# Patient Record
Sex: Male | Born: 1935
Health system: Southern US, Community
[De-identification: ages and names within clinical notes are randomized; demographics above are authoritative.]

## PROBLEM LIST (undated history)

## (undated) DIAGNOSIS — I10 Essential (primary) hypertension: Secondary | ICD-10-CM

## (undated) DIAGNOSIS — K219 Gastro-esophageal reflux disease without esophagitis: Secondary | ICD-10-CM

## (undated) DIAGNOSIS — F0390 Unspecified dementia without behavioral disturbance: Secondary | ICD-10-CM

## (undated) DIAGNOSIS — C8313 Mantle cell lymphoma, intra-abdominal lymph nodes: Principal | ICD-10-CM

## (undated) DIAGNOSIS — R319 Hematuria, unspecified: Principal | ICD-10-CM

## (undated) HISTORY — DX: Essential (primary) hypertension: I10

## (undated) HISTORY — DX: Unspecified dementia, unspecified severity, without behavioral disturbance, psychotic disturbance, mood disturbance, and anxiety: F03.90

## (undated) HISTORY — DX: Gastro-esophageal reflux disease without esophagitis: K21.9

## (undated) HISTORY — DX: Mantle cell lymphoma, intra-abdominal lymph nodes: C83.13

## (undated) HISTORY — DX: Hematuria, unspecified: R31.9

---

## 1998-03-18 ENCOUNTER — Other Ambulatory Visit: Admission: RE | Admit: 1998-03-18 | Discharge: 1998-03-18 | Payer: Self-pay | Admitting: Family Medicine

## 2006-11-26 HISTORY — PX: REPLACEMENT TOTAL KNEE: SUR1224

## 2011-06-15 ENCOUNTER — Encounter: Payer: Self-pay | Admitting: Oncology

## 2011-07-25 ENCOUNTER — Other Ambulatory Visit: Payer: Self-pay | Admitting: Oncology

## 2011-07-25 ENCOUNTER — Encounter (HOSPITAL_BASED_OUTPATIENT_CLINIC_OR_DEPARTMENT_OTHER): Payer: Medicare Other | Admitting: Oncology

## 2011-07-25 ENCOUNTER — Encounter: Payer: No Typology Code available for payment source | Admitting: Oncology

## 2011-07-25 DIAGNOSIS — N400 Enlarged prostate without lower urinary tract symptoms: Secondary | ICD-10-CM

## 2011-07-25 DIAGNOSIS — C8319 Mantle cell lymphoma, extranodal and solid organ sites: Secondary | ICD-10-CM

## 2011-07-25 LAB — CBC WITH DIFFERENTIAL/PLATELET
BASO%: 0.4 % (ref 0.0–2.0)
HCT: 43.2 % (ref 38.4–49.9)
LYMPH%: 22.4 % (ref 14.0–49.0)
MCH: 33.3 pg (ref 27.2–33.4)
MCHC: 35.3 g/dL (ref 32.0–36.0)
MCV: 94.5 fL (ref 79.3–98.0)
MONO#: 0.4 10*3/uL (ref 0.1–0.9)
MONO%: 7.2 % (ref 0.0–14.0)
NEUT%: 68.9 % (ref 39.0–75.0)
Platelets: 135 10*3/uL — ABNORMAL LOW (ref 140–400)

## 2011-07-25 LAB — MORPHOLOGY: PLT EST: DECREASED

## 2011-07-26 LAB — COMPREHENSIVE METABOLIC PANEL
AST: 19 U/L (ref 0–37)
BUN: 19 mg/dL (ref 6–23)
Calcium: 8.6 mg/dL (ref 8.4–10.5)
Chloride: 105 mEq/L (ref 96–112)
Creatinine, Ser: 0.98 mg/dL (ref 0.50–1.35)

## 2011-07-26 LAB — LACTATE DEHYDROGENASE: LDH: 141 U/L (ref 94–250)

## 2011-10-02 LAB — COMPREHENSIVE METABOLIC PANEL
ALT: 15 U/L (ref 0–53)
CO2: 28 mEq/L (ref 19–32)
Calcium: 8.6 mg/dL (ref 8.4–10.5)
Chloride: 105 mEq/L (ref 96–112)
Creatinine, Ser: 0.98 mg/dL (ref 0.50–1.35)
Total Protein: 5.8 g/dL — ABNORMAL LOW (ref 6.0–8.3)

## 2011-10-02 LAB — LACTATE DEHYDROGENASE: LDH: 141 U/L (ref 94–250)

## 2011-10-02 LAB — URIC ACID: Uric Acid, Serum: 5.5 mg/dL (ref 4.0–7.8)

## 2011-12-10 ENCOUNTER — Telehealth: Payer: Self-pay | Admitting: Oncology

## 2011-12-10 NOTE — Telephone Encounter (Signed)
S/w the pt's dtr and she is aware of the April 2013 appts.

## 2011-12-13 ENCOUNTER — Telehealth: Payer: Self-pay | Admitting: Oncology

## 2011-12-13 ENCOUNTER — Telehealth: Payer: Self-pay | Admitting: *Deleted

## 2011-12-13 ENCOUNTER — Other Ambulatory Visit: Payer: Self-pay | Admitting: Oncology

## 2011-12-13 DIAGNOSIS — C8319 Mantle cell lymphoma, extranodal and solid organ sites: Secondary | ICD-10-CM

## 2011-12-13 NOTE — Telephone Encounter (Signed)
Talked to pt's wife gave her appt for lab and CT, requested her to pick up oral contrast before Ct. Also informed pt's to be NPO 4 hrs prior to scan. Then pt will see MD after a few days

## 2011-12-13 NOTE — Telephone Encounter (Signed)
Received call from pt's wife on 12/11/11 but unable to find pt in computer b/c didn't have correct spelling on name.  She called back today & states that someone called them with appts for Aug. But she thinks the pt needs some x-rays.  They were supposed to have CT scan but stated that she went to radiology & someone told them that it couldn't be done b/c it wasn't ordered in the new system.  Discussed with Rose/Scheduler & she will get this scheduled before MD appt & call pt.

## 2012-01-31 DIAGNOSIS — C8589 Other specified types of non-Hodgkin lymphoma, extranodal and solid organ sites: Secondary | ICD-10-CM | POA: Diagnosis not present

## 2012-01-31 DIAGNOSIS — M549 Dorsalgia, unspecified: Secondary | ICD-10-CM | POA: Diagnosis not present

## 2012-02-27 ENCOUNTER — Other Ambulatory Visit (HOSPITAL_BASED_OUTPATIENT_CLINIC_OR_DEPARTMENT_OTHER): Payer: Medicare Other | Admitting: Lab

## 2012-02-27 ENCOUNTER — Ambulatory Visit (HOSPITAL_COMMUNITY)
Admission: RE | Admit: 2012-02-27 | Discharge: 2012-02-27 | Disposition: A | Discharge status: Home / Self Care | Payer: Medicare Other | Source: Ambulatory Visit | Attending: Oncology | Admitting: Oncology

## 2012-02-27 DIAGNOSIS — C8319 Mantle cell lymphoma, extranodal and solid organ sites: Secondary | ICD-10-CM

## 2012-02-27 DIAGNOSIS — K449 Diaphragmatic hernia without obstruction or gangrene: Secondary | ICD-10-CM | POA: Diagnosis not present

## 2012-02-27 DIAGNOSIS — I2510 Atherosclerotic heart disease of native coronary artery without angina pectoris: Secondary | ICD-10-CM | POA: Insufficient documentation

## 2012-02-27 DIAGNOSIS — K5730 Diverticulosis of large intestine without perforation or abscess without bleeding: Secondary | ICD-10-CM | POA: Diagnosis not present

## 2012-02-27 DIAGNOSIS — K7689 Other specified diseases of liver: Secondary | ICD-10-CM | POA: Diagnosis not present

## 2012-02-27 DIAGNOSIS — I7090 Unspecified atherosclerosis: Secondary | ICD-10-CM | POA: Diagnosis not present

## 2012-02-27 LAB — CMP (CANCER CENTER ONLY)
ALT(SGPT): 26 U/L (ref 10–47)
Albumin: 3.7 g/dL (ref 3.3–5.5)
CO2: 30 mEq/L (ref 18–33)
Calcium: 8.7 mg/dL (ref 8.0–10.3)
Chloride: 102 mEq/L (ref 98–108)
Glucose, Bld: 96 mg/dL (ref 73–118)
Potassium: 4.4 mEq/L (ref 3.3–4.7)
Sodium: 145 mEq/L (ref 128–145)
Total Protein: 6.8 g/dL (ref 6.4–8.1)

## 2012-02-27 LAB — CBC WITH DIFFERENTIAL/PLATELET
Basophils Absolute: 0 10*3/uL (ref 0.0–0.1)
Eosinophils Absolute: 0.1 10*3/uL (ref 0.0–0.5)
HGB: 15.8 g/dL (ref 13.0–17.1)
MCV: 93.7 fL (ref 79.3–98.0)
MONO#: 0.4 10*3/uL (ref 0.1–0.9)
MONO%: 8.2 % (ref 0.0–14.0)
NEUT#: 3.3 10*3/uL (ref 1.5–6.5)
RDW: 13.5 % (ref 11.0–14.6)
WBC: 4.6 10*3/uL (ref 4.0–10.3)
lymph#: 0.8 10*3/uL — ABNORMAL LOW (ref 0.9–3.3)

## 2012-02-27 MED ORDER — IOHEXOL 300 MG/ML  SOLN
100.0000 mL | Freq: Once | INTRAMUSCULAR | Status: AC | PRN
  Administered 2012-02-27: 100 mL via INTRAVENOUS

## 2012-02-28 ENCOUNTER — Telehealth: Payer: Self-pay

## 2012-02-28 LAB — LACTATE DEHYDROGENASE: LDH: 178 U/L (ref 94–250)

## 2012-02-28 LAB — SEDIMENTATION RATE: Sed Rate: 1 mm/hr (ref 0–16)

## 2012-02-28 NOTE — Telephone Encounter (Signed)
Message copied by Johny Drilling on Thu Feb 28, 2012  3:26 PM ------      Message from: Annia Belt      Created: Wed Feb 27, 2012  4:38 PM       Call pt CT negative for recurrent lymphoma

## 2012-02-28 NOTE — Telephone Encounter (Signed)
Pt notified of CT results per Dr Beryle Beams.  Pt expresses appreciation. dph

## 2012-03-04 ENCOUNTER — Encounter: Payer: Self-pay | Admitting: Oncology

## 2012-03-04 ENCOUNTER — Other Ambulatory Visit: Payer: No Typology Code available for payment source | Admitting: Lab

## 2012-03-04 ENCOUNTER — Ambulatory Visit (HOSPITAL_BASED_OUTPATIENT_CLINIC_OR_DEPARTMENT_OTHER): Payer: Medicare Other | Admitting: Oncology

## 2012-03-04 VITALS — BP 121/72 | HR 64 | Temp 97.1°F | Ht 71.5 in | Wt 161.7 lb

## 2012-03-04 DIAGNOSIS — C8313 Mantle cell lymphoma, intra-abdominal lymph nodes: Secondary | ICD-10-CM | POA: Diagnosis not present

## 2012-03-04 HISTORY — DX: Mantle cell lymphoma, intra-abdominal lymph nodes: C83.13

## 2012-03-04 NOTE — Progress Notes (Signed)
Hematology and Oncology Follow Up Visit  Jeffrey Simmons [id number] October 21, 1927 78 y.o. 03/04/2012 4:11 PM   Principle Diagnosis: Encounter Diagnosis  Name Primary?  . Mantle cell lymphoma of intra-abdominal lymph nodes Yes     Interim History:   Followup visit for this pleasant 78 year old man with mantle cell lymphoma in remission. Initial diagnosis in February of 2010 when he presented with increasing fatigue and abdominal fullness. CT scan showed splenomegaly, periportal lymphadenopathy, bilateral iliac adenopathy, and a dominant lymph node mass in the right pelvis adjacent to the right external iliac vessels measuring 4.8 x 2.3 cm. Additional right inguinal adenopathy 2.9 x 2.6 cm. Diagnosis established by right inguinal lymph node biopsy done 02/01/2009 which showed a CD5 positive large B-cell lymphoma CD20 positive CD5 and CD10 positive lambda light chain restriction. Bone marrow with atypical nodular lymphoid aggregates. Cytogenetic studies did show a population of cells with the 11; 14 translocation consistent with mantle cell lymphoma. He was living in [place] at the time. He was treated with 4 cycles of Cytoxan vincristine prednisone and Rituxan followed by 4 cycles of Cytoxan Novantrone vincristine and prednisone through 07/12/2009. He achieved a complete response. He continues to do well. He has no constitutional symptoms. No interim infections. He has not noted any new areas of adenopathy.   Medications: reviewed  Allergies: No Known Allergies  Review of Systems: Constitutional:   See above Respiratory: No cough or dyspnea Cardiovascular:  No chest pain or palpitations Gastrointestinal: No change in bowel habit Genito-Urinary: Not questioned Musculoskeletal: No pain Neurologic no headache or change in vision: Skin: No rash Remaining ROS negative.  Physical Exam: Blood pressure 121/72, pulse 64, temperature 97.1 F (36.2 C), temperature source Oral, height 5'  11.5" (1.816 m), weight 161 lb 11.2 oz (73.347 kg). Wt Readings from Last 3 Encounters:  03/04/12 161 lb 11.2 oz (73.347 kg)     General appearance: A thin Caucasian man [name]: Pharynx no erythema exudate or mass Lymph nodes: No cervical supraclavicular axillary or inguinal adenopathy Breasts: Lungs: Clear to auscultation resonant to percussion Heart: Regular rhythm no murmur or gallop Abdomen: Soft nontender no mass no organomegaly Extremities: No edema no calf tenderness Vascular: No cyanosis Neurologic: Motor strength 5 over 5, reflexes 1+ symmetric, sensation intact to vibration over the fingertips Skin: No rash or ecchymoses  Lab Results: Lab Results  Component Value Date   WBC 4.6 02/27/2012   HGB 15.8 02/27/2012   HCT 45.7 02/27/2012   MCV 93.7 02/27/2012   PLT 123* 02/27/2012     Chemistry      Component Value Date/Time   NA 145 02/27/2012 1158   NA 141 07/25/2011 1438   NA 141 07/25/2011 1438   NA 141 07/25/2011 1438   NA 141 07/25/2011 1438   K 4.4 02/27/2012 1158   K 4.3 07/25/2011 1438   K 4.3 07/25/2011 1438   K 4.3 07/25/2011 1438   K 4.3 07/25/2011 1438   CL 102 02/27/2012 1158   CL 105 07/25/2011 1438   CL 105 07/25/2011 1438   CL 105 07/25/2011 1438   CL 105 07/25/2011 1438   CO2 30 02/27/2012 1158   CO2 28 07/25/2011 1438   CO2 28 07/25/2011 1438   CO2 28 07/25/2011 1438   CO2 28 07/25/2011 1438   BUN 14 02/27/2012 1158   BUN 19 07/25/2011 1438   BUN 19 07/25/2011 1438   BUN 19 07/25/2011 1438   BUN 19 07/25/2011 1438   CREATININE  1.0 02/27/2012 1158   CREATININE 0.98 07/25/2011 1438   CREATININE 0.98 07/25/2011 1438   CREATININE 0.98 07/25/2011 1438   CREATININE 0.98 07/25/2011 1438      Component Value Date/Time   CALCIUM 8.7 02/27/2012 1158   CALCIUM 8.6 07/25/2011 1438   CALCIUM 8.6 07/25/2011 1438   CALCIUM 8.6 07/25/2011 1438   CALCIUM 8.6 07/25/2011 1438   ALKPHOS 105* 02/27/2012 1158   ALKPHOS 88 07/25/2011 1438   ALKPHOS 88 07/25/2011 1438   ALKPHOS 88 07/25/2011 1438    ALKPHOS 88 07/25/2011 1438   AST 33 02/27/2012 1158   AST 19 07/25/2011 1438   AST 19 07/25/2011 1438   AST 19 07/25/2011 1438   AST 19 07/25/2011 1438   ALT 15 07/25/2011 1438   ALT 15 07/25/2011 1438   ALT 15 07/25/2011 1438   ALT 15 07/25/2011 1438   BILITOT 1.10 02/27/2012 1158   BILITOT 1.0 07/25/2011 1438   BILITOT 1.0 07/25/2011 1438   BILITOT 1.0 07/25/2011 1438   BILITOT 1.0 07/25/2011 1438       Radiological Studies: CT scans chest abdomen and pelvis which I personally reviewed done 02/27/2012 show no evidence for recurrent lymphoma   Impression and Plan: Stage IV mantle cell lymphoma treated as outlined above he remains in a complete clinical and radiographic remission now out 3 years from diagnosis Plan continued periodic followup.   CC:. Dr. Jori Moll to light; Dr. Dana Allan fax [phone]   Annia Belt, MD 4/9/20134:11 PM

## 2012-03-05 ENCOUNTER — Telehealth: Payer: Self-pay | Admitting: Oncology

## 2012-03-05 NOTE — Telephone Encounter (Signed)
gv pt appt for oct2013. pt question chest xray for oct if he needs it.  sen t email to MD for confirmation

## 2012-03-19 DIAGNOSIS — Q828 Other specified congenital malformations of skin: Secondary | ICD-10-CM | POA: Diagnosis not present

## 2012-03-19 DIAGNOSIS — L84 Corns and callosities: Secondary | ICD-10-CM | POA: Diagnosis not present

## 2012-04-18 DIAGNOSIS — H353190 Nonexudative age-related macular degeneration, unspecified eye, stage unspecified: Secondary | ICD-10-CM | POA: Diagnosis not present

## 2012-04-18 DIAGNOSIS — H35379 Puckering of macula, unspecified eye: Secondary | ICD-10-CM | POA: Diagnosis not present

## 2012-04-18 DIAGNOSIS — Z961 Presence of intraocular lens: Secondary | ICD-10-CM | POA: Diagnosis not present

## 2012-05-05 DIAGNOSIS — R413 Other amnesia: Secondary | ICD-10-CM | POA: Diagnosis not present

## 2012-05-05 DIAGNOSIS — Z0000 Encounter for general adult medical examination without abnormal findings: Secondary | ICD-10-CM | POA: Diagnosis not present

## 2012-05-05 DIAGNOSIS — C8589 Other specified types of non-Hodgkin lymphoma, extranodal and solid organ sites: Secondary | ICD-10-CM | POA: Diagnosis not present

## 2012-05-12 DIAGNOSIS — R413 Other amnesia: Secondary | ICD-10-CM | POA: Diagnosis not present

## 2012-05-21 DIAGNOSIS — L84 Corns and callosities: Secondary | ICD-10-CM | POA: Diagnosis not present

## 2012-05-21 DIAGNOSIS — M216X9 Other acquired deformities of unspecified foot: Secondary | ICD-10-CM | POA: Diagnosis not present

## 2012-06-03 DIAGNOSIS — F068 Other specified mental disorders due to known physiological condition: Secondary | ICD-10-CM | POA: Diagnosis not present

## 2012-06-09 DIAGNOSIS — F068 Other specified mental disorders due to known physiological condition: Secondary | ICD-10-CM | POA: Diagnosis not present

## 2012-06-24 DIAGNOSIS — F0390 Unspecified dementia without behavioral disturbance: Secondary | ICD-10-CM | POA: Diagnosis not present

## 2012-06-24 DIAGNOSIS — R413 Other amnesia: Secondary | ICD-10-CM | POA: Diagnosis not present

## 2012-08-26 ENCOUNTER — Other Ambulatory Visit (HOSPITAL_BASED_OUTPATIENT_CLINIC_OR_DEPARTMENT_OTHER): Payer: Medicare Other

## 2012-08-26 DIAGNOSIS — C8313 Mantle cell lymphoma, intra-abdominal lymph nodes: Secondary | ICD-10-CM | POA: Diagnosis not present

## 2012-08-26 LAB — COMPREHENSIVE METABOLIC PANEL (CC13)
AST: 18 U/L (ref 5–34)
Albumin: 3.7 g/dL (ref 3.5–5.0)
BUN: 15 mg/dL (ref 7.0–26.0)
Calcium: 9.2 mg/dL (ref 8.4–10.4)
Chloride: 106 mEq/L (ref 98–107)
Creatinine: 1 mg/dL (ref 0.7–1.3)
Glucose: 87 mg/dl (ref 70–99)
Potassium: 4.2 mEq/L (ref 3.5–5.1)

## 2012-08-26 LAB — CBC WITH DIFFERENTIAL/PLATELET
BASO%: 0.7 % (ref 0.0–2.0)
EOS%: 1.8 % (ref 0.0–7.0)
MCH: 33 pg (ref 27.2–33.4)
MCHC: 35 g/dL (ref 32.0–36.0)
MCV: 94.2 fL (ref 79.3–98.0)
MONO%: 6.7 % (ref 0.0–14.0)
RDW: 13.7 % (ref 11.0–14.6)
lymph#: 1.1 10*3/uL (ref 0.9–3.3)

## 2012-08-26 LAB — MORPHOLOGY: PLT EST: DECREASED

## 2012-09-02 ENCOUNTER — Ambulatory Visit (HOSPITAL_BASED_OUTPATIENT_CLINIC_OR_DEPARTMENT_OTHER): Payer: Medicare Other | Admitting: Oncology

## 2012-09-02 ENCOUNTER — Telehealth: Payer: Self-pay | Admitting: Oncology

## 2012-09-02 VITALS — BP 112/62 | HR 55 | Temp 97.0°F | Resp 20 | Ht 71.5 in | Wt 164.7 lb

## 2012-09-02 DIAGNOSIS — C8313 Mantle cell lymphoma, intra-abdominal lymph nodes: Secondary | ICD-10-CM | POA: Diagnosis not present

## 2012-09-02 NOTE — Progress Notes (Signed)
Hematology and Oncology Follow Up Visit  Jeffrey Simmons [id number] [birth year] 78 y.o. 09/02/2012 4:12 PM   Principle Diagnosis: Encounter Diagnosis  Name Primary?  . Mantle cell lymphoma of intra-abdominal lymph nodes Yes     Interim History:    Followup visit for this pleasant 78 year old man with mantle cell lymphoma in remission. Initial diagnosis in February of 2010 when he presented with increasing fatigue and abdominal fullness. CT scan showed splenomegaly, periportal lymphadenopathy, bilateral iliac adenopathy, and a dominant lymph node mass in the right pelvis adjacent to the right external iliac vessels measuring 4.8 x 2.3 cm. Additional right inguinal adenopathy 2.9 x 2.6 cm. Diagnosis established by right inguinal lymph node biopsy done 02/01/2009 which showed a CD5 positive large B-cell lymphoma CD20 positive CD5 and CD10 positive lambda light chain restriction. Bone marrow with atypical nodular lymphoid aggregates. Cytogenetic studies did show a population of cells with the 11; 14 translocation consistent with mantle cell lymphoma. He was living in [place] at the time. He was treated with 4 cycles of Cytoxan vincristine prednisone and Rituxan followed by 4 cycles of Cytoxan Novantrone vincristine and prednisone through 07/12/2009. He achieved a complete response which has been durable to date. Most recent CT scan done here on 02/27/2012 showed ongoing remission status.  History well at this time. He has had no interim medical problems. Appetite is good. Weight is steady. He gets occasional constipation. No abdominal pain or cramping. No hematochezia or melena.  Medications: reviewed  Allergies: No Known Allergies  Review of Systems: Constitutional:  No constitutional symptoms  Respiratory: No cough or dyspnea Cardiovascular:  No chest pain or palpitations Gastrointestinal: See above Genito-Urinary: No urinary tract symptoms Musculoskeletal: No muscle or bone  pain Neurologic: No headache or change in vision Skin: No rash or ecchymosis Remaining ROS negative.  Physical Exam: Blood pressure 112/62, pulse 55, temperature 97 F (36.1 C), temperature source Oral, resp. rate 20, height 5' 11.5" (1.816 m), weight 164 lb 11.2 oz (74.707 kg). Wt Readings from Last 3 Encounters:  09/02/12 164 lb 11.2 oz (74.707 kg)  03/04/12 161 lb 11.2 oz (73.347 kg)     General appearance: Thin but adequately nourished Caucasian man [name]: Pharynx no erythema or exudate Lymph nodes: No cervical, supraclavicular, axillary, or inguinal adenopathy Breasts: Lungs: Clear to auscultation resonant to percussion Heart: Regular rhythm no murmur Abdomen: Soft, nontender, no mass, no organomegaly Extremities: No edema, no calf tenderness Vascular: No cyanosis Neurologic: Motor strength 5 over 5, reflexes 1+ symmetric Skin: No rash or ecchymosis  Lab Results: Lab Results  Component Value Date   WBC 4.7 08/26/2012   HGB 15.7 08/26/2012   HCT 44.8 08/26/2012   MCV 94.2 08/26/2012   PLT 106* 08/26/2012     Chemistry      Component Value Date/Time   NA 142 08/26/2012 1021   NA 145 02/27/2012 1158   NA 141 07/25/2011 1438   NA 141 07/25/2011 1438   NA 141 07/25/2011 1438   NA 141 07/25/2011 1438   K 4.2 08/26/2012 1021   K 4.4 02/27/2012 1158   K 4.3 07/25/2011 1438   K 4.3 07/25/2011 1438   K 4.3 07/25/2011 1438   K 4.3 07/25/2011 1438   CL 106 08/26/2012 1021   CL 102 02/27/2012 1158   CL 105 07/25/2011 1438   CL 105 07/25/2011 1438   CL 105 07/25/2011 1438   CL 105 07/25/2011 1438   CO2 27 08/26/2012 1021   CO2  30 02/27/2012 1158   CO2 28 07/25/2011 1438   CO2 28 07/25/2011 1438   CO2 28 07/25/2011 1438   CO2 28 07/25/2011 1438   BUN 15.0 08/26/2012 1021   BUN 14 02/27/2012 1158   BUN 19 07/25/2011 1438   BUN 19 07/25/2011 1438   BUN 19 07/25/2011 1438   BUN 19 07/25/2011 1438   CREATININE 1.0 08/26/2012 1021   CREATININE 1.0 02/27/2012 1158   CREATININE 0.98 07/25/2011 1438    CREATININE 0.98 07/25/2011 1438   CREATININE 0.98 07/25/2011 1438   CREATININE 0.98 07/25/2011 1438      Component Value Date/Time   CALCIUM 9.2 08/26/2012 1021   CALCIUM 8.7 02/27/2012 1158   CALCIUM 8.6 07/25/2011 1438   CALCIUM 8.6 07/25/2011 1438   CALCIUM 8.6 07/25/2011 1438   CALCIUM 8.6 07/25/2011 1438   ALKPHOS 103 08/26/2012 1021   ALKPHOS 105* 02/27/2012 1158   ALKPHOS 88 07/25/2011 1438   ALKPHOS 88 07/25/2011 1438   ALKPHOS 88 07/25/2011 1438   ALKPHOS 88 07/25/2011 1438   AST 18 08/26/2012 1021   AST 33 02/27/2012 1158   AST 19 07/25/2011 1438   AST 19 07/25/2011 1438   AST 19 07/25/2011 1438   AST 19 07/25/2011 1438   ALT 16 08/26/2012 1021   ALT 15 07/25/2011 1438   ALT 15 07/25/2011 1438   ALT 15 07/25/2011 1438   ALT 15 07/25/2011 1438   BILITOT 0.80 08/26/2012 1021   BILITOT 1.10 02/27/2012 1158   BILITOT 1.0 07/25/2011 1438   BILITOT 1.0 07/25/2011 1438   BILITOT 1.0 07/25/2011 1438   BILITOT 1.0 07/25/2011 1438       Radiological Studies: Not done prior to this visit  Impression and Plan: Mantle cell lymphoma treated as outlined above he remains clinically free of any recurrence now out 3-1/2 years from diagnosis. I will see him again in 6 months and get a chest radiograph and a CT scan abdomen and pelvis at that time.  CC:. Dr. Seward Carol; Dr. Dana Allan fax [phone]   Annia Belt, MD 10/8/20134:12 PM

## 2012-09-02 NOTE — Telephone Encounter (Signed)
appts made and printed for pt aom °

## 2012-09-10 DIAGNOSIS — F068 Other specified mental disorders due to known physiological condition: Secondary | ICD-10-CM | POA: Diagnosis not present

## 2012-09-23 DIAGNOSIS — Z23 Encounter for immunization: Secondary | ICD-10-CM | POA: Diagnosis not present

## 2012-09-24 DIAGNOSIS — R413 Other amnesia: Secondary | ICD-10-CM | POA: Diagnosis not present

## 2012-10-27 DIAGNOSIS — H353190 Nonexudative age-related macular degeneration, unspecified eye, stage unspecified: Secondary | ICD-10-CM | POA: Diagnosis not present

## 2012-10-27 DIAGNOSIS — Z961 Presence of intraocular lens: Secondary | ICD-10-CM | POA: Diagnosis not present

## 2012-10-27 DIAGNOSIS — H35379 Puckering of macula, unspecified eye: Secondary | ICD-10-CM | POA: Diagnosis not present

## 2012-10-27 DIAGNOSIS — H43819 Vitreous degeneration, unspecified eye: Secondary | ICD-10-CM | POA: Diagnosis not present

## 2012-11-03 DIAGNOSIS — M216X9 Other acquired deformities of unspecified foot: Secondary | ICD-10-CM | POA: Diagnosis not present

## 2012-11-04 DIAGNOSIS — D235 Other benign neoplasm of skin of trunk: Secondary | ICD-10-CM | POA: Diagnosis not present

## 2012-11-07 DIAGNOSIS — H35379 Puckering of macula, unspecified eye: Secondary | ICD-10-CM | POA: Diagnosis not present

## 2013-02-24 ENCOUNTER — Ambulatory Visit (HOSPITAL_COMMUNITY)
Admission: RE | Admit: 2013-02-24 | Discharge: 2013-02-24 | Disposition: A | Discharge status: Home / Self Care | Payer: Medicare Other | Source: Ambulatory Visit | Attending: Oncology | Admitting: Oncology

## 2013-02-24 ENCOUNTER — Other Ambulatory Visit: Payer: Self-pay | Admitting: Oncology

## 2013-02-24 ENCOUNTER — Other Ambulatory Visit (HOSPITAL_BASED_OUTPATIENT_CLINIC_OR_DEPARTMENT_OTHER): Payer: Medicare Other | Admitting: Lab

## 2013-02-24 DIAGNOSIS — I7090 Unspecified atherosclerosis: Secondary | ICD-10-CM | POA: Diagnosis not present

## 2013-02-24 DIAGNOSIS — C8313 Mantle cell lymphoma, intra-abdominal lymph nodes: Secondary | ICD-10-CM

## 2013-02-24 DIAGNOSIS — K5730 Diverticulosis of large intestine without perforation or abscess without bleeding: Secondary | ICD-10-CM | POA: Insufficient documentation

## 2013-02-24 DIAGNOSIS — Z923 Personal history of irradiation: Secondary | ICD-10-CM | POA: Insufficient documentation

## 2013-02-24 DIAGNOSIS — K449 Diaphragmatic hernia without obstruction or gangrene: Secondary | ICD-10-CM | POA: Diagnosis not present

## 2013-02-24 DIAGNOSIS — C8589 Other specified types of non-Hodgkin lymphoma, extranodal and solid organ sites: Secondary | ICD-10-CM | POA: Diagnosis not present

## 2013-02-24 DIAGNOSIS — Z79899 Other long term (current) drug therapy: Secondary | ICD-10-CM | POA: Diagnosis not present

## 2013-02-24 DIAGNOSIS — K7689 Other specified diseases of liver: Secondary | ICD-10-CM | POA: Diagnosis not present

## 2013-02-24 LAB — COMPREHENSIVE METABOLIC PANEL (CC13)
Albumin: 4 g/dL (ref 3.5–5.0)
Alkaline Phosphatase: 115 U/L (ref 40–150)
BUN: 18.7 mg/dL (ref 7.0–26.0)
CO2: 30 mEq/L — ABNORMAL HIGH (ref 22–29)
Calcium: 9.5 mg/dL (ref 8.4–10.4)
Chloride: 103 mEq/L (ref 98–107)
Glucose: 98 mg/dl (ref 70–99)
Potassium: 4.2 mEq/L (ref 3.5–5.1)
Sodium: 142 mEq/L (ref 136–145)
Total Protein: 6.8 g/dL (ref 6.4–8.3)

## 2013-02-24 LAB — CBC WITH DIFFERENTIAL/PLATELET
Basophils Absolute: 0 10*3/uL (ref 0.0–0.1)
Eosinophils Absolute: 0.1 10*3/uL (ref 0.0–0.5)
HGB: 16.7 g/dL (ref 13.0–17.1)
MCV: 92.9 fL (ref 79.3–98.0)
MONO#: 0.4 10*3/uL (ref 0.1–0.9)
NEUT#: 3.7 10*3/uL (ref 1.5–6.5)
RBC: 5.24 10*6/uL (ref 4.20–5.82)
RDW: 13.5 % (ref 11.0–14.6)
WBC: 5.5 10*3/uL (ref 4.0–10.3)
lymph#: 1.2 10*3/uL (ref 0.9–3.3)

## 2013-02-24 LAB — LACTATE DEHYDROGENASE (CC13): LDH: 190 U/L (ref 125–245)

## 2013-02-24 MED ORDER — IOHEXOL 300 MG/ML  SOLN
100.0000 mL | Freq: Once | INTRAMUSCULAR | Status: AC | PRN
  Administered 2013-02-24: 100 mL via INTRAVENOUS

## 2013-02-25 ENCOUNTER — Telehealth: Payer: Self-pay | Admitting: *Deleted

## 2013-02-25 NOTE — Telephone Encounter (Signed)
, °

## 2013-02-25 NOTE — Telephone Encounter (Signed)
Spoke with wife as patient is outside.  Let her know that CT and CXR were normal - no evidence for recurrent lymphoma.  She was very appreciative of the call and knows to keep appt. On 4/8

## 2013-02-25 NOTE — Telephone Encounter (Signed)
Message copied by Ignacia Felling on Wed Feb 25, 2013 10:17 AM ------      Message from: Annia Belt      Created: Wed Feb 25, 2013  8:06 AM       Call pt CT & CXR normal - no evidence for recurrent lymphoma ------

## 2013-03-03 ENCOUNTER — Telehealth: Payer: Self-pay | Admitting: Oncology

## 2013-03-03 ENCOUNTER — Ambulatory Visit (HOSPITAL_BASED_OUTPATIENT_CLINIC_OR_DEPARTMENT_OTHER): Payer: Medicare Other | Admitting: Oncology

## 2013-03-03 VITALS — BP 125/64 | HR 66 | Temp 97.6°F | Resp 18 | Ht 71.5 in | Wt 165.6 lb

## 2013-03-03 DIAGNOSIS — C8313 Mantle cell lymphoma, intra-abdominal lymph nodes: Secondary | ICD-10-CM

## 2013-03-03 NOTE — Progress Notes (Signed)
Hematology and Oncology Follow Up Visit  Jeffrey Simmons [id number] [birth year] 79 y.o. 03/03/2013 7:35 PM   Principle Diagnosis: Encounter Diagnosis  Name Primary?  . Mantle cell lymphoma of intra-abdominal lymph nodes Yes     Interim History:   Followup visit for this pleasant 79 year old man with mantle cell lymphoma in remission. Initial diagnosis in February of 2010 when he presented with increasing fatigue and abdominal fullness. CT scan showed splenomegaly, periportal lymphadenopathy, bilateral iliac adenopathy, and a dominant lymph node mass in the right pelvis adjacent to the right external iliac vessels measuring 4.8 x 2.3 cm. Additional right inguinal adenopathy 2.9 x 2.6 cm. Diagnosis established by right inguinal lymph node biopsy done 02/01/2009 which showed a CD5 positive large B-cell lymphoma CD20 positive CD5 and CD10 positive lambda light chain restriction. Bone marrow with atypical nodular lymphoid aggregates. Cytogenetic studies did show a population of cells with the 11;14  translocation consistent with mantle cell lymphoma. He was living in [place] at the time. He was treated with 4 cycles of Cytoxan vincristine prednisone and Rituxan followed by 4 cycles of Cytoxan Novantrone vincristine and prednisone through 07/12/2009. He achieved a complete response which has been durable to date. Scans done  in anticipation of today's visit on 02/24/2013, which I personally reviewed with him today, show no evidence for new disease. He has no symptoms at this time. No abdominal pain. No change in bowel habit.   Medications: reviewed  Allergies: No Known Allergies  Review of Systems: Constitutional:   No constitutional symptoms Respiratory: No cough or dyspnea Cardiovascular:  No chest pain or palpitations Gastrointestinal: No abdominal pain Genito-Urinary: No urinary tract symptoms Musculoskeletal: No muscle bone or joint pain Neurologic: No headache or change in  vision Skin: No rash Remaining ROS negative.  Physical Exam: Blood pressure 125/64, pulse 66, temperature 97.6 F (36.4 C), temperature source Oral, resp. rate 18, height 5' 11.5" (1.816 m), weight 165 lb 9.6 oz (75.116 kg). Wt Readings from Last 3 Encounters:  03/03/13 165 lb 9.6 oz (75.116 kg)  09/02/12 164 lb 11.2 oz (74.707 kg)  03/04/12 161 lb 11.2 oz (73.347 kg)     General appearance: Thin Caucasian man [name]: Pharynx no erythema or exudate Lymph nodes: No cervical, supraclavicular, axillary, or inguinal adenopathy Breasts: Lungs: Clear to auscultation resonant to percussion Heart: Regular rhythm no murmur Abdomen: Soft, nontender, no mass, no organomegaly Extremities: No edema, no calf tenderness Vascular: No cyanosis Neurologic: No focal deficit Skin: No rash or ecchymosis  Lab Results: Lab Results  Component Value Date   WBC 5.5 02/24/2013   HGB 16.7 02/24/2013   HCT 48.6 02/24/2013   MCV 92.9 02/24/2013   PLT 112* 02/24/2013     Chemistry      Component Value Date/Time   NA 142 02/24/2013 0923   NA 145 02/27/2012 1158   NA 141 07/25/2011 1438   NA 141 07/25/2011 1438   K 4.2 02/24/2013 0923   K 4.4 02/27/2012 1158   K 4.3 07/25/2011 1438   K 4.3 07/25/2011 1438   CL 103 02/24/2013 0923   CL 102 02/27/2012 1158   CL 105 07/25/2011 1438   CL 105 07/25/2011 1438   CO2 30* 02/24/2013 0923   CO2 30 02/27/2012 1158   CO2 28 07/25/2011 1438   CO2 28 07/25/2011 1438   BUN 18.7 02/24/2013 0923   BUN 14 02/27/2012 1158   BUN 19 07/25/2011 1438   BUN 19 07/25/2011 1438   CREATININE  1.2 02/24/2013 0923   CREATININE 1.0 02/27/2012 1158   CREATININE 0.98 07/25/2011 1438   CREATININE 0.98 07/25/2011 1438      Component Value Date/Time   CALCIUM 9.5 02/24/2013 0923   CALCIUM 8.7 02/27/2012 1158   CALCIUM 8.6 07/25/2011 1438   CALCIUM 8.6 07/25/2011 1438   ALKPHOS 115 02/24/2013 0923   ALKPHOS 105* 02/27/2012 1158   ALKPHOS 88 07/25/2011 1438   ALKPHOS 88 07/25/2011 1438   AST 24 02/24/2013 0923   AST 33  02/27/2012 1158   AST 19 07/25/2011 1438   AST 19 07/25/2011 1438   ALT 18 02/24/2013 0923   ALT 15 07/25/2011 1438   ALT 15 07/25/2011 1438   BILITOT 1.57* 02/24/2013 0923   BILITOT 1.10 02/27/2012 1158   BILITOT 1.0 07/25/2011 1438   BILITOT 1.0 07/25/2011 1438       Radiological Studies: Dg Chest 2 View  02/24/2013  *RADIOLOGY REPORT*  Clinical Data: Mantle cell lymphoma post treatment, former smoker  CHEST - 2 VIEW  Comparison: None Correlation:  CT chest 02/27/2012  Findings: Normal heart size and pulmonary vascularity. Tortuous aorta with atherosclerotic calcification. Lungs clear. No pleural effusion or pneumothorax. Bones unremarkable.  IMPRESSION: No acute abnormalities.   Original Report Authenticated By: Lavonia Dana, M.D.    Ct Abdomen Pelvis W Contrast  02/24/2013  *RADIOLOGY REPORT*  Clinical Data: History of lymphoma diagnosed in 2010.  Chemotherapy and radiation therapy complete.  CT ABDOMEN AND PELVIS WITH CONTRAST  Technique:  Multidetector CT imaging of the abdomen and pelvis was performed following the standard protocol during bolus administration of intravenous contrast.  Contrast: 162mL OMNIPAQUE IOHEXOL 300 MG/ML  SOLN  Comparison: CT of the chest abdomen and pelvis 02/27/2012.  Findings:  Lung Bases: Moderate sized hiatal hernia.  Otherwise, unremarkable.  Abdomen/Pelvis:  Multiple small low attenuation lesions scattered throughout the liver are generally too small to characterize, but are unchanged in size, number and distribution compared to the prior examination, with the largest lesion measuring 2.7 cm in diameter in segments seven; this largest lesion is compatible with a simple cyst.  2.9 x 1.8 cm hypervascular lesion in the central liver (image 21 of series 2) is nearly occult on delayed phase images, and is unchanged in retrospect compared to the prior examination, most compatible with a flash filled cavernous hemangioma.  The appearance of the pancreas, spleen, bilateral adrenal  glands and bilateral kidneys is unremarkable.  Numerous colonic diverticula are noted, without surrounding inflammatory changes to suggest acute diverticulitis.  Normal appendix. Mild atherosclerosis throughout the abdominal and pelvic vasculature, without definite aneurysm or dissection.  No significant volume of ascites.  No pneumoperitoneum.  No pathologic distension of small bowel.  No definite pathologic lymphadenopathy identified within the abdomen or pelvis.  Musculoskeletal: There are no aggressive appearing lytic or blastic lesions noted in the visualized portions of the skeleton.  IMPRESSION: 1.  No findings to suggest recurrence of disease in the abdomen or pelvis. 2.  Multiple liver lesions with benign imaging characteristics, similar to the prior examination, as above. 3.  Mild colonic diverticulosis without findings to suggest acute diverticulitis at this time. 4.  Moderate sized hiatal hernia. 5.  Atherosclerosis.   Original Report Authenticated By: Vinnie Langton, M.D.     Impression and Plan: #1. Stage IV mantle cell lymphoma treated as outlined above. He remains in a clinical and radiographic remission now out over 4 years from diagnosis. I will repeat scans again in 6 months and then  only on a when necessary basis. I updated him and his wife about recent developments in the field. Last month,  a first in class oral B cell receptor signaling inhibitor was approved- Ibrutinib. It has shown high responses in multiply relapsed patients. Last year Revlimid was given an additional approval in relapsed mantle cell lymphoma and Velcade also shows activity in the relapsed setting. Hopefully he will stay in remission but if he does have a relapse, we have multiple agents available that would be of benefit.   CC:.    Jeffrey Belt, MD 4/8/20147:36 PM

## 2013-03-03 NOTE — Telephone Encounter (Signed)
gv and printed appt schedule for pt for OCT...gv pt barium

## 2013-03-25 DIAGNOSIS — R413 Other amnesia: Secondary | ICD-10-CM | POA: Diagnosis not present

## 2013-03-25 DIAGNOSIS — C8589 Other specified types of non-Hodgkin lymphoma, extranodal and solid organ sites: Secondary | ICD-10-CM | POA: Diagnosis not present

## 2013-04-24 DIAGNOSIS — R42 Dizziness and giddiness: Secondary | ICD-10-CM | POA: Diagnosis not present

## 2013-05-01 DIAGNOSIS — R42 Dizziness and giddiness: Secondary | ICD-10-CM | POA: Diagnosis not present

## 2013-07-29 DIAGNOSIS — I959 Hypotension, unspecified: Secondary | ICD-10-CM | POA: Diagnosis not present

## 2013-07-29 DIAGNOSIS — R55 Syncope and collapse: Secondary | ICD-10-CM | POA: Diagnosis not present

## 2013-07-29 DIAGNOSIS — M25519 Pain in unspecified shoulder: Secondary | ICD-10-CM | POA: Diagnosis not present

## 2013-07-31 DIAGNOSIS — H3530 Unspecified macular degeneration: Secondary | ICD-10-CM | POA: Diagnosis not present

## 2013-08-03 DIAGNOSIS — R55 Syncope and collapse: Secondary | ICD-10-CM | POA: Diagnosis not present

## 2013-08-06 DIAGNOSIS — R55 Syncope and collapse: Secondary | ICD-10-CM | POA: Diagnosis not present

## 2013-08-06 DIAGNOSIS — I951 Orthostatic hypotension: Secondary | ICD-10-CM | POA: Diagnosis not present

## 2013-08-26 ENCOUNTER — Other Ambulatory Visit (HOSPITAL_BASED_OUTPATIENT_CLINIC_OR_DEPARTMENT_OTHER): Payer: Medicare Other

## 2013-08-26 ENCOUNTER — Ambulatory Visit (HOSPITAL_COMMUNITY)
Admission: RE | Admit: 2013-08-26 | Discharge: 2013-08-26 | Disposition: A | Discharge status: Home / Self Care | Payer: Medicare Other | Source: Ambulatory Visit | Attending: Oncology | Admitting: Oncology

## 2013-08-26 DIAGNOSIS — N281 Cyst of kidney, acquired: Secondary | ICD-10-CM | POA: Diagnosis not present

## 2013-08-26 DIAGNOSIS — K4090 Unilateral inguinal hernia, without obstruction or gangrene, not specified as recurrent: Secondary | ICD-10-CM | POA: Insufficient documentation

## 2013-08-26 DIAGNOSIS — I2510 Atherosclerotic heart disease of native coronary artery without angina pectoris: Secondary | ICD-10-CM | POA: Insufficient documentation

## 2013-08-26 DIAGNOSIS — K449 Diaphragmatic hernia without obstruction or gangrene: Secondary | ICD-10-CM | POA: Diagnosis not present

## 2013-08-26 DIAGNOSIS — C8319 Mantle cell lymphoma, extranodal and solid organ sites: Secondary | ICD-10-CM

## 2013-08-26 DIAGNOSIS — C8313 Mantle cell lymphoma, intra-abdominal lymph nodes: Secondary | ICD-10-CM | POA: Diagnosis not present

## 2013-08-26 DIAGNOSIS — K5730 Diverticulosis of large intestine without perforation or abscess without bleeding: Secondary | ICD-10-CM | POA: Insufficient documentation

## 2013-08-26 LAB — CBC WITH DIFFERENTIAL/PLATELET
BASO%: 0.8 % (ref 0.0–2.0)
EOS%: 2.5 % (ref 0.0–7.0)
HGB: 16.3 g/dL (ref 13.0–17.1)
MCH: 32.3 pg (ref 27.2–33.4)
MCHC: 34.7 g/dL (ref 32.0–36.0)
MCV: 93 fL (ref 79.3–98.0)
MONO%: 7.7 % (ref 0.0–14.0)
RBC: 5.03 10*6/uL (ref 4.20–5.82)
RDW: 13.7 % (ref 11.0–14.6)
lymph#: 0.9 10*3/uL (ref 0.9–3.3)

## 2013-08-26 LAB — COMPREHENSIVE METABOLIC PANEL (CC13)
ALT: 16 U/L (ref 0–55)
Alkaline Phosphatase: 97 U/L (ref 40–150)
CO2: 29 mEq/L (ref 22–29)
Creatinine: 1 mg/dL (ref 0.7–1.3)
Sodium: 143 mEq/L (ref 136–145)
Total Bilirubin: 1.38 mg/dL — ABNORMAL HIGH (ref 0.20–1.20)
Total Protein: 6.9 g/dL (ref 6.4–8.3)

## 2013-08-26 LAB — SEDIMENTATION RATE: Sed Rate: 1 mm/hr (ref 0–16)

## 2013-08-26 LAB — LACTATE DEHYDROGENASE (CC13): LDH: 204 U/L (ref 125–245)

## 2013-08-26 MED ORDER — IOHEXOL 300 MG/ML  SOLN
100.0000 mL | Freq: Once | INTRAMUSCULAR | Status: AC | PRN
  Administered 2013-08-26: 100 mL via INTRAVENOUS

## 2013-09-01 ENCOUNTER — Ambulatory Visit (HOSPITAL_BASED_OUTPATIENT_CLINIC_OR_DEPARTMENT_OTHER): Payer: Medicare Other | Admitting: Oncology

## 2013-09-01 VITALS — BP 124/67 | HR 66 | Temp 98.1°F | Resp 18 | Ht 71.5 in | Wt 161.8 lb

## 2013-09-01 DIAGNOSIS — C8313 Mantle cell lymphoma, intra-abdominal lymph nodes: Secondary | ICD-10-CM | POA: Diagnosis not present

## 2013-09-02 ENCOUNTER — Telehealth: Payer: Self-pay | Admitting: Oncology

## 2013-09-02 NOTE — Telephone Encounter (Signed)
Gave pt appt for lab,MD and CT for December and January 2014, advised pt to get an oral contrast

## 2013-09-02 NOTE — Progress Notes (Signed)
Hematology and Oncology Follow Up Visit  Jeffrey Simmons [id number] 1927-02-06 79 y.o. 09/02/2013 6:22 PM   Principle Diagnosis: Encounter Diagnosis  Name Primary?  . Mantle cell lymphoma of intra-abdominal lymph nodes Yes     Interim History:   Followup visit for this pleasant 79 year old man with mantle cell lymphoma in remission. Initial diagnosis in February of 2010 when he presented with increasing fatigue and abdominal fullness. CT scan showed splenomegaly, periportal lymphadenopathy, bilateral iliac adenopathy, and a dominant lymph node mass in the right pelvis adjacent to the right external iliac vessels measuring 4.8 x 2.3 cm. Additional right inguinal adenopathy 2.9 x 2.6 cm. Diagnosis established by right inguinal lymph node biopsy done 02/01/2009 which showed a CD5 positive large B-cell lymphoma CD20 positive CD5 and CD10 positive lambda light chain restriction. Bone marrow with atypical nodular lymphoid aggregates. Cytogenetic studies did show a population of cells with the 11;14 translocation consistent with mantle cell lymphoma. He was living in [place] at the time. He was treated with 4 cycles of Cytoxan vincristine prednisone and Rituxan followed by 4 cycles of Cytoxan Novantrone vincristine and prednisone through 07/12/2009. He achieved a complete response which has been durable to date. He had CT scans in anticipation of today's visit which I personally reviewed. Please see detailed reports below. Radiologist is reading a number of small lymph nodes which are slightly larger than seen on a study done 6 months ago as signs of progression. No single node is larger than 1.5 cm. I am underwhelmed with these changes. He remains asymptomatic. No anorexia, no fevers, weight is down 4 pounds compared with his April exam here. He has had increasing problems with recent memory and is now on Namenda.   Medications: reviewed  Allergies: No Known Allergies  Review of  Systems: Constitutional:   See above HEENT no sore throat Respiratory: No cough or dyspnea Cardiovascular:  No chest pain or palpitations Gastrointestinal: No abdominal pain or change in bowel habit Genito-Urinary: No urinary tract symptoms Musculoskeletal: No muscle bone or joint pain Neurologic: No headache or change in vision. Possible early dementia Skin: No rash or ecchymosis  Remaining ROS negative.     Physical Exam: Blood pressure 124/67, pulse 66, temperature 98.1 F (36.7 C), temperature source Oral, resp. rate 18, height 5' 11.5" (1.816 m), weight 161 lb 12.8 oz (73.392 kg). Wt Readings from Last 3 Encounters:  09/01/13 161 lb 12.8 oz (73.392 kg)  03/03/13 165 lb 9.6 oz (75.116 kg)  09/02/12 164 lb 11.2 oz (74.707 kg)     General appearance: Well-nourished Caucasian man Jeffrey Simmons: Pharynx no erythema exudate or mass. No thyromegaly Lymph nodes: No cervical, supraclavicular, axillary, or inguinal lymphadenopathy Breasts: Lungs: Clear to auscultation resonant to percussion Heart: Regular rhythm no murmur Abdomen: Soft, nontender, no mass, no organomegaly Extremities: No edema, no calf tenderness Musculoskeletal: No joint deformities GU: Vascular: No carotid bruits, no cyanosis Neurologic: He is alert, oriented, and cooperative. Motor strength is 5 over 5. Cranial nerves grossly normal. Reflexes 1+ symmetric. Skin: No rash or ecchymosis  Lab Results: CBC W/Diff    Component Value Date/Time   WBC 5.2 08/26/2013 0855   RBC 5.03 08/26/2013 0855   HGB 16.3 08/26/2013 0855   HCT 46.8 08/26/2013 0855   PLT 105* 08/26/2013 0855   MCV 93.0 08/26/2013 0855   MCH 32.3 08/26/2013 0855   MCHC 34.7 08/26/2013 0855   RDW 13.7 08/26/2013 0855   LYMPHSABS 0.9 08/26/2013 0855   MONOABS 0.4 08/26/2013  0855   EOSABS 0.1 08/26/2013 0855   BASOSABS 0.0 08/26/2013 0855     Chemistry      Component Value Date/Time   NA 143 08/26/2013 0856   NA 145 02/27/2012 1158   NA 141 07/25/2011 1438    NA 141 07/25/2011 1438   K 4.0 08/26/2013 0856   K 4.4 02/27/2012 1158   K 4.3 07/25/2011 1438   K 4.3 07/25/2011 1438   CL 103 02/24/2013 0923   CL 102 02/27/2012 1158   CL 105 07/25/2011 1438   CL 105 07/25/2011 1438   CO2 29 08/26/2013 0856   CO2 30 02/27/2012 1158   CO2 28 07/25/2011 1438   CO2 28 07/25/2011 1438   BUN 13.7 08/26/2013 0856   BUN 14 02/27/2012 1158   BUN 19 07/25/2011 1438   BUN 19 07/25/2011 1438   CREATININE 1.0 08/26/2013 0856   CREATININE 1.0 02/27/2012 1158   CREATININE 0.98 07/25/2011 1438   CREATININE 0.98 07/25/2011 1438      Component Value Date/Time   CALCIUM 9.3 08/26/2013 0856   CALCIUM 8.7 02/27/2012 1158   CALCIUM 8.6 07/25/2011 1438   CALCIUM 8.6 07/25/2011 1438   ALKPHOS 97 08/26/2013 0856   ALKPHOS 105* 02/27/2012 1158   ALKPHOS 88 07/25/2011 1438   ALKPHOS 88 07/25/2011 1438   AST 23 08/26/2013 0856   AST 33 02/27/2012 1158   AST 19 07/25/2011 1438   AST 19 07/25/2011 1438   ALT 16 08/26/2013 0856   ALT 26 02/27/2012 1158   ALT 15 07/25/2011 1438   ALT 15 07/25/2011 1438   BILITOT 1.38* 08/26/2013 0856   BILITOT 1.10 02/27/2012 1158   BILITOT 1.0 07/25/2011 1438   BILITOT 1.0 07/25/2011 1438       Radiological Studies: Ct Chest W Contrast  08/26/2013   CLINICAL DATA:  Mantle cell lymphoma of intra abdominal lymph nodes. FOLLOW UP.  EXAM: CT CHEST, ABDOMEN, AND PELVIS WITH CONTRAST  TECHNIQUE: Multidetector CT imaging of the chest, abdomen and pelvis was performed following the standard protocol during bolus administration of intravenous contrast.  CONTRAST:  114mL OMNIPAQUE IOHEXOL 300 MG/ML  SOLN  COMPARISON:  02/24/2013 abdominal pelvic CT.  Chest CT of 02/27/2012  FINDINGS: CT CHEST FINDINGS  Lungs/Pleura: No nodules or airspace opacities. No pleural fluid.  Heart/Mediastinum: Atrophy versus surgical absence of the right lobe of the thyroid. Small low-density left-sided thyroid nodules which are similar and nonspecific. Similar small right supraclavicular/low jugular nodes.  Abnormally increased number of bilateral axillary and subpectoral nodes. An index right axillary node measures 1.5 cm on image 14/series 2 versus 1.2 cm on the prior exam. 1.0 cm right subpectoral node on image 10/series 2 measures 7 mm on the prior.  Tortuous descending thoracic aorta. Normal heart size with coronary artery atherosclerosis. No central pulmonary embolism, on this non-dedicated study. No mediastinal or hilar adenopathy. Moderate hiatal hernia. A periesophageal node measures 6 mm on image 45 versus 4 mm on the prior.  CT ABDOMEN AND PELVIS FINDINGS  Abdomen/Pelvis: Right hepatic lobe cyst is similar 2.3 cm. Other too small to characterize liver lesions which are similar to on the prior exam.  Splenic size upper normal, 12.8 cm. This is unchanged. Normal distal stomach, gallbladder, biliary tract, adrenal glands. Bilateral tiny renal cysts. Nodularity adjacent the gallbladder on image 59/series 2. This measures 1.0 cm and is similar to image 24 of the prior exam. Also present back to 02/27/2012.  No retroperitoneal or retrocrural adenopathy. A  gastrohepatic ligament node measures 1.0 cm on image 57/series 2 versus 6 mm on 02/24/2013. Portacaval node measures 9 mm on image 59/series 2 versus 6 mm on the prior exam. Neither is pathologic by size criteria.  Scattered colonic diverticula. Normal appendix, and terminal ileum. Small bowel positioned at the entrance to the right inguinal canal, as before. Small fat containing right inguinal hernia. No small bowel obstruction. No mesenteric adenopathy. No evidence of omental or peritoneal disease.  Right external iliac node which measures 1.2 cm on image 109 versus 9 mm on the prior exam.  A more central right external iliac node measures 8 mm on image 100/series 2 versus 5 mm on the prior exam. Normal urinary bladder. Normal prostate, without significant free pelvic fluid.  Bone windows demonstrate no focal osseous lesion.  IMPRESSION: CT CHEST IMPRESSION  1.  Bilateral axillary/ subpectoral adenopathy, slightly progressive. Consistent with active disease. 2. Moderate hiatal hernia. A periesophageal node is slightly enlarged but not pathologic by size criteria. This warrants followup attention. 3. Coronary artery atherosclerosis.  CT ABDOMEN AND PELVIS IMPRESSION  1. Developing pelvic adenopathy, most consistent with recurrent or progressive disease. 2. Enlargement of upper abdominal nodes. Although not pathologic by size criteria, these are suspicious for progressive disease, given the appearance of the pelvis. 3. Chronic nodularity along the gallbladder fundus. Possibly related to gallbladder adenomyomatosis. Similar back to 02/27/2012. Recommend attention on follow-up. 4. Right inguinal hernia containing fat. Small bowel positioned at its entrance. Similar and nonobstructive.   Electronically Signed   By: Abigail Miyamoto   On: 08/26/2013 11:44    08/26/2013 11:44    Impression: #1. Mantle cell lymphoma treated as outlined above. Minimal change in lymph nodes which are slightly more prominent compared with a study 6 months ago. I don't think there is any indication to jump into treatment. I will get a 3 month interval followup study. We discussed the availability of the recently approved B cell receptor signaling inhibitor ibrutinib. This is an oral drug with significant activity in relapsed Mantle cell lymphoma. This would be my first choice in this elderly man. Other active agents which he has not been exposed to include Bendamustine and Velcade.  #2. Early senile dementia        Annia Belt, MD 10/8/20146:22 PM

## 2013-09-04 ENCOUNTER — Other Ambulatory Visit (HOSPITAL_COMMUNITY): Payer: Self-pay | Admitting: Cardiology

## 2013-09-04 DIAGNOSIS — R55 Syncope and collapse: Secondary | ICD-10-CM

## 2013-09-08 ENCOUNTER — Ambulatory Visit (HOSPITAL_COMMUNITY): Payer: Medicare Other | Attending: Cardiovascular Disease

## 2013-09-08 DIAGNOSIS — R55 Syncope and collapse: Secondary | ICD-10-CM | POA: Diagnosis not present

## 2013-09-08 DIAGNOSIS — I359 Nonrheumatic aortic valve disorder, unspecified: Secondary | ICD-10-CM

## 2013-09-08 NOTE — Progress Notes (Signed)
Echocardiogram performed.  

## 2013-09-14 DIAGNOSIS — Z23 Encounter for immunization: Secondary | ICD-10-CM | POA: Diagnosis not present

## 2013-09-23 DIAGNOSIS — C8589 Other specified types of non-Hodgkin lymphoma, extranodal and solid organ sites: Secondary | ICD-10-CM | POA: Diagnosis not present

## 2013-09-23 DIAGNOSIS — R946 Abnormal results of thyroid function studies: Secondary | ICD-10-CM | POA: Diagnosis not present

## 2013-09-23 DIAGNOSIS — Z0000 Encounter for general adult medical examination without abnormal findings: Secondary | ICD-10-CM | POA: Diagnosis not present

## 2013-09-23 DIAGNOSIS — R413 Other amnesia: Secondary | ICD-10-CM | POA: Diagnosis not present

## 2013-11-09 ENCOUNTER — Other Ambulatory Visit: Payer: Self-pay | Admitting: Internal Medicine

## 2013-11-09 DIAGNOSIS — R1310 Dysphagia, unspecified: Secondary | ICD-10-CM | POA: Diagnosis not present

## 2013-11-09 DIAGNOSIS — K219 Gastro-esophageal reflux disease without esophagitis: Secondary | ICD-10-CM

## 2013-11-12 ENCOUNTER — Ambulatory Visit
Admission: RE | Admit: 2013-11-12 | Discharge: 2013-11-12 | Disposition: A | Discharge status: Home / Self Care | Payer: Medicare Other | Source: Ambulatory Visit | Attending: Internal Medicine | Admitting: Internal Medicine

## 2013-11-12 DIAGNOSIS — K449 Diaphragmatic hernia without obstruction or gangrene: Secondary | ICD-10-CM | POA: Diagnosis not present

## 2013-11-12 DIAGNOSIS — K219 Gastro-esophageal reflux disease without esophagitis: Secondary | ICD-10-CM | POA: Diagnosis not present

## 2013-11-24 ENCOUNTER — Other Ambulatory Visit (HOSPITAL_BASED_OUTPATIENT_CLINIC_OR_DEPARTMENT_OTHER): Payer: Medicare Other

## 2013-11-24 ENCOUNTER — Encounter (HOSPITAL_COMMUNITY): Payer: Self-pay

## 2013-11-24 ENCOUNTER — Ambulatory Visit (HOSPITAL_COMMUNITY)
Admission: RE | Admit: 2013-11-24 | Discharge: 2013-11-24 | Disposition: A | Discharge status: Home / Self Care | Payer: Medicare Other | Source: Ambulatory Visit | Attending: Oncology | Admitting: Oncology

## 2013-11-24 DIAGNOSIS — C8313 Mantle cell lymphoma, intra-abdominal lymph nodes: Secondary | ICD-10-CM | POA: Diagnosis not present

## 2013-11-24 DIAGNOSIS — K7689 Other specified diseases of liver: Secondary | ICD-10-CM | POA: Diagnosis not present

## 2013-11-24 DIAGNOSIS — C8589 Other specified types of non-Hodgkin lymphoma, extranodal and solid organ sites: Secondary | ICD-10-CM | POA: Diagnosis not present

## 2013-11-24 DIAGNOSIS — K5730 Diverticulosis of large intestine without perforation or abscess without bleeding: Secondary | ICD-10-CM | POA: Diagnosis not present

## 2013-11-24 DIAGNOSIS — C8319 Mantle cell lymphoma, extranodal and solid organ sites: Secondary | ICD-10-CM | POA: Insufficient documentation

## 2013-11-24 DIAGNOSIS — E042 Nontoxic multinodular goiter: Secondary | ICD-10-CM | POA: Diagnosis not present

## 2013-11-24 DIAGNOSIS — K449 Diaphragmatic hernia without obstruction or gangrene: Secondary | ICD-10-CM | POA: Diagnosis not present

## 2013-11-24 DIAGNOSIS — K4090 Unilateral inguinal hernia, without obstruction or gangrene, not specified as recurrent: Secondary | ICD-10-CM | POA: Diagnosis not present

## 2013-11-24 DIAGNOSIS — K5900 Constipation, unspecified: Secondary | ICD-10-CM | POA: Insufficient documentation

## 2013-11-24 DIAGNOSIS — I2584 Coronary atherosclerosis due to calcified coronary lesion: Secondary | ICD-10-CM | POA: Diagnosis not present

## 2013-11-24 DIAGNOSIS — R599 Enlarged lymph nodes, unspecified: Secondary | ICD-10-CM | POA: Insufficient documentation

## 2013-11-24 LAB — CBC WITH DIFFERENTIAL/PLATELET
Eosinophils Absolute: 0.1 10*3/uL (ref 0.0–0.5)
LYMPH%: 16.9 % (ref 14.0–49.0)
MCHC: 34.2 g/dL (ref 32.0–36.0)
MCV: 94.1 fL (ref 79.3–98.0)
MONO%: 7.7 % (ref 0.0–14.0)
NEUT#: 4.1 10*3/uL (ref 1.5–6.5)
Platelets: 118 10*3/uL — ABNORMAL LOW (ref 140–400)
RBC: 5.5 10*6/uL (ref 4.20–5.82)
lymph#: 1 10*3/uL (ref 0.9–3.3)

## 2013-11-24 LAB — COMPREHENSIVE METABOLIC PANEL (CC13)
Alkaline Phosphatase: 121 U/L (ref 40–150)
Anion Gap: 12 mEq/L — ABNORMAL HIGH (ref 3–11)
Calcium: 9.8 mg/dL (ref 8.4–10.4)
Glucose: 96 mg/dl (ref 70–140)
Sodium: 141 mEq/L (ref 136–145)
Total Bilirubin: 1.4 mg/dL — ABNORMAL HIGH (ref 0.20–1.20)
Total Protein: 7.5 g/dL (ref 6.4–8.3)

## 2013-11-24 LAB — LACTATE DEHYDROGENASE (CC13): LDH: 202 U/L (ref 125–245)

## 2013-11-24 MED ORDER — IOHEXOL 300 MG/ML  SOLN
100.0000 mL | Freq: Once | INTRAMUSCULAR | Status: AC | PRN
  Administered 2013-11-24: 100 mL via INTRAVENOUS

## 2013-11-27 ENCOUNTER — Ambulatory Visit (HOSPITAL_BASED_OUTPATIENT_CLINIC_OR_DEPARTMENT_OTHER): Payer: Medicare Other | Admitting: Oncology

## 2013-11-27 ENCOUNTER — Telehealth: Payer: Self-pay | Admitting: Oncology

## 2013-11-27 VITALS — BP 137/64 | HR 69 | Temp 97.5°F | Resp 18 | Ht 71.0 in | Wt 168.3 lb

## 2013-11-27 DIAGNOSIS — C8313 Mantle cell lymphoma, intra-abdominal lymph nodes: Secondary | ICD-10-CM | POA: Diagnosis not present

## 2013-11-27 NOTE — Telephone Encounter (Signed)
gv and printed appt sched and avs for pt for Jan 2015 °

## 2013-11-27 NOTE — Progress Notes (Signed)
Hematology and Oncology Follow Up Visit  Jeffrey Simmons [id number] [birth year] 80 y.o. 11/27/2013 6:04 PM   Principle Diagnosis: Encounter Diagnosis  Name Primary?  . Mantle cell lymphoma of intra-abdominal lymph nodes Yes     Interim History: Followup visit for this pleasant 80 year old man with mantle cell lymphoma in remission. Initial diagnosis in February of 2010 when he presented with increasing fatigue and abdominal fullness. CT scan showed splenomegaly, periportal lymphadenopathy, bilateral iliac adenopathy, and a dominant lymph node mass in the right pelvis adjacent to the right external iliac vessels measuring 4.8 x 2.3 cm. Additional right inguinal adenopathy 2.9 x 2.6 cm. Diagnosis established by right inguinal lymph node biopsy done 02/01/2009 which showed a CD5 positive large B-cell lymphoma CD20 positive CD5 and CD10 positive lambda light chain restriction. Bone marrow with atypical nodular lymphoid aggregates. Cytogenetic studies did show a population of cells with the 11;14 translocation consistent with mantle cell lymphoma. He was living in [place] at the time. He was treated with 4 cycles of Cytoxan vincristine prednisone and Rituxan followed by 4 cycles of Cytoxan Novantrone vincristine and prednisone through 07/12/2009. He achieved a complete response which has been durable to date.  CT scans done at 3 and 6 months ago and repeated in anticipation of today's visit on 11/24/2013, which I reviewed in detail with the patient and his wife today, shows a single, stable, 1 cm, porta hepatis lymph node and a 1.2 cm gastrohepatic lymph node which are unchanged. There is a slowly enlarging right external iliac lymph node measuring 1.8 cm compare with 1.2 cm on 08/26/2013 study and not clearly seen on 02/24/2013 study. The patient remains entirely asymptomatic.   Medications: reviewed  Allergies: No Known Allergies  Review of Systems: Hematology:  No bleeding or  bruising ENT ROS: No sore throat Breast ROS:  Respiratory ROS: No cough or dyspnea Cardiovascular ROS:   No chest pain or palpitations Gastrointestinal ROS:   No abdominal pain or change in bowel habit Genito-Urinary ROS no urinary tract symptoms Musculoskeletal ROS: No muscle bone or joint pain Neurological ROS: No headache or change in vision. Decrease in memory. Currently on Namenda. Dermatological ROS: No rash Remaining ROS negative.  Physical Exam: Blood pressure 137/64, pulse 69, temperature 97.5 F (36.4 C), temperature source Oral, resp. rate 18, height 5\' 11"  (1.803 m), weight 168 lb 4.8 oz (76.34 kg), SpO2 99.00%. Wt Readings from Last 3 Encounters:  11/27/13 168 lb 4.8 oz (76.34 kg)  09/01/13 161 lb 12.8 oz (73.392 kg)  03/03/13 165 lb 9.6 oz (75.116 kg)     General appearance: Thin Caucasian man [name]: Pharynx no erythema, exudate, mass, or ulcer. No thyromegaly or thyroid nodules Lymph nodes: No cervical, supraclavicular, or axillary lymphadenopathy Breasts:  Lungs: Clear to auscultation, resonant to percussion throughout Heart: Regular rhythm, no murmur, no gallop, no rub, no click, no edema Abdomen: Soft, nontender, normal bowel sounds, no mass, no organomegaly Extremities: No edema, no calf tenderness Musculoskeletal: no joint deformities GU: Vascular: Carotid pulses 2+ Neurologic: Alert, oriented, PERRLA,  cranial nerves grossly normal, motor strength 5 over 5, reflexes 1+ symmetric, upper body coordination normal, gait normal, Skin: No rash or ecchymosis  Lab Results: CBC W/Diff    Component Value Date/Time   WBC 5.6 11/24/2013 1023   RBC 5.50 11/24/2013 1023   HGB 17.7* 11/24/2013 1023   HCT 51.8* 11/24/2013 1023   PLT 118* 11/24/2013 1023   MCV 94.1 11/24/2013 1023   MCH 32.2 11/24/2013 1023  MCHC 34.2 11/24/2013 1023   RDW 13.4 11/24/2013 1023   LYMPHSABS 1.0 11/24/2013 1023   MONOABS 0.4 11/24/2013 1023   EOSABS 0.1 11/24/2013 1023   BASOSABS  0.1 11/24/2013 1023     Chemistry      Component Value Date/Time   NA 141 11/24/2013 1022   NA 145 02/27/2012 1158   NA 141 07/25/2011 1438   NA 141 07/25/2011 1438   K 4.6 11/24/2013 1022   K 4.4 02/27/2012 1158   K 4.3 07/25/2011 1438   K 4.3 07/25/2011 1438   CL 103 02/24/2013 0923   CL 102 02/27/2012 1158   CL 105 07/25/2011 1438   CL 105 07/25/2011 1438   CO2 27 11/24/2013 1022   CO2 30 02/27/2012 1158   CO2 28 07/25/2011 1438   CO2 28 07/25/2011 1438   BUN 13.9 11/24/2013 1022   BUN 14 02/27/2012 1158   BUN 19 07/25/2011 1438   BUN 19 07/25/2011 1438   CREATININE 1.1 11/24/2013 1022   CREATININE 1.0 02/27/2012 1158   CREATININE 0.98 07/25/2011 1438   CREATININE 0.98 07/25/2011 1438      Component Value Date/Time   CALCIUM 9.8 11/24/2013 1022   CALCIUM 8.7 02/27/2012 1158   CALCIUM 8.6 07/25/2011 1438   CALCIUM 8.6 07/25/2011 1438   ALKPHOS 121 11/24/2013 1022   ALKPHOS 105* 02/27/2012 1158   ALKPHOS 88 07/25/2011 1438   ALKPHOS 88 07/25/2011 1438   AST 34 11/24/2013 1022   AST 33 02/27/2012 1158   AST 19 07/25/2011 1438   AST 19 07/25/2011 1438   ALT 37 11/24/2013 1022   ALT 26 02/27/2012 1158   ALT 15 07/25/2011 1438   ALT 15 07/25/2011 1438   BILITOT 1.40* 11/24/2013 1022   BILITOT 1.10 02/27/2012 1158   BILITOT 1.0 07/25/2011 1438   BILITOT 1.0 07/25/2011 1438       Radiological Studies: Ct Chest W Contrast  11/24/2013   CLINICAL DATA:  History of lymphoma  EXAM: CT CHEST, ABDOMEN, AND PELVIS WITH CONTRAST  TECHNIQUE: Multidetector CT imaging of the chest, abdomen and pelvis was performed following the standard protocol during bolus administration of intravenous contrast.  CONTRAST:  130mL OMNIPAQUE IOHEXOL 300 MG/ML  SOLN  COMPARISON:  08/26/2013  FINDINGS: CT CHEST FINDINGS  Right of the thyroid again absent. Stable nodules left lobe of thyroid. Stable right subpectoral lymph node measuring 1 cm. Image number 11 series 2. The right axillary and next lymph node also appears stable at 1.5 cm  image 13 series 2. The supraclavicular and right low jugular lymph nodes appears stable. Coronary artery calcifications are appreciated.  There is no evidence of mediastinal masses nor adenopathy. The right periesophageal lymph node image 46 series 2 with stable as 6.2 mm. A stable moderate sized hiatal hernia is identified.  The lung parenchyma appears stable no new pulmonary nodules or masses are identified. Hypoventilation appreciated within the lung bases.  CT ABDOMEN AND PELVIS FINDINGS  The liver demonstrates a diffuse low attenuating architecture. A stable cyst is appreciated within the right lobe of the liver as well as a stable small focus chest medially again too small to characterize. The spleen is stable and again within the upper limits of normal. The adrenals, pancreas, kidneys are unremarkable.  A stable 1 cm porta hepatis lymph node is identified image 58 series 2. The gastrohepatic lymph node on image 60 series 2 measures 1.2 cm slightly more prominent than on previous study of this is  likely due to technical variability, and a note is likely stable. Stable subcentimeter retroperitoneal lymph nodes are identified.  There is no evidence of abdominal or pelvic masses free fluid no loculated fluid collections.  There is increased size of the more inferior external right iliac node adjacent to the acetabulum, image 110 series 2 now measuring 1.8 cm. Previously measuring 1.2 cm.  A stable right external iliac node is appreciated measures 7.8 mm image 100 series 2.  There is no evidence of pelvic free fluid nor loculated fluid collections. A stable right inguinal hernia is identified containing nondilated not inflamed loops of small bowel.  Diffuse diverticulosis appreciated within sigmoid colon without evidence of diverticulitis. There is moderate to large amount of fecal retention within the colon.  IMPRESSION: 1. Stable bilateral, subpectoral adenopathy. 2. Moderate hiatal hernia.  The paraesophageal  lymph node is stable. 3. Coronary artery calcifications 4. Slightly more prominent porta hepatis lymph node 5. There has been interval increased size of the more inferior anterior right external iliac lymph node when compared to the previous study. This finding is concerning for component of disease progression. The smaller more superior external iliac lymph node appears stable. 6. Stable hiatal hernia, diverticulosis, and right inguinal hernia   Electronically Signed   By: Margaree Mackintosh M.D.   On: 11/24/2013 12:22     11/12/2013   CLINICAL DATA:  GERD  EXAM: UPPER GI SERIES W/HIGH DENSITY W/KUB  TECHNIQUE: After obtaining a scout radiograph a routine upper GI series was performed using high-density barium  COMPARISON:  CT 08/26/2013  FLUOROSCOPY TIME:  1 min 42 seconds  FINDINGS: Moderately large hiatal hernia. Moderate gastroesophageal reflux with the patient supine. Negative for stricture or mass. 13 mm barium tablet passed readily into the stomach without delay.  The stomach and duodenum bulb are normal.  Mild constipation without bowel obstruction on the preliminary KUB  IMPRESSION: Moderately large hiatal hernia with gastroesophageal reflux. Negative for stricture or mass.   Electronically Signed   By: Franchot Gallo M.D.   On: 11/12/2013 09:35    Impression:  Single area of asymptomatic slowly enlarging right iliac lymphadenopathy in a man with history of mantle cell lymphoma now out 5 years from diagnosis. I had a lengthy discussion with the patient and his wife. It is very difficult to make a decision to put somebody back on treatment based on a small, asymptomatic, slowly enlarging, single lymph node area. There is a subset of mantle cell lymphoma patients who behave in an indolent fashion. I am still more inclined to watch rather than to jump into a treatment program. I outlined a number of possible treatment options other than observation which would include involved field radiation, ibrutinib  oral kinase inhibitor, or combination of Velcade and Rituxan. I want to obtain his original biopsy material to confirm the diagnosis of mantle cell lymphoma before making a final decision on treatment. I will see him back in a few weeks.    CC: Patient Care Team: Kandice Hams, MD as PCP - General (Internal Medicine)   Annia Belt, MD 1/2/20156:04 PM

## 2013-11-30 ENCOUNTER — Telehealth: Payer: Self-pay | Admitting: *Deleted

## 2013-11-30 NOTE — Telephone Encounter (Signed)
Received call from pt's wife stating that Dr. Beryle Beams gave them 3 options for treatment to think about & she wants to know if pt took the IV chemo, would they be finished before Dr. Beryle Beams leaves.  Reviewed Dr. Azucena Freed note from 11/27/13 & informed that Dr. Beryle Beams was waiting on original biopsy report before making a decision & if the chemo was only for 4 wks, he should be finished before Dr Beryle Beams left but no matter what Dr. Beryle Beams would make sure that pt was followed by another physician before he leaves.  She reports that she will call back in @ 1 wk to f/u.  She reports that she doesn't have any info at home that states mantle cell lymphoma from biopsy.  Note to Dr Beryle Beams.

## 2013-12-10 NOTE — Telephone Encounter (Signed)
Per Dr. Beryle Beams; notified pt that outside pathology dept. Sent incomplete information and we have called again to get slides sent here for our pathologist to review.  Pt verbalized understanding and expressed appreciation for information.

## 2013-12-16 ENCOUNTER — Other Ambulatory Visit: Payer: Self-pay | Admitting: Oncology

## 2013-12-16 DIAGNOSIS — C8315 Mantle cell lymphoma, lymph nodes of inguinal region and lower limb: Secondary | ICD-10-CM | POA: Diagnosis not present

## 2013-12-25 ENCOUNTER — Ambulatory Visit (HOSPITAL_BASED_OUTPATIENT_CLINIC_OR_DEPARTMENT_OTHER): Payer: Medicare Other | Admitting: Oncology

## 2013-12-25 ENCOUNTER — Telehealth: Payer: Self-pay | Admitting: Oncology

## 2013-12-25 DIAGNOSIS — C8313 Mantle cell lymphoma, intra-abdominal lymph nodes: Secondary | ICD-10-CM | POA: Diagnosis not present

## 2013-12-25 MED ORDER — ONDANSETRON 4 MG PO TBDP: 4.0000 mg | ORAL_TABLET | Freq: Three times a day (TID) | ORAL | Status: DC | PRN

## 2013-12-25 NOTE — Telephone Encounter (Signed)
Gave pt appt for lab and MD on february 2015

## 2013-12-25 NOTE — Progress Notes (Signed)
Hematology and Oncology Follow Up Visit  Jeffrey Simmons [id number] 12/06/1926 80 y.o. 12/25/2013 5:51 PM   Principle Diagnosis: Encounter Diagnosis  Name Primary?  . Mantle cell lymphoma of intra-abdominal lymph nodes Yes     Interim History: Short interval followup visit for this 80 year old man initially diagnosed with mantle cell lymphoma in February 2010. He received chemotherapy plus Rituxan in [place] where he was living at the time. Please see my summary note dated 11/27/2013 for additional details. Most recent survey CT scan done 11/24/2013 showed a single area of lymph gland enlargement involving the right external iliac node. We reviewed possible treatment options at that time. I wanted to review the pathology from the outside hospital since there was some confusion as to whether or not he really did have Mantle cell lymphoma. It is taken a month to get the material and they never sent the paraffin blocks. Our hemato pathologist has reviewed the bone marrow biopsy. It turns out the patient had 2 bone marrow biopsies. The first one was negative. The second one did have involvement with what does appear to be a aggressive appearing mantle cell lymphoma. Tests on this material did show a typical 11; 14 translocation in the cyclin D1 gene. Additional studies will be done on a paraffin blocks if and when they arrived. I think we have enough information now to outline a treatment plan. He is in no interim problems. He is not having any pain. Performance status is excellent. No loss of appetite or weight.  Medications: reviewed  Allergies: No Known Allergies  Review of Systems: Hematology:  No bleeding or bruising ENT ROS:  Breast ROS:  Respiratory ROS: No cough or dyspnea Cardiovascular ROS:  No chest pain or palpitations Gastrointestinal ROS: No abdominal pain or change in bowel habit   Genito-Urinary ROS:  Musculoskeletal ROS: No muscle bone or joint  pain Neurological ROS: No change in his neuro status-early mild dementia Dermatological ROS: No rash Remaining ROS negative:   Physical Exam: There were no vitals taken for this visit. Wt Readings from Last 3 Encounters:  11/27/13 168 lb 4.8 oz (76.34 kg)  09/01/13 161 lb 12.8 oz (73.392 kg)  03/03/13 165 lb 9.6 oz (75.116 kg)     General appearance: Thin but well-nourished Caucasian man [name]: Pharynx no erythema, exudate, mass, or ulcer. No thyromegaly or thyroid nodules Lymph nodes: No cervical, supraclavicular, or axillary lymphadenopathy Breasts:  Lungs: Clear to auscultation, resonant to percussion throughout Heart: Regular rhythm, no murmur, no gallop, no rub, no click, no edema Abdomen: Soft, nontender, normal bowel sounds, no mass, no organomegaly Extremities: No edema, no calf tenderness Musculoskeletal: no joint deformities GU:  Vascular: Carotid pulses 2+, no bruits,  Neurologic: Alert, oriented, PERRLA, cranial nerves grossly normal, motor strength 5 over 5, reflexes 1+ symmetric, upper body coordination normal, gait normal, memory not tested, there is a moderately severe decrease in vibration sensation over the fingertips by tuning fork exam Skin: No rash or ecchymosis  Lab Results: CBC W/Diff    Component Value Date/Time   WBC 5.6 11/24/2013 1023   RBC 5.50 11/24/2013 1023   HGB 17.7* 11/24/2013 1023   HCT 51.8* 11/24/2013 1023   PLT 118* 11/24/2013 1023   MCV 94.1 11/24/2013 1023   MCH 32.2 11/24/2013 1023   MCHC 34.2 11/24/2013 1023   RDW 13.4 11/24/2013 1023   LYMPHSABS 1.0 11/24/2013 1023   MONOABS 0.4 11/24/2013 1023   EOSABS 0.1 11/24/2013 1023   BASOSABS 0.1  11/24/2013 1023     Chemistry      Component Value Date/Time   NA 141 11/24/2013 1022   NA 145 02/27/2012 1158   NA 141 07/25/2011 1438   NA 141 07/25/2011 1438   K 4.6 11/24/2013 1022   K 4.4 02/27/2012 1158   K 4.3 07/25/2011 1438   K 4.3 07/25/2011 1438   CL 103 02/24/2013 0923   CL 102  02/27/2012 1158   CL 105 07/25/2011 1438   CL 105 07/25/2011 1438   CO2 27 11/24/2013 1022   CO2 30 02/27/2012 1158   CO2 28 07/25/2011 1438   CO2 28 07/25/2011 1438   BUN 13.9 11/24/2013 1022   BUN 14 02/27/2012 1158   BUN 19 07/25/2011 1438   BUN 19 07/25/2011 1438   CREATININE 1.1 11/24/2013 1022   CREATININE 1.0 02/27/2012 1158   CREATININE 0.98 07/25/2011 1438   CREATININE 0.98 07/25/2011 1438      Component Value Date/Time   CALCIUM 9.8 11/24/2013 1022   CALCIUM 8.7 02/27/2012 1158   CALCIUM 8.6 07/25/2011 1438   CALCIUM 8.6 07/25/2011 1438   ALKPHOS 121 11/24/2013 1022   ALKPHOS 105* 02/27/2012 1158   ALKPHOS 88 07/25/2011 1438   ALKPHOS 88 07/25/2011 1438   AST 34 11/24/2013 1022   AST 33 02/27/2012 1158   AST 19 07/25/2011 1438   AST 19 07/25/2011 1438   ALT 37 11/24/2013 1022   ALT 26 02/27/2012 1158   ALT 15 07/25/2011 1438   ALT 15 07/25/2011 1438   BILITOT 1.40* 11/24/2013 1022   BILITOT 1.10 02/27/2012 1158   BILITOT 1.0 07/25/2011 1438   BILITOT 1.0 07/25/2011 1438      Impression:  Early progression of mantle cell lymphoma. He is currently asymptomatic. Minimal disease on CT scan. I'm going to recommend a trial of ibrutinib. I will start at the dose recommended for chronic lymphocytic leukemia of 420 mg daily given his age. If he tolerates the drug after a few weeks I can escalate to the recommended dose for mantle cell lymphoma of 560 mg(4 capsules versus 3 capsules). I reviewed all potential side effects with him and his wife. Most frequent side effect has been diarrhea. I recommended that he pick up some Imodium from his pharmacy to have on hand. I'm prescribing Zofran oral dissolving tablets 4 mg every 8 hours prn nausea. I will monitor her CBCs weekly for the first month and then as indicated. Chem profile monthly. He is advised to call for any significant side effects.  Time spent with exam, initiation of new treatment program, direct face-to-face patient and wife counseling, and  coordination of care, over 40 minutes.   CC: Patient Care Team: Kandice Hams, MD as PCP - General (Internal Medicine)   Annia Belt, MD 1/30/20155:51 PM

## 2013-12-28 ENCOUNTER — Encounter: Payer: Self-pay | Admitting: Oncology

## 2013-12-28 NOTE — Progress Notes (Signed)
Faxed ibruvica prescription to [facility].

## 2013-12-29 ENCOUNTER — Encounter: Payer: Self-pay | Admitting: *Deleted

## 2013-12-31 ENCOUNTER — Encounter: Payer: Self-pay | Admitting: Oncology

## 2013-12-31 NOTE — Progress Notes (Signed)
[employer], [id number], approved imbruvica from 12/30/13-12/30/14

## 2014-01-04 ENCOUNTER — Encounter: Payer: Self-pay | Admitting: Oncology

## 2014-01-04 NOTE — Progress Notes (Signed)
Received letter from Patient [employer].  Pt is approved for Imbruvica from 01/04/14 to 01/03/15 or when benefit cap has been met.  Expenses can be submitted for reimbursement for dos 10/06/13 to 01/03/15.  The amount of the grant is $10,000.

## 2014-01-05 ENCOUNTER — Encounter: Payer: Self-pay | Admitting: Diagnostic Neuroimaging

## 2014-01-05 ENCOUNTER — Ambulatory Visit (INDEPENDENT_AMBULATORY_CARE_PROVIDER_SITE_OTHER): Payer: Medicare Other | Admitting: Diagnostic Neuroimaging

## 2014-01-05 ENCOUNTER — Encounter (INDEPENDENT_AMBULATORY_CARE_PROVIDER_SITE_OTHER): Payer: Self-pay

## 2014-01-05 VITALS — BP 120/69 | HR 73 | Temp 98.2°F | Ht 73.0 in | Wt 169.0 lb

## 2014-01-05 DIAGNOSIS — F0390 Unspecified dementia without behavioral disturbance: Secondary | ICD-10-CM | POA: Diagnosis not present

## 2014-01-05 DIAGNOSIS — F03B0 Unspecified dementia, moderate, without behavioral disturbance, psychotic disturbance, mood disturbance, and anxiety: Secondary | ICD-10-CM | POA: Insufficient documentation

## 2014-01-05 NOTE — Progress Notes (Addendum)
[facility]  PATIENT: Jeffrey Simmons DOB: 1927/06/05  REFERRING CLINICIAN:  HISTORY FROM: patient and wife  REASON FOR VISIT: follow up   HISTORICAL  CHIEF COMPLAINT:  Chief Complaint  Patient presents with  . Follow-up    dementia    HISTORY OF PRESENT ILLNESS:   UPDATE 01/05/14: Since last visit, memory loss has progressed. Also, his mantle cell lymphoma has returned, and now he is about to start ibrutinib per Dr. Beryle Beams. Patient and wife recently moved to independent living retirement community [facility]).   UPDATE 09/10/12: Doing well. Memory better. Tolerating meds. Still on namenda.  PRIOR HPI (06/03/12): 80 year old right-handed male here for evaluation of memory difficulties. He is here with his wife.  Patient has noted some forgetfulness but he does not think this is worse than other people his age. His wife has noticed some problems such as patient forgetting recent conversations, acquaintances, driving directions and tasks. They noticed these problems in July 2012 when he moved back to [place]. Patient still maintains most of his activities of daily living including yard work, eating bathing dressing. His wife pays the bills, but has done this for most of her life together (married 30 years).  REVIEW OF SYSTEMS: Full 14 system review of systems performed and notable only for swollen lymph nodes memory loss confusion.   ALLERGIES: No Known Allergies  HOME MEDICATIONS: Outpatient Prescriptions Prior to Visit  Medication Sig Dispense Refill  . amoxicillin (AMOXIL) 500 MG capsule Take 2,000 mg by mouth once as needed. 1 hour before dental procedure      . aspirin 81 MG tablet Take 81 mg by mouth daily.      . Memantine HCl (NAMENDA XR PO) Take 1 tablet by mouth daily. 28 mg      . Multiple Vitamin (MULITIVITAMIN WITH MINERALS) TABS Take 1 tablet by mouth daily. Centrum Silver      . Multiple Vitamins-Minerals (EYE-VITE PLUS LUTEIN) CAPS Take  1 capsule by mouth 2 (two) times daily.      Marland Kitchen omeprazole (PRILOSEC) 20 MG capsule Take 20 mg by mouth daily.      Marland Kitchen ibrutinib (IMBRUVICA) 140 MG capsul Take 420 mg by mouth daily. Script given to [facility] 12/28/13 & script sent by her to [facility].      Marland Kitchen ondansetron (ZOFRAN ODT) 4 MG disintegrating tablet Take 1 tablet (4 mg total) by mouth every 8 (eight) hours as needed for nausea or vomiting.  20 tablet  6   No facility-administered medications prior to visit.    PAST MEDICAL HISTORY: Past Medical History  Diagnosis Date  . Mantle cell lymphoma of intra-abdominal lymph nodes 03/04/2012    Dx 01/07/09 dominant mass R pelvis 4.8 x 2.3 cm  bx R inguinal node 02/01/09 Rx R-CVP x 4 then 4x CNovantrone VP    PAST SURGICAL HISTORY: Past Surgical History  Procedure Laterality Date  . Replacement total knee  2008    FAMILY HISTORY: Family History  Problem Relation Age of Onset  . Stroke Mother     SOCIAL HISTORY:  History   Social History  . Marital Status: Married    Spouse Name: [name]    Number of Children: 2  . Years of Education: HS   Occupational History  . Retired    Social History Main Topics  . Smoking status: Former Smoker    Quit date: 11/26/1966  . Smokeless tobacco: Never Used  . Alcohol Use: Yes     Comment: 1 or  2 beers daily  . Drug Use: No  . Sexual Activity: Not on file   Other Topics Concern  . Not on file   Social History Narrative   Patient lives at home with his spouse.   Caffeine Use: 1/2 cup of caffeine daily     PHYSICAL EXAM  Filed Vitals:   01/05/14 1409  BP: 120/69  Pulse: 73  Temp: 98.2 F (36.8 C)  TempSrc: Oral  Height: 6\' 1"  (1.854 m)  Weight: 169 lb (76.658 kg)    Not recorded    Body mass index is 22.3 kg/(m^2).  GENERAL EXAM: Patient is in no distress; well developed, nourished and groomed; neck is supple  CARDIOVASCULAR: Regular rate and rhythm, no murmurs, no carotid bruits  NEUROLOGIC: MENTAL STATUS: awake,  alert; language fluent, comprehension intact, naming intact, fund of knowledge appropriate; MMSE 18/30 (ORIENTED TO Jan 05, 2014, [place], MISSES 3 ON SERIAL 7S, 0/3 RECALL). AFT 12. NO FRONTAL RELEASE SIGNS. CRANIAL NERVE: pupils equal and reactive to light, visual fields full to confrontation, extraocular muscles intact, no nystagmus, facial sensation and strength symmetric, hearing intact, palate elevates symmetrically, uvula midline, shoulder shrug symmetric, tongue midline MOTOR: normal bulk and tone, full strength in the BUE, BLE SENSORY: normal and symmetric to light touch COORDINATION: finger-nose-finger, fine finger movements, heel-shin normal REFLEXES: deep tendon reflexes present and symmetric GAIT/STATION: narrow based gait; SLOW, CAUTIOUS TURN, DECR ARM SWING.     DIAGNOSTIC DATA (LABS, IMAGING, TESTING) - I reviewed patient records, labs, notes, testing and imaging myself where available.  Lab Results  Component Value Date   WBC 5.6 11/24/2013   HGB 17.7* 11/24/2013   HCT 51.8* 11/24/2013   MCV 94.1 11/24/2013   PLT 118* 11/24/2013      Component Value Date/Time   NA 141 11/24/2013 1022   NA 145 02/27/2012 1158   NA 141 07/25/2011 1438   NA 141 07/25/2011 1438   K 4.6 11/24/2013 1022   K 4.4 02/27/2012 1158   K 4.3 07/25/2011 1438   K 4.3 07/25/2011 1438   CL 103 02/24/2013 0923   CL 102 02/27/2012 1158   CL 105 07/25/2011 1438   CL 105 07/25/2011 1438   CO2 27 11/24/2013 1022   CO2 30 02/27/2012 1158   CO2 28 07/25/2011 1438   CO2 28 07/25/2011 1438   GLUCOSE 96 11/24/2013 1022   GLUCOSE 98 02/24/2013 0923   GLUCOSE 96 02/27/2012 1158   GLUCOSE 83 07/25/2011 1438   GLUCOSE 83 07/25/2011 1438   BUN 13.9 11/24/2013 1022   BUN 14 02/27/2012 1158   BUN 19 07/25/2011 1438   BUN 19 07/25/2011 1438   CREATININE 1.1 11/24/2013 1022   CREATININE 1.0 02/27/2012 1158   CREATININE 0.98 07/25/2011 1438   CREATININE 0.98 07/25/2011 1438   CALCIUM 9.8 11/24/2013 1022   CALCIUM 8.7 02/27/2012  1158   CALCIUM 8.6 07/25/2011 1438   CALCIUM 8.6 07/25/2011 1438   PROT 7.5 11/24/2013 1022   PROT 6.8 02/27/2012 1158   PROT 5.8* 07/25/2011 1438   PROT 5.8* 07/25/2011 1438   ALBUMIN 4.4 11/24/2013 1022   ALBUMIN 4.3 07/25/2011 1438   ALBUMIN 4.3 07/25/2011 1438   AST 34 11/24/2013 1022   AST 33 02/27/2012 1158   AST 19 07/25/2011 1438   AST 19 07/25/2011 1438   ALT 37 11/24/2013 1022   ALT 26 02/27/2012 1158   ALT 15 07/25/2011 1438   ALT 15 07/25/2011 1438   ALKPHOS 121  11/24/2013 1022   ALKPHOS 105* 02/27/2012 1158   ALKPHOS 88 07/25/2011 1438   ALKPHOS 88 07/25/2011 1438   BILITOT 1.40* 11/24/2013 1022   BILITOT 1.10 02/27/2012 1158   BILITOT 1.0 07/25/2011 1438   BILITOT 1.0 07/25/2011 1438   No results found for this basename: CHOL, HDL, LDLCALC, LDLDIRECT, TRIG, CHOLHDL   No results found for this basename: HGBA1C   No results found for this basename: VITAMINB12   No results found for this basename: TSH    06/09/12 MRI brain  1. Moderate perisylvian and mesial temporal atrophy. 2. Moderate periventricular and subcortical chronic small vessel ischemic disease.   ASSESSMENT AND PLAN  80 y.o. year old male with progressive short-term and long-term memory problems. MMSE 18/30. No frontal release signs. Suspect underlying neurodegenerative dementia. Apparently B12 and TSH testing were normal. Tried aricept, but stopped because of muscle cramps and insomnia. Now on namenda XR.   Dx: moderate dementia   PLAN: 1. continue namenda XR 2. safety issues and end of life issues reviewed  Return if symptoms worsen or fail to improve, for return to PCP.   Penni Bombard, MD [id number], [id number] PM Certified in Neurology, Neurophysiology and Neuroimaging  [facility] [address], [place], [place] [address] [phone]

## 2014-01-06 ENCOUNTER — Telehealth: Payer: Self-pay | Admitting: *Deleted

## 2014-01-06 NOTE — Telephone Encounter (Signed)
Received confirmation fax from [facility] stating script has been shipped 01/05/14 for delivery next business day.-Ibruvica.

## 2014-01-15 ENCOUNTER — Other Ambulatory Visit (HOSPITAL_BASED_OUTPATIENT_CLINIC_OR_DEPARTMENT_OTHER): Payer: Medicare Other

## 2014-01-15 DIAGNOSIS — C8313 Mantle cell lymphoma, intra-abdominal lymph nodes: Secondary | ICD-10-CM

## 2014-01-15 LAB — CBC WITH DIFFERENTIAL/PLATELET
BASO%: 1 % (ref 0.0–2.0)
Basophils Absolute: 0.1 10e3/uL (ref 0.0–0.1)
EOS%: 1 % (ref 0.0–7.0)
Eosinophils Absolute: 0.1 10e3/uL (ref 0.0–0.5)
HCT: 48.4 % (ref 38.4–49.9)
HGB: 16.1 g/dL (ref 13.0–17.1)
LYMPH%: 30.9 % (ref 14.0–49.0)
MCH: 31.2 pg (ref 27.2–33.4)
MCHC: 33.2 g/dL (ref 32.0–36.0)
MCV: 93.8 fL (ref 79.3–98.0)
MONO#: 0.5 10e3/uL (ref 0.1–0.9)
MONO%: 8.4 % (ref 0.0–14.0)
NEUT#: 3.7 10e3/uL (ref 1.5–6.5)
NEUT%: 58.7 % (ref 39.0–75.0)
Platelets: 102 10e3/uL — ABNORMAL LOW (ref 140–400)
RBC: 5.16 10e6/uL (ref 4.20–5.82)
RDW: 13.5 % (ref 11.0–14.6)
WBC: 6.3 10e3/uL (ref 4.0–10.3)
lymph#: 2 10e3/uL (ref 0.9–3.3)

## 2014-01-15 LAB — LACTATE DEHYDROGENASE (CC13): LDH: 182 U/L (ref 125–245)

## 2014-01-22 ENCOUNTER — Telehealth: Payer: Self-pay | Admitting: Hematology and Oncology

## 2014-01-22 ENCOUNTER — Ambulatory Visit (HOSPITAL_BASED_OUTPATIENT_CLINIC_OR_DEPARTMENT_OTHER): Payer: Medicare Other | Admitting: Oncology

## 2014-01-22 VITALS — BP 119/57 | HR 60 | Temp 97.7°F | Resp 18 | Ht 73.0 in | Wt 168.3 lb

## 2014-01-22 DIAGNOSIS — C8313 Mantle cell lymphoma, intra-abdominal lymph nodes: Secondary | ICD-10-CM

## 2014-01-22 DIAGNOSIS — F0390 Unspecified dementia without behavioral disturbance: Secondary | ICD-10-CM

## 2014-01-22 NOTE — Telephone Encounter (Signed)
Gave pt appt for lab every month until APril 2015

## 2014-01-24 NOTE — Progress Notes (Signed)
Hematology and Oncology Follow Up Visit  Jeffrey Simmons [id number] 1927-06-13 80 y.o. 01/24/2014 10:04 AM   Principle Diagnosis: Encounter Diagnosis  Name Primary?  . Mantle cell lymphoma of intra-abdominal lymph nodes Yes     Interim History:   Short interval followup visit for this 80 year old man to assess tolerance of recently started ibrutinib for early relapse of Mantle cell lymphoma. He wasinitially diagnosed with mantle cell lymphoma in February 2010. He had intra-abdominal, pelvic, adenopathy and splenomegaly. He received chemotherapy plus Rituxan in [place] where he was living at the time. Please see my summary note dated 11/27/2013 for additional details.  Most recent survey CT scan done 11/24/2013 showed a single area of slowly enlarging lymphadenopathy involving the right external iliac node compared with 8 08/26/2013 study. Spleen stable at upper limit of normal size. We reviewed possible treatment options at that time.  I wanted to review the pathology from the outside hospital since there was some confusion as to whether or not he really did have Mantle cell lymphoma.  It has taken over a month to get the material and they initially did not send the paraffin blocks.  Our hemato pathologist has reviewed the bone marrow biopsy. It turns out the patient had 2 bone marrow biopsies. The first one was negative. The second one did have involvement with what does appear to be a aggressive appearing mantle cell lymphoma. Tests on this material did show a typical 11; 14 translocation in the cyclin D1 gene. Review of the lymph node material by our hematologist confirms that this is in fact Mantle cell lymphoma. Based on these results, I elected to start the patient on ibrutinib. He is taking the drug for 2 weeks now without any significant side effects. Specifically, he has not experienced severe fatigue which at seen with the patient's son, no nausea or vomiting, no rash, no  diarrhea although bowel movements are soft, no early myelosuppression.    Medications: reviewed  Allergies: No Known Allergies  Review of Systems: Hematology:  No bleeding or bruising ENT ROS: No sore throat Breast ROS:  Respiratory ROS: No cough or dyspnea Cardiovascular ROS:  No chest pain or palpitations Gastrointestinal ROS:   No abdominal pain or change in bowel habit Genito-Urinary ROS: No urinary tract symptoms Musculoskeletal ROS: No muscle bone or joint pain Neurological ROS: No headache or change in vision Dermatological ROS: No rash or ecchymosis Remaining ROS negative:   Physical Exam: Blood pressure 119/57, pulse 60, temperature 97.7 F (36.5 C), temperature source Oral, resp. rate 18, height _0  (1.854 m), weight 168 lb 4.8 oz (76.34 kg), SpO2 98.00%. Wt Readings from Last 3 Encounters:  01/22/14 168 lb 4.8 oz (76.34 kg)  01/05/14 169 lb (76.658 kg)  11/27/13 168 lb 4.8 oz (76.34 kg)     General appearance: Well-nourished Caucasian man [name]: Pharynx no erythema, exudate, mass, or ulcer. No thyromegaly or thyroid nodules Lymph nodes: No cervical, supraclavicular, or axillary lymphadenopathy Breasts:  Lungs: Clear to auscultation, resonant to percussion throughout Heart: Regular rhythm, no murmur, no gallop, no rub, no click, no edema Abdomen: Soft, nontender, normal bowel sounds, no mass, no organomegaly Extremities: No edema, no calf tenderness Musculoskeletal: no joint deformities GU:  Vascular: Carotid pulses 2+,  Neurologic: Alert, oriented, PERRLA, , cranial nerves grossly normal, motor strength 5 over 5, reflexes 1+ symmetric, upper body coordination normal, gait normal, Skin: No rash or ecchymosis  Lab Results: CBC W/Diff    Component Value Date/Time  WBC 6.3 01/15/2014 1240   RBC 5.16 01/15/2014 1240   HGB 16.1 01/15/2014 1240   HCT 48.4 01/15/2014 1240   PLT 102* 01/15/2014 1240   MCV 93.8 01/15/2014 1240   MCH 31.2 01/15/2014 1240   MCHC 33.2  01/15/2014 1240   RDW 13.5 01/15/2014 1240   LYMPHSABS 2.0 01/15/2014 1240   MONOABS 0.5 01/15/2014 1240   EOSABS 0.1 01/15/2014 1240   BASOSABS 0.1 01/15/2014 1240     Chemistry      Component Value Date/Time   NA 141 11/24/2013 1022   NA 145 02/27/2012 1158   NA 141 07/25/2011 1438   NA 141 07/25/2011 1438   K 4.6 11/24/2013 1022   K 4.4 02/27/2012 1158   K 4.3 07/25/2011 1438   K 4.3 07/25/2011 1438   CL 103 02/24/2013 0923   CL 102 02/27/2012 1158   CL 105 07/25/2011 1438   CL 105 07/25/2011 1438   CO2 27 11/24/2013 1022   CO2 30 02/27/2012 1158   CO2 28 07/25/2011 1438   CO2 28 07/25/2011 1438   BUN 13.9 11/24/2013 1022   BUN 14 02/27/2012 1158   BUN 19 07/25/2011 1438   BUN 19 07/25/2011 1438   CREATININE 1.1 11/24/2013 1022   CREATININE 1.0 02/27/2012 1158   CREATININE 0.98 07/25/2011 1438   CREATININE 0.98 07/25/2011 1438      Component Value Date/Time   CALCIUM 9.8 11/24/2013 1022   CALCIUM 8.7 02/27/2012 1158   CALCIUM 8.6 07/25/2011 1438   CALCIUM 8.6 07/25/2011 1438   ALKPHOS 121 11/24/2013 1022   ALKPHOS 105* 02/27/2012 1158   ALKPHOS 88 07/25/2011 1438   ALKPHOS 88 07/25/2011 1438   AST 34 11/24/2013 1022   AST 33 02/27/2012 1158   AST 19 07/25/2011 1438   AST 19 07/25/2011 1438   ALT 37 11/24/2013 1022   ALT 26 02/27/2012 1158   ALT 15 07/25/2011 1438   ALT 15 07/25/2011 1438   BILITOT 1.40* 11/24/2013 1022   BILITOT 1.10 02/27/2012 1158   BILITOT 1.0 07/25/2011 1438   BILITOT 1.0 07/25/2011 1438         Impression:  #1. Early recurrence of mantle cell lymphoma 5 years after initial diagnosis and treatment. He is tolerating a trial of ibrutinib started in mid February 2015. I will monitor her counts on a monthly basis. I will transition his care to Dr. Alvy Bimler. Visit in 6 weeks. Subsequent visit and CT scan in 12 weeks.  #2. Early dementia He remains very functional at this time. He is pleasant and cooperative. His main deficit is recent memory. Wife very attentive.   CC: Patient  Care Team: Kandice Hams, MD as PCP - General (Internal Medicine)   Annia Belt, MD 3/1/201510:04 AM

## 2014-01-25 ENCOUNTER — Telehealth: Payer: Self-pay | Admitting: Hematology and Oncology

## 2014-01-25 NOTE — Telephone Encounter (Signed)
Pt already aware of appt with Alvy Bimler on April , appt given to pt on 2/27, called pt to remind him

## 2014-01-28 ENCOUNTER — Telehealth: Payer: Self-pay | Admitting: *Deleted

## 2014-01-28 ENCOUNTER — Other Ambulatory Visit: Payer: Self-pay | Admitting: *Deleted

## 2014-01-28 DIAGNOSIS — C8313 Mantle cell lymphoma, intra-abdominal lymph nodes: Secondary | ICD-10-CM

## 2014-01-28 MED ORDER — IBRUTINIB 140 MG PO CAPS: 420.0000 mg | ORAL_CAPSULE | Freq: Every day | ORAL | Status: DC

## 2014-01-28 NOTE — Telephone Encounter (Signed)
THIS REFILL REQUEST FOR IMBRUVICA WAS GIVEN TO DR.GRANFORTUNA'S NURSE, MYRTLE HARDIN,RN.

## 2014-01-28 NOTE — Telephone Encounter (Signed)
Called & spoke with pt's wife & informed that pt should cont with observation for now & call if symptoms worse per Dr Beryle Beams.  OK to take benadryl for itching.  Suggested pt call this office if rash spreads or is worse & not to call [facility] since he will be seeing Dr Alvy Bimler.  Wife expressed understanding.

## 2014-01-28 NOTE — Telephone Encounter (Addendum)
PT.'S WIFE NOTICED RASH ON 01/23/14. THE AREAS WHICH LEAVE "TINY DROPS OF BLOOD ON PT.'S PILLOW", FORM A SCAB AND THEN GO AWAY. PT. NOW HAS AREAS ON THE LEFT SIDE OF HIS NECK, THE TIP OF HIS NOSE, AND BOTH SIDES OF HIS HEAD.  NO FEVER, PAIN, OR DRAINAGE. PT.'S WIFE IS CONCERNED ABOUT SKIN CANCER. PT. ALSO COMPLAINED OF ITCHY EYES BUT ARE USING DROPS FOR DRY EYES WHICH HELPED. PT.STARTED IBRUTINIB MID February. THIS NOTE TO DR.GRANFORTUNA'S NURSE, MYRTLE HARDIN,RN.

## 2014-02-01 ENCOUNTER — Other Ambulatory Visit: Payer: Medicare Other

## 2014-02-03 ENCOUNTER — Telehealth: Payer: Self-pay | Admitting: Hematology and Oncology

## 2014-02-03 NOTE — Telephone Encounter (Signed)
[facility] sent facsimile confirmation of Imbruvica prescription shipment.  Imbruvica was shipped on 02-02-2014 with next business day delivery.

## 2014-02-03 NOTE — Telephone Encounter (Signed)
pt called to r/s missed appt...done....pt aware of new d.t °

## 2014-02-04 ENCOUNTER — Other Ambulatory Visit (HOSPITAL_BASED_OUTPATIENT_CLINIC_OR_DEPARTMENT_OTHER): Payer: Medicare Other

## 2014-02-04 DIAGNOSIS — C8313 Mantle cell lymphoma, intra-abdominal lymph nodes: Secondary | ICD-10-CM

## 2014-02-04 LAB — CBC WITH DIFFERENTIAL/PLATELET
BASO%: 0.5 % (ref 0.0–2.0)
Basophils Absolute: 0 10*3/uL (ref 0.0–0.1)
EOS%: 1.4 % (ref 0.0–7.0)
Eosinophils Absolute: 0.1 10*3/uL (ref 0.0–0.5)
HCT: 46.5 % (ref 38.4–49.9)
HGB: 15.7 g/dL (ref 13.0–17.1)
LYMPH#: 1.5 10*3/uL (ref 0.9–3.3)
LYMPH%: 20 % (ref 14.0–49.0)
MCH: 30.9 pg (ref 27.2–33.4)
MCHC: 33.7 g/dL (ref 32.0–36.0)
MCV: 91.7 fL (ref 79.3–98.0)
MONO#: 0.6 10*3/uL (ref 0.1–0.9)
MONO%: 8.1 % (ref 0.0–14.0)
NEUT#: 5.2 10*3/uL (ref 1.5–6.5)
NEUT%: 70 % (ref 39.0–75.0)
Platelets: 104 10*3/uL — ABNORMAL LOW (ref 140–400)
RBC: 5.07 10*6/uL (ref 4.20–5.82)
RDW: 13.4 % (ref 11.0–14.6)
WBC: 7.5 10*3/uL (ref 4.0–10.3)

## 2014-02-04 LAB — COMPREHENSIVE METABOLIC PANEL (CC13)
ALT: 22 U/L (ref 0–55)
AST: 26 U/L (ref 5–34)
Albumin: 3.5 g/dL (ref 3.5–5.0)
Alkaline Phosphatase: 69 U/L (ref 40–150)
Anion Gap: 11 mEq/L (ref 3–11)
BUN: 19.5 mg/dL (ref 7.0–26.0)
CALCIUM: 8.7 mg/dL (ref 8.4–10.4)
CHLORIDE: 107 meq/L (ref 98–109)
CO2: 24 mEq/L (ref 22–29)
Creatinine: 1 mg/dL (ref 0.7–1.3)
Glucose: 86 mg/dl (ref 70–140)
Potassium: 3.9 mEq/L (ref 3.5–5.1)
SODIUM: 142 meq/L (ref 136–145)
TOTAL PROTEIN: 6 g/dL — AB (ref 6.4–8.3)
Total Bilirubin: 1.65 mg/dL — ABNORMAL HIGH (ref 0.20–1.20)

## 2014-02-04 LAB — URIC ACID (CC13): Uric Acid, Serum: 8.3 mg/dl — ABNORMAL HIGH (ref 2.6–7.4)

## 2014-02-04 LAB — LACTATE DEHYDROGENASE (CC13): LDH: 193 U/L (ref 125–245)

## 2014-02-17 DIAGNOSIS — F0390 Unspecified dementia without behavioral disturbance: Secondary | ICD-10-CM | POA: Diagnosis not present

## 2014-02-26 ENCOUNTER — Other Ambulatory Visit: Payer: Self-pay | Admitting: *Deleted

## 2014-02-26 DIAGNOSIS — C8313 Mantle cell lymphoma, intra-abdominal lymph nodes: Secondary | ICD-10-CM

## 2014-02-26 MED ORDER — IBRUTINIB 140 MG PO CAPS: 420.0000 mg | ORAL_CAPSULE | Freq: Every day | ORAL | Status: DC

## 2014-02-26 NOTE — Telephone Encounter (Signed)
THIS REFILL REQUEST FOR IMBRUVICA WAS PLACED IN DR.GORSUCH'S ACTIVE WORK FOLDER.

## 2014-03-02 ENCOUNTER — Other Ambulatory Visit: Payer: Self-pay | Admitting: Hematology and Oncology

## 2014-03-02 DIAGNOSIS — C8313 Mantle cell lymphoma, intra-abdominal lymph nodes: Secondary | ICD-10-CM

## 2014-03-03 ENCOUNTER — Encounter: Payer: Self-pay | Admitting: Hematology and Oncology

## 2014-03-03 ENCOUNTER — Telehealth: Payer: Self-pay | Admitting: Hematology and Oncology

## 2014-03-03 ENCOUNTER — Other Ambulatory Visit (HOSPITAL_BASED_OUTPATIENT_CLINIC_OR_DEPARTMENT_OTHER): Payer: Medicare Other

## 2014-03-03 ENCOUNTER — Ambulatory Visit (HOSPITAL_BASED_OUTPATIENT_CLINIC_OR_DEPARTMENT_OTHER): Payer: Medicare Other | Admitting: Hematology and Oncology

## 2014-03-03 VITALS — BP 147/53 | HR 56 | Temp 97.5°F | Resp 18 | Ht 73.0 in | Wt 165.0 lb

## 2014-03-03 DIAGNOSIS — D696 Thrombocytopenia, unspecified: Secondary | ICD-10-CM | POA: Diagnosis not present

## 2014-03-03 DIAGNOSIS — F0390 Unspecified dementia without behavioral disturbance: Secondary | ICD-10-CM | POA: Diagnosis not present

## 2014-03-03 DIAGNOSIS — C8313 Mantle cell lymphoma, intra-abdominal lymph nodes: Secondary | ICD-10-CM

## 2014-03-03 LAB — LACTATE DEHYDROGENASE (CC13): LDH: 177 U/L (ref 125–245)

## 2014-03-03 LAB — COMPREHENSIVE METABOLIC PANEL (CC13)
ALK PHOS: 89 U/L (ref 40–150)
ALT: 23 U/L (ref 0–55)
AST: 22 U/L (ref 5–34)
Albumin: 3.8 g/dL (ref 3.5–5.0)
Anion Gap: 8 mEq/L (ref 3–11)
BILIRUBIN TOTAL: 1.47 mg/dL — AB (ref 0.20–1.20)
BUN: 16.4 mg/dL (ref 7.0–26.0)
CO2: 29 mEq/L (ref 22–29)
Calcium: 9.4 mg/dL (ref 8.4–10.4)
Chloride: 107 mEq/L (ref 98–109)
Creatinine: 1 mg/dL (ref 0.7–1.3)
Glucose: 103 mg/dl (ref 70–140)
Potassium: 4.1 mEq/L (ref 3.5–5.1)
SODIUM: 144 meq/L (ref 136–145)
TOTAL PROTEIN: 6.2 g/dL — AB (ref 6.4–8.3)

## 2014-03-03 LAB — CBC WITH DIFFERENTIAL/PLATELET
BASO%: 0.9 % (ref 0.0–2.0)
Basophils Absolute: 0.1 10*3/uL (ref 0.0–0.1)
EOS%: 1.2 % (ref 0.0–7.0)
Eosinophils Absolute: 0.1 10*3/uL (ref 0.0–0.5)
HEMATOCRIT: 46.5 % (ref 38.4–49.9)
HGB: 15.3 g/dL (ref 13.0–17.1)
LYMPH%: 24.8 % (ref 14.0–49.0)
MCH: 30.6 pg (ref 27.2–33.4)
MCHC: 32.8 g/dL (ref 32.0–36.0)
MCV: 93.1 fL (ref 79.3–98.0)
MONO#: 0.5 10*3/uL (ref 0.1–0.9)
MONO%: 8.4 % (ref 0.0–14.0)
NEUT#: 4.1 10*3/uL (ref 1.5–6.5)
NEUT%: 64.7 % (ref 39.0–75.0)
PLATELETS: 104 10*3/uL — AB (ref 140–400)
RBC: 5 10*6/uL (ref 4.20–5.82)
RDW: 13.7 % (ref 11.0–14.6)
WBC: 6.3 10*3/uL (ref 4.0–10.3)
lymph#: 1.6 10*3/uL (ref 0.9–3.3)

## 2014-03-03 LAB — URIC ACID (CC13): Uric Acid, Serum: 6.5 mg/dl (ref 2.6–7.4)

## 2014-03-03 NOTE — Telephone Encounter (Signed)
Gave pt appt for for lab and MD for MAy 2015

## 2014-03-03 NOTE — Progress Notes (Signed)
[facility] FOLLOW-UP progress notes  Patient Care Team: Kandice Hams, MD as PCP - General (Internal Medicine)  CHIEF COMPLAINTS/PURPOSE OF VISIT:  Recurrent mantle cell lymphoma  HISTORY OF PRESENTING ILLNESS:  Jeffrey Simmons 80 y.o. male was transferred to my care after his prior physician has left.  This patient was diagnosed with mantle cell lymphoma in February of 2010. He presented with increasing fatigue and abdominal fullness. CT scan showed splenomegaly, periportal lymphadenopathy, bilateral iliac adenopathy, and a dominant lymph node mass in the right pelvis adjacent to the right external iliac vessels measuring 4.8 x 2.3 cm. Additional right inguinal adenopathy 2.9 x 2.6 cm. Diagnosis established by right inguinal lymph node biopsy done 02/01/2009 which showed a CD5 positive large B-cell lymphoma CD20 positive CD5 and CD10 positive lambda light chain restriction. Bone marrow with atypical nodular lymphoid aggregates. Cytogenetic studies did show a population of cells with the 11;14 translocation consistent with mantle cell lymphoma. He was living in [place] at the time. He was treated with 4 cycles of Cytoxan vincristine prednisone and Rituxan followed by 4 cycles of Cytoxan Novantrone vincristine and prednisone through 07/12/2009. He achieved a complete response.  Most recent survey CT scan done 11/24/2013 showed a single area of slowly enlarging lymphadenopathy involving the right external iliac node compared with 8 08/26/2013 study. Spleen stable at upper limit of normal size. Based on these results, from February of 2015, he was started on Ibrutinib.  The patient has small background history of dementia appeared to be functioning well. He has not been experiencing any side effects such as fatigue or diarrhea. He denies any recent fever, chills, night sweats or abnormal weight loss  MEDICAL HISTORY: Dementia, GERD Past Medical History  Diagnosis Date   . Mantle cell lymphoma of intra-abdominal lymph nodes 03/04/2012    Dx 01/07/09 dominant mass R pelvis 4.8 x 2.3 cm  bx R inguinal node 02/01/09 Rx R-CVP x 4 then 4x CNovantrone VP    SURGICAL HISTORY: Past Surgical History  Procedure Laterality Date  . Replacement total knee  2008    SOCIAL HISTORY: History   Social History  . Marital Status: Married    Spouse Name: [name]    Number of Children: 2  . Years of Education: HS   Occupational History  . Retired    Social History Main Topics  . Smoking status: Former Smoker    Quit date: 11/26/1966  . Smokeless tobacco: Never Used  . Alcohol Use: Yes     Comment: 1 or 2 beers daily  . Drug Use: No  . Sexual Activity: Not on file   Other Topics Concern  . Not on file   Social History Narrative   Patient lives at home with his spouse.   Caffeine Use: 1/2 cup of caffeine daily    FAMILY HISTORY: Family History  Problem Relation Age of Onset  . Stroke Mother     ALLERGIES:  has No Known Allergies.  MEDICATIONS:  Current Outpatient Prescriptions  Medication Sig Dispense Refill  . aspirin 81 MG tablet Take 81 mg by mouth daily.      Marland Kitchen ibrutinib (IMBRUVICA) 140 MG capsul Take 3 capsules (420 mg total) by mouth daily.  90 capsule  0  . Memantine HCl (NAMENDA XR PO) Take 1 tablet by mouth daily. 28 mg      . Multiple Vitamins-Minerals (EYE-VITE PLUS LUTEIN) CAPS Take 1 capsule by mouth 2 (two) times daily.      Marland Kitchen  omeprazole (PRILOSEC) 20 MG capsule Take 20 mg by mouth daily.      . ondansetron (ZOFRAN ODT) 4 MG disintegrating tablet Take 1 tablet (4 mg total) by mouth every 8 (eight) hours as needed for nausea or vomiting.  20 tablet  6   No current facility-administered medications for this visit.    REVIEW OF SYSTEMS:   Constitutional: Denies fevers, chills or abnormal night sweats Eyes: Denies blurriness of vision, double vision or watery eyes Ears, nose, mouth, throat, and face: Denies mucositis or sore  throat Respiratory: Denies cough, dyspnea or wheezes Cardiovascular: Denies palpitation, chest discomfort or lower extremity swelling Gastrointestinal:  Denies nausea, heartburn or change in bowel habits Skin: Denies abnormal skin rashes Lymphatics: Denies new lymphadenopathy or easy bruising Neurological:Denies numbness, tingling or new weaknesses Behavioral/Psych: Mood is stable, no new changes  All other systems were reviewed with the patient and are negative.  PHYSICAL EXAMINATION: ECOG PERFORMANCE STATUS: 0 - Asymptomatic  Filed Vitals:   03/03/14 1234  BP: 147/53  Pulse: 56  Temp: 97.5 F (36.4 C)  Resp: 18   Filed Weights   03/03/14 1234  Weight: 165 lb (74.844 kg)    GENERAL:alert, no distress and comfortable SKIN: skin color, texture, turgor are normal, no rashes or significant lesions EYES: normal, conjunctiva are pink and non-injected, sclera clear OROPHARYNX:no exudate, normal lips, buccal mucosa, and tongue  NECK: supple, thyroid normal size, non-tender, without nodularity LYMPH:  no palpable lymphadenopathy in the cervical, axillary or inguinal LUNGS: clear to auscultation and percussion with normal breathing effort HEART: regular rate & rhythm and no murmurs without lower extremity edema ABDOMEN:abdomen soft, non-tender and normal bowel sounds Musculoskeletal:no cyanosis of digits and no clubbing  PSYCH: alert & oriented x 3 with fluent speech NEURO: no focal motor/sensory deficits  LABORATORY DATA:  I have reviewed the data as listed Lab Results  Component Value Date   WBC 6.3 03/03/2014   HGB 15.3 03/03/2014   HCT 46.5 03/03/2014   MCV 93.1 03/03/2014   PLT 104* 03/03/2014    Recent Labs  11/24/13 1022 02/04/14 1056 03/03/14 1156  NA 141 142 144  K 4.6 3.9 4.1  CO2 27 24 29   GLUCOSE 96 86 103  BUN 13.9 19.5 16.4  CREATININE 1.1 1.0 1.0  CALCIUM 9.8 8.7 9.4  PROT 7.5 6.0* 6.2*  ALBUMIN 4.4 3.5 3.8  AST 34 26 22  ALT 37 22 23  ALKPHOS 121 69 89   BILITOT 1.40* 1.65* 1.47*   ASSESSMENT & PLAN:  #1 Mantle cell lymphoma He appears to be tolerating chemotherapy well. I plan to see him back in a month with results of repeat imaging study to assess response to treatment #2 dementia He appears to be functioning well under the care of his wife and family members #3 elevated total bilirubin I suspect this is due to Gilbert's syndrome. I recommend observation only. #4 Mild thrombocytopenia This is likely due to recent treatment. The patient denies recent history of bleeding such as epistaxis, hematuria or hematochezia. He is asymptomatic from the low platelet count. I will observe for now.  he does not require transfusion now. I will continue the chemotherapy at current dose without dosage adjustment.  If the thrombocytopenia gets progressive worse in the future, I might have to delay his treatment or adjust the chemotherapy dose.   Orders Placed This Encounter  Procedures  . CBC with Differential    Standing Status: Future     Number of  Occurrences:      Standing Expiration Date: 03/03/2015  . Comprehensive metabolic panel    Standing Status: Future     Number of Occurrences:      Standing Expiration Date: 03/03/2015  . Lactate dehydrogenase    Standing Status: Future     Number of Occurrences:      Standing Expiration Date: 03/03/2015    All questions were answered. The patient knows to call the clinic with any problems, questions or concerns. I spent 25 minutes counseling the patient face to face. The total time spent in the appointment was 30 minutes and more than 50% was on counseling.     Heath Lark, MD 03/03/2014 9:35 PM

## 2014-03-04 NOTE — Telephone Encounter (Signed)
RECEIVED A FAX FROM [facility]ERNING A CONFIRMATION OF PRESCRIPTION SHIPMENT FOR [place] ON 03/03/14.

## 2014-03-08 ENCOUNTER — Other Ambulatory Visit: Payer: Medicare Other

## 2014-03-17 ENCOUNTER — Telehealth: Payer: Self-pay | Admitting: *Deleted

## 2014-03-17 ENCOUNTER — Other Ambulatory Visit: Payer: Self-pay | Admitting: *Deleted

## 2014-03-17 MED ORDER — AMOXICILLIN-POT CLAVULANATE 875-125 MG PO TABS: 1.0000 | ORAL_TABLET | Freq: Two times a day (BID) | ORAL | Status: DC

## 2014-03-17 NOTE — Telephone Encounter (Signed)
Wife states patient has had non productive cough and sore throat X 3 days. Denies fever. States cough is waking him up at night. Recommended warm salt water rinses for sore throat and RN will check with MD regarding cough. Pt was concerned about taking any meds due to taking Ibrutinib.

## 2014-03-17 NOTE — Telephone Encounter (Signed)
Wife left a voice mail. Pt has a cold/sore throat. Would like to know what to take. Returned call-no answer- requested pt to call back

## 2014-03-17 NOTE — Telephone Encounter (Signed)
Pt/wife notified that Dr Alvy Bimler wants him to take Augmentin twice a day for 7 days, use OTC Robitussin and call us back on Monday with an update on how he is doing. To call us sooner if he develops fever or gets worse.

## 2014-03-29 ENCOUNTER — Telehealth: Payer: Self-pay | Admitting: *Deleted

## 2014-03-29 NOTE — Telephone Encounter (Signed)
Instructed pt Dr. Lurlean Leyden suspects his symptoms are probably due to virus.  He can continue to use salt water gargles and OTC cough medication.  Call us back if he develops fever or symptoms worsen. He verbalized understanding.

## 2014-03-29 NOTE — Telephone Encounter (Signed)
Pt reports his coughing/sore throat improved for about a week after completing antibiotic.  Yesterday his sore throat returned along w/ non productive cough.  He is gargling w/ warm salt water and using OTC cough medication.  He denies any fevers.  Asks if anything else he should be doing?  He has CT chest scheduled later this week on 5/08.  His next office visit w/ Dr. Alvy Bimler is on 5/11.

## 2014-04-01 ENCOUNTER — Other Ambulatory Visit: Payer: Self-pay | Admitting: *Deleted

## 2014-04-01 DIAGNOSIS — C8313 Mantle cell lymphoma, intra-abdominal lymph nodes: Secondary | ICD-10-CM

## 2014-04-01 MED ORDER — IBRUTINIB 140 MG PO CAPS: 420.0000 mg | ORAL_CAPSULE | Freq: Every day | ORAL | Status: DC

## 2014-04-02 ENCOUNTER — Encounter (HOSPITAL_COMMUNITY): Payer: Self-pay

## 2014-04-02 ENCOUNTER — Ambulatory Visit (HOSPITAL_COMMUNITY)
Admission: RE | Admit: 2014-04-02 | Discharge: 2014-04-02 | Disposition: A | Discharge status: Home / Self Care | Payer: Medicare Other | Source: Ambulatory Visit | Attending: Oncology | Admitting: Oncology

## 2014-04-02 DIAGNOSIS — K5730 Diverticulosis of large intestine without perforation or abscess without bleeding: Secondary | ICD-10-CM | POA: Diagnosis not present

## 2014-04-02 DIAGNOSIS — Z9221 Personal history of antineoplastic chemotherapy: Secondary | ICD-10-CM | POA: Diagnosis not present

## 2014-04-02 DIAGNOSIS — C8313 Mantle cell lymphoma, intra-abdominal lymph nodes: Secondary | ICD-10-CM | POA: Insufficient documentation

## 2014-04-02 DIAGNOSIS — R599 Enlarged lymph nodes, unspecified: Secondary | ICD-10-CM | POA: Diagnosis not present

## 2014-04-02 DIAGNOSIS — K4020 Bilateral inguinal hernia, without obstruction or gangrene, not specified as recurrent: Secondary | ICD-10-CM | POA: Diagnosis not present

## 2014-04-02 MED ORDER — IOHEXOL 300 MG/ML  SOLN
100.0000 mL | Freq: Once | INTRAMUSCULAR | Status: AC | PRN
  Administered 2014-04-02: 100 mL via INTRAVENOUS

## 2014-04-05 ENCOUNTER — Telehealth: Payer: Self-pay | Admitting: Hematology and Oncology

## 2014-04-05 ENCOUNTER — Ambulatory Visit (HOSPITAL_BASED_OUTPATIENT_CLINIC_OR_DEPARTMENT_OTHER): Payer: Medicare Other | Admitting: Hematology and Oncology

## 2014-04-05 ENCOUNTER — Other Ambulatory Visit (HOSPITAL_BASED_OUTPATIENT_CLINIC_OR_DEPARTMENT_OTHER): Payer: Medicare Other

## 2014-04-05 VITALS — BP 123/50 | HR 63 | Temp 97.5°F | Resp 18 | Ht 73.0 in | Wt 166.4 lb

## 2014-04-05 DIAGNOSIS — R197 Diarrhea, unspecified: Secondary | ICD-10-CM | POA: Diagnosis not present

## 2014-04-05 DIAGNOSIS — D696 Thrombocytopenia, unspecified: Secondary | ICD-10-CM

## 2014-04-05 DIAGNOSIS — F0390 Unspecified dementia without behavioral disturbance: Secondary | ICD-10-CM

## 2014-04-05 DIAGNOSIS — C8313 Mantle cell lymphoma, intra-abdominal lymph nodes: Secondary | ICD-10-CM

## 2014-04-05 LAB — CBC WITH DIFFERENTIAL/PLATELET
BASO%: 0.6 % (ref 0.0–2.0)
BASOS ABS: 0 10*3/uL (ref 0.0–0.1)
EOS ABS: 0.1 10*3/uL (ref 0.0–0.5)
EOS%: 0.7 % (ref 0.0–7.0)
HCT: 45.4 % (ref 38.4–49.9)
HEMOGLOBIN: 15 g/dL (ref 13.0–17.1)
LYMPH#: 1.5 10*3/uL (ref 0.9–3.3)
LYMPH%: 18.1 % (ref 14.0–49.0)
MCH: 30.2 pg (ref 27.2–33.4)
MCHC: 33.1 g/dL (ref 32.0–36.0)
MCV: 91.3 fL (ref 79.3–98.0)
MONO#: 0.6 10*3/uL (ref 0.1–0.9)
MONO%: 6.8 % (ref 0.0–14.0)
NEUT%: 73.8 % (ref 39.0–75.0)
NEUTROS ABS: 6.2 10*3/uL (ref 1.5–6.5)
Platelets: 107 10*3/uL — ABNORMAL LOW (ref 140–400)
RBC: 4.98 10*6/uL (ref 4.20–5.82)
RDW: 13.6 % (ref 11.0–14.6)
WBC: 8.3 10*3/uL (ref 4.0–10.3)

## 2014-04-05 LAB — COMPREHENSIVE METABOLIC PANEL (CC13)
ALBUMIN: 3.6 g/dL (ref 3.5–5.0)
ALT: 36 U/L (ref 0–55)
ANION GAP: 9 meq/L (ref 3–11)
AST: 26 U/L (ref 5–34)
Alkaline Phosphatase: 90 U/L (ref 40–150)
BUN: 16.9 mg/dL (ref 7.0–26.0)
CHLORIDE: 106 meq/L (ref 98–109)
CO2: 26 meq/L (ref 22–29)
CREATININE: 1.1 mg/dL (ref 0.7–1.3)
Calcium: 9 mg/dL (ref 8.4–10.4)
GLUCOSE: 112 mg/dL (ref 70–140)
POTASSIUM: 4 meq/L (ref 3.5–5.1)
Sodium: 141 mEq/L (ref 136–145)
Total Bilirubin: 0.94 mg/dL (ref 0.20–1.20)
Total Protein: 6.1 g/dL — ABNORMAL LOW (ref 6.4–8.3)

## 2014-04-05 LAB — LACTATE DEHYDROGENASE (CC13): LDH: 187 U/L (ref 125–245)

## 2014-04-05 NOTE — Progress Notes (Signed)
[facility] OFFICE PROGRESS NOTE  Patient Care Team: Kandice Hams, MD as PCP - General (Internal Medicine)  DIAGNOSIS: Recurrent mantle cell lymphoma, with good response to Ibrutinib SUMMARY OF ONCOLOGIC HISTORY: This patient was diagnosed with mantle cell lymphoma in February of 2010. He presented with increasing fatigue and abdominal fullness. CT scan showed splenomegaly, periportal lymphadenopathy, bilateral iliac adenopathy, and a dominant lymph node mass in the right pelvis adjacent to the right external iliac vessels measuring 4.8 x 2.3 cm. Additional right inguinal adenopathy 2.9 x 2.6 cm. Diagnosis established by right inguinal lymph node biopsy done 02/01/2009 which showed a CD5 positive large B-cell lymphoma CD20 positive CD5 and CD10 positive lambda light chain restriction. Bone marrow with atypical nodular lymphoid aggregates. Cytogenetic studies did show a population of cells with the 11;14 translocation consistent with mantle cell lymphoma. He was living in [place] at the time. He was treated with 4 cycles of Cytoxan vincristine prednisone and Rituxan followed by 4 cycles of Cytoxan Novantrone vincristine and prednisone through 07/12/2009. He achieved a complete response.  Most recent survey CT scan done 11/24/2013 showed a single area of slowly enlarging lymphadenopathy involving the right external iliac node compared with 8 08/26/2013 study. Spleen stable at upper limit of normal size. Based on these results, from February of 2015, he was started on Ibrutinib. On May 2015, repeat CT scan showed near complete response to treatment. INTERVAL HISTORY: Jeffrey Simmons 80 y.o. male returns for further followup. The patient has history of dementia but appeared to be functioning well. He had some mild diarrhea recently, related to CT scan contrast. He denies any recent fever, chills, night sweats or abnormal weight loss The patient denies any recent signs or  symptoms of bleeding such as spontaneous epistaxis, hematuria or hematochezia. He complained of some soreness at the left side of the tongue. The patient wear dentures. It is not interfering with his ability to eat or drink.  I have reviewed the past medical history, past surgical history, social history and family history with the patient and they are unchanged from previous note.  ALLERGIES:  has No Known Allergies.  MEDICATIONS:  Current Outpatient Prescriptions  Medication Sig Dispense Refill  . aspirin 81 MG tablet Take 81 mg by mouth daily.      Marland Kitchen ibrutinib (IMBRUVICA) 140 MG capsul Take 3 capsules (420 mg total) by mouth daily.  90 capsule  0  . Memantine HCl (NAMENDA XR PO) Take 1 tablet by mouth daily. 28 mg      . Multiple Vitamins-Minerals (EYE-VITE PLUS LUTEIN) CAPS Take 1 capsule by mouth 2 (two) times daily.      Marland Kitchen omeprazole (PRILOSEC) 20 MG capsule Take 20 mg by mouth daily.      . ondansetron (ZOFRAN ODT) 4 MG disintegrating tablet Take 1 tablet (4 mg total) by mouth every 8 (eight) hours as needed for nausea or vomiting.  20 tablet  6   No current facility-administered medications for this visit.    REVIEW OF SYSTEMS:   Constitutional: Denies fevers, chills or abnormal weight loss Eyes: Denies blurriness of vision Respiratory: Denies cough, dyspnea or wheezes Cardiovascular: Denies palpitation, chest discomfort or lower extremity swelling Skin: Denies abnormal skin rashes Lymphatics: Denies new lymphadenopathy or easy bruising Neurological:Denies numbness, tingling or new weaknesses Behavioral/Psych: Mood is stable, no new changes  All other systems were reviewed with the patient and are negative.  PHYSICAL EXAMINATION: ECOG PERFORMANCE STATUS: 1 - Symptomatic but completely  ambulatory  Filed Vitals:   04/05/14 1235  BP: 123/50  Pulse: 63  Temp: 97.5 F (36.4 C)  Resp: 18   Filed Weights   04/05/14 1235  Weight: 166 lb 6.4 oz (75.479 kg)     GENERAL:alert, no distress and comfortable SKIN: skin color, texture, turgor are normal, no rashes or significant lesions EYES: normal, Conjunctiva are pink and non-injected, sclera clear OROPHARYNX:no exudate, no erythema and lips, buccal mucosa, and tongue normal . No lesions in his tongue is noted NECK: supple, thyroid normal size, non-tender, without nodularity LYMPH:  no palpable lymphadenopathy in the cervical, axillary or inguinal LUNGS: clear to auscultation and percussion with normal breathing effort HEART: regular rate & rhythm and no murmurs and no lower extremity edema ABDOMEN:abdomen soft, non-tender and normal bowel sounds Musculoskeletal:no cyanosis of digits and no clubbing  NEURO: alert & oriented x 3 with fluent speech, no focal motor/sensory deficits  LABORATORY DATA:  I have reviewed the data as listed    Component Value Date/Time   NA 144 03/03/2014 1156   NA 145 02/27/2012 1158   NA 141 07/25/2011 1438   NA 141 07/25/2011 1438   K 4.1 03/03/2014 1156   K 4.4 02/27/2012 1158   K 4.3 07/25/2011 1438   K 4.3 07/25/2011 1438   CL 103 02/24/2013 0923   CL 102 02/27/2012 1158   CL 105 07/25/2011 1438   CL 105 07/25/2011 1438   CO2 29 03/03/2014 1156   CO2 30 02/27/2012 1158   CO2 28 07/25/2011 1438   CO2 28 07/25/2011 1438   GLUCOSE 103 03/03/2014 1156   GLUCOSE 98 02/24/2013 0923   GLUCOSE 96 02/27/2012 1158   GLUCOSE 83 07/25/2011 1438   GLUCOSE 83 07/25/2011 1438   BUN 16.4 03/03/2014 1156   BUN 14 02/27/2012 1158   BUN 19 07/25/2011 1438   BUN 19 07/25/2011 1438   CREATININE 1.0 03/03/2014 1156   CREATININE 1.0 02/27/2012 1158   CREATININE 0.98 07/25/2011 1438   CREATININE 0.98 07/25/2011 1438   CALCIUM 9.4 03/03/2014 1156   CALCIUM 8.7 02/27/2012 1158   CALCIUM 8.6 07/25/2011 1438   CALCIUM 8.6 07/25/2011 1438   PROT 6.2* 03/03/2014 1156   PROT 6.8 02/27/2012 1158   PROT 5.8* 07/25/2011 1438   PROT 5.8* 07/25/2011 1438   ALBUMIN 3.8 03/03/2014 1156   ALBUMIN 4.3 07/25/2011 1438   ALBUMIN 4.3  07/25/2011 1438   AST 22 03/03/2014 1156   AST 33 02/27/2012 1158   AST 19 07/25/2011 1438   AST 19 07/25/2011 1438   ALT 23 03/03/2014 1156   ALT 26 02/27/2012 1158   ALT 15 07/25/2011 1438   ALT 15 07/25/2011 1438   ALKPHOS 89 03/03/2014 1156   ALKPHOS 105* 02/27/2012 1158   ALKPHOS 88 07/25/2011 1438   ALKPHOS 88 07/25/2011 1438   BILITOT 1.47* 03/03/2014 1156   BILITOT 1.10 02/27/2012 1158   BILITOT 1.0 07/25/2011 1438   BILITOT 1.0 07/25/2011 1438    No results found for this basename: SPEP,  UPEP,   kappa and lambda light chains    Lab Results  Component Value Date   WBC 8.3 04/05/2014   NEUTROABS 6.2 04/05/2014   HGB 15.0 04/05/2014   HCT 45.4 04/05/2014   MCV 91.3 04/05/2014   PLT 107* 04/05/2014      Chemistry      Component Value Date/Time   NA 144 03/03/2014 1156   NA 145 02/27/2012 1158   NA 141  07/25/2011 1438   NA 141 07/25/2011 1438   K 4.1 03/03/2014 1156   K 4.4 02/27/2012 1158   K 4.3 07/25/2011 1438   K 4.3 07/25/2011 1438   CL 103 02/24/2013 0923   CL 102 02/27/2012 1158   CL 105 07/25/2011 1438   CL 105 07/25/2011 1438   CO2 29 03/03/2014 1156   CO2 30 02/27/2012 1158   CO2 28 07/25/2011 1438   CO2 28 07/25/2011 1438   BUN 16.4 03/03/2014 1156   BUN 14 02/27/2012 1158   BUN 19 07/25/2011 1438   BUN 19 07/25/2011 1438   CREATININE 1.0 03/03/2014 1156   CREATININE 1.0 02/27/2012 1158   CREATININE 0.98 07/25/2011 1438   CREATININE 0.98 07/25/2011 1438      Component Value Date/Time   CALCIUM 9.4 03/03/2014 1156   CALCIUM 8.7 02/27/2012 1158   CALCIUM 8.6 07/25/2011 1438   CALCIUM 8.6 07/25/2011 1438   ALKPHOS 89 03/03/2014 1156   ALKPHOS 105* 02/27/2012 1158   ALKPHOS 88 07/25/2011 1438   ALKPHOS 88 07/25/2011 1438   AST 22 03/03/2014 1156   AST 33 02/27/2012 1158   AST 19 07/25/2011 1438   AST 19 07/25/2011 1438   ALT 23 03/03/2014 1156   ALT 26 02/27/2012 1158   ALT 15 07/25/2011 1438   ALT 15 07/25/2011 1438   BILITOT 1.47* 03/03/2014 1156   BILITOT 1.10 02/27/2012 1158   BILITOT 1.0 07/25/2011 1438   BILITOT  1.0 07/25/2011 1438     RADIOGRAPHIC STUDIES: I reviewed the imaging study with him and his wife which show excellent response to treatment. I have personally reviewed the radiological images as listed and agreed with the findings in the report.  ASSESSMENT & PLAN:  #1 Mantle cell lymphoma He appears to be tolerating chemotherapy well. I plan to see him back in a month with history, physical examination and blood work. I will order another imaging study in 6 months unless there are clinical signs and symptoms to suggest disease progression. #2 dementia He appears to be functioning well under the care of his wife and family members #3 elevated total bilirubin I suspect this is due to Gilbert's syndrome. I recommend observation only. #4 Mild thrombocytopenia This is likely due to recent treatment. The patient denies recent history of bleeding such as epistaxis, hematuria or hematochezia. He is asymptomatic from the low platelet count. I will observe for now.  he does not require transfusion now. I will continue the chemotherapy at current dose without dosage adjustment.  If the thrombocytopenia gets progressive worse in the future, I might have to delay his treatment or adjust the chemotherapy dose. #5 mild diarrhea I suspect this is due to recent CT contrast. Even though the chemotherapy can get diarrhea, it is not severe according to his wife and she does not think it is related to his chemotherapy. #6 soreness on his tongue The patient wear dentures. Clinically, there are no signs of mucositis. I recommend he consult his dentist for further evaluation to make sure that this is not related to an ill fitting dentures. Orders Placed This Encounter  Procedures  . CBC with Differential    Standing Status: Standing     Number of Occurrences: 9     Standing Expiration Date: 04/06/2015  . Comprehensive metabolic panel    Standing Status: Standing     Number of Occurrences: 9     Standing  Expiration Date: 04/06/2015  . Lactate dehydrogenase  Standing Status: Standing     Number of Occurrences: 9     Standing Expiration Date: 04/06/2015   All questions were answered. The patient knows to call the clinic with any problems, questions or concerns. No barriers to learning was detected.   Heath Lark, MD 04/05/2014 12:57 PM

## 2014-04-05 NOTE — Telephone Encounter (Signed)
Gave pt appt for lab and MD for june 2015

## 2014-04-30 ENCOUNTER — Other Ambulatory Visit: Payer: Self-pay | Admitting: *Deleted

## 2014-04-30 DIAGNOSIS — C8313 Mantle cell lymphoma, intra-abdominal lymph nodes: Secondary | ICD-10-CM

## 2014-04-30 MED ORDER — IBRUTINIB 140 MG PO CAPS: 420.0000 mg | ORAL_CAPSULE | Freq: Every day | ORAL | Status: DC

## 2014-04-30 NOTE — Telephone Encounter (Signed)
This refill request for imbruvica was placed in Dr. Calton Dach active work bin.

## 2014-04-30 NOTE — Addendum Note (Signed)
Addended by: Wyonia Hough on: 04/30/2014 03:57 PM   Modules accepted: Orders

## 2014-05-04 ENCOUNTER — Ambulatory Visit (HOSPITAL_BASED_OUTPATIENT_CLINIC_OR_DEPARTMENT_OTHER): Payer: Medicare Other | Admitting: Hematology and Oncology

## 2014-05-04 ENCOUNTER — Encounter: Payer: Self-pay | Admitting: Hematology and Oncology

## 2014-05-04 ENCOUNTER — Other Ambulatory Visit (HOSPITAL_BASED_OUTPATIENT_CLINIC_OR_DEPARTMENT_OTHER): Payer: Medicare Other

## 2014-05-04 VITALS — BP 143/64 | HR 58 | Temp 97.3°F | Resp 20 | Ht 73.0 in | Wt 170.0 lb

## 2014-05-04 DIAGNOSIS — T148XXA Other injury of unspecified body region, initial encounter: Secondary | ICD-10-CM | POA: Insufficient documentation

## 2014-05-04 DIAGNOSIS — C8313 Mantle cell lymphoma, intra-abdominal lymph nodes: Secondary | ICD-10-CM

## 2014-05-04 DIAGNOSIS — F0390 Unspecified dementia without behavioral disturbance: Secondary | ICD-10-CM

## 2014-05-04 DIAGNOSIS — D696 Thrombocytopenia, unspecified: Secondary | ICD-10-CM | POA: Insufficient documentation

## 2014-05-04 DIAGNOSIS — T451X5A Adverse effect of antineoplastic and immunosuppressive drugs, initial encounter: Secondary | ICD-10-CM

## 2014-05-04 DIAGNOSIS — F03B0 Unspecified dementia, moderate, without behavioral disturbance, psychotic disturbance, mood disturbance, and anxiety: Secondary | ICD-10-CM

## 2014-05-04 LAB — CBC WITH DIFFERENTIAL/PLATELET
BASO%: 0.8 % (ref 0.0–2.0)
Basophils Absolute: 0.1 10*3/uL (ref 0.0–0.1)
EOS%: 1 % (ref 0.0–7.0)
Eosinophils Absolute: 0.1 10*3/uL (ref 0.0–0.5)
HCT: 47.9 % (ref 38.4–49.9)
HGB: 15.7 g/dL (ref 13.0–17.1)
LYMPH%: 22.5 % (ref 14.0–49.0)
MCH: 29.9 pg (ref 27.2–33.4)
MCHC: 32.8 g/dL (ref 32.0–36.0)
MCV: 91.2 fL (ref 79.3–98.0)
MONO#: 0.5 10*3/uL (ref 0.1–0.9)
MONO%: 6.4 % (ref 0.0–14.0)
NEUT%: 69.3 % (ref 39.0–75.0)
NEUTROS ABS: 5.3 10*3/uL (ref 1.5–6.5)
Platelets: 101 10*3/uL — ABNORMAL LOW (ref 140–400)
RBC: 5.25 10*6/uL (ref 4.20–5.82)
RDW: 14.3 % (ref 11.0–14.6)
WBC: 7.6 10*3/uL (ref 4.0–10.3)
lymph#: 1.7 10*3/uL (ref 0.9–3.3)

## 2014-05-04 LAB — COMPREHENSIVE METABOLIC PANEL (CC13)
ALK PHOS: 91 U/L (ref 40–150)
ALT: 51 U/L (ref 0–55)
AST: 35 U/L — ABNORMAL HIGH (ref 5–34)
Albumin: 3.7 g/dL (ref 3.5–5.0)
Anion Gap: 9 mEq/L (ref 3–11)
BILIRUBIN TOTAL: 1.32 mg/dL — AB (ref 0.20–1.20)
BUN: 16.6 mg/dL (ref 7.0–26.0)
CO2: 27 mEq/L (ref 22–29)
Calcium: 8.8 mg/dL (ref 8.4–10.4)
Chloride: 106 mEq/L (ref 98–109)
Creatinine: 1.3 mg/dL (ref 0.7–1.3)
Glucose: 97 mg/dl (ref 70–140)
Potassium: 4.3 mEq/L (ref 3.5–5.1)
SODIUM: 142 meq/L (ref 136–145)
TOTAL PROTEIN: 6.4 g/dL (ref 6.4–8.3)

## 2014-05-04 LAB — LACTATE DEHYDROGENASE (CC13): LDH: 188 U/L (ref 125–245)

## 2014-05-04 NOTE — Assessment & Plan Note (Signed)
This is likely due to recent treatment. The patient denies recent history of bleeding such as epistaxis, hematuria or hematochezia. He is asymptomatic from the low platelet count. I will observe for now.  he does not require transfusion now. I will continue the chemotherapy at current dose without dosage adjustment.  If the thrombocytopenia gets progressive worse in the future, I might have to delay his treatment or adjust the chemotherapy dose.   

## 2014-05-04 NOTE — Assessment & Plan Note (Signed)
Most recent CT scan showed complete response to treatment. He tolerated treatment very well with no side effects apart from minor bruising. I will see him every 3 months with history, physical examination and blood work. He will continue on chemotherapy indefinitely. I plan on repeat imaging study in November 2015, 6 months away from the last CT scan.

## 2014-05-04 NOTE — Progress Notes (Signed)
[facility] OFF[facility] PROGRESS NOTE  Patient Care Team: Lajean Manes, MD as PCP - General (Internal Medicine)  SUMMARY OF ONCOLOGIC HISTORY:  DIAGNOSIS: Recurrent mantle cell lymphoma, with good response to Ibrutinib SUMMARY OF ONCOLOGIC HISTORY: This patient was diagnosed with mantle cell lymphoma in February of 2010. He presented with increasing fatigue and abdominal fullness. CT scan showed splenomegaly, periportal lymphadenopathy, bilateral iliac adenopathy, and a dominant lymph node mass in the right pelvis adjacent to the right external iliac vessels measuring 4.8 x 2.3 cm. Additional right inguinal adenopathy 2.9 x 2.6 cm. Diagnosis established by right inguinal lymph node biopsy done 02/01/2009 which showed a CD5 positive large B-cell lymphoma CD20 positive CD5 and CD10 positive lambda light chain restriction. Bone marrow with atypical nodular lymphoid aggregates. Cytogenetic studies did show a population of cells with the 11;14 translocation consistent with mantle cell lymphoma. He was living in [place] at the time. He was treated with 4 cycles of Cytoxan vincristine prednisone and Rituxan followed by 4 cycles of Cytoxan Novantrone vincristine and prednisone through 07/12/2009. He achieved a complete response.  Most recent survey CT scan done 11/24/2013 showed a single area of slowly enlarging lymphadenopathy involving the right external iliac node compared with 8 08/26/2013 study. Spleen stable at upper limit of normal size. Based on these results, from February of 2015, he was started on Ibrutinib. On May 2015, repeat CT scan showed near complete response to treatment.  INTERVAL HISTORY: Please see below for problem oriented charting. He reported no new symptoms apart from mild bruising.  REVIEW OF SYSTEMS:   Constitutional: Denies fevers, chills or abnormal weight loss Eyes: Denies blurriness of vision Ears, nose, mouth, throat, and face: Denies mucositis  or sore throat Respiratory: Denies cough, dyspnea or wheezes Cardiovascular: Denies palpitation, chest discomfort or lower extremity swelling Gastrointestinal:  Denies nausea, heartburn or change in bowel habits Lymphatics: Denies new lymphadenopathy or easy bruising Neurological:Denies numbness, tingling or new weaknesses Behavioral/Psych: Mood is stable, no new changes  All other systems were reviewed with the patient and are negative.  I have reviewed the past medical history, past surgical history, social history and family history with the patient and they are unchanged from previous note.  ALLERGIES:  has No Known Allergies.  MEDICATIONS:  Current Outpatient Prescriptions  Medication Sig Dispense Refill  . aspirin 81 MG tablet Take 81 mg by mouth daily.      Marland Kitchen ibrutinib (IMBRUVICA) 140 MG capsul Take 3 capsules (420 mg total) by mouth daily.  90 capsule  3  . Memantine HCl (NAMENDA XR PO) Take 1 tablet by mouth daily. 28 mg      . Multiple Vitamins-Minerals (EYE-VITE PLUS LUTEIN) CAPS Take 1 capsule by mouth 2 (two) times daily.      Marland Kitchen omeprazole (PRILOSEC) 20 MG capsule Take 20 mg by mouth daily.      . ondansetron (ZOFRAN ODT) 4 MG disintegrating tablet Take 1 tablet (4 mg total) by mouth every 8 (eight) hours as needed for nausea or vomiting.  20 tablet  6   No current facility-administered medications for this visit.    PHYSICAL EXAMINATION: ECOG PERFORMANCE STATUS: 1 - Symptomatic but completely ambulatory  Filed Vitals:   05/04/14 1342  BP: 143/64  Pulse: 58  Temp: 97.3 F (36.3 C)  Resp: 20   Filed Weights   05/04/14 1342  Weight: 170 lb (77.111 kg)    GENERAL:alert, no distress and comfortable SKIN: Minor bruising is noted. No  petechiae rash. EYES: normal, Conjunctiva are pink and non-injected, sclera clear OROPHARYNX:no exudate, no erythema and lips, buccal mucosa, and tongue normal  NECK: supple, thyroid normal size, non-tender, without nodularity LYMPH:   no palpable lymphadenopathy in the cervical, axillary or inguinal LUNGS: clear to auscultation and percussion with normal breathing effort HEART: regular rate & rhythm and no murmurs and no lower extremity edema ABDOMEN:abdomen soft, non-tender and normal bowel sounds Musculoskeletal:no cyanosis of digits and no clubbing  NEURO: alert & oriented x 3 with fluent speech, no focal motor/sensory deficits  LABORATORY DATA:  I have reviewed the data as listed    Component Value Date/Time   NA 142 05/04/2014 1327   NA 145 02/27/2012 1158   NA 141 07/25/2011 1438   NA 141 07/25/2011 1438   K 4.3 05/04/2014 1327   K 4.4 02/27/2012 1158   K 4.3 07/25/2011 1438   K 4.3 07/25/2011 1438   CL 103 02/24/2013 0923   CL 102 02/27/2012 1158   CL 105 07/25/2011 1438   CL 105 07/25/2011 1438   CO2 27 05/04/2014 1327   CO2 30 02/27/2012 1158   CO2 28 07/25/2011 1438   CO2 28 07/25/2011 1438   GLUCOSE 97 05/04/2014 1327   GLUCOSE 98 02/24/2013 0923   GLUCOSE 96 02/27/2012 1158   GLUCOSE 83 07/25/2011 1438   GLUCOSE 83 07/25/2011 1438   BUN 16.6 05/04/2014 1327   BUN 14 02/27/2012 1158   BUN 19 07/25/2011 1438   BUN 19 07/25/2011 1438   CREATININE 1.3 05/04/2014 1327   CREATININE 1.0 02/27/2012 1158   CREATININE 0.98 07/25/2011 1438   CREATININE 0.98 07/25/2011 1438   CALCIUM 8.8 05/04/2014 1327   CALCIUM 8.7 02/27/2012 1158   CALCIUM 8.6 07/25/2011 1438   CALCIUM 8.6 07/25/2011 1438   PROT 6.4 05/04/2014 1327   PROT 6.8 02/27/2012 1158   PROT 5.8* 07/25/2011 1438   PROT 5.8* 07/25/2011 1438   ALBUMIN 3.7 05/04/2014 1327   ALBUMIN 4.3 07/25/2011 1438   ALBUMIN 4.3 07/25/2011 1438   AST 35* 05/04/2014 1327   AST 33 02/27/2012 1158   AST 19 07/25/2011 1438   AST 19 07/25/2011 1438   ALT 51 05/04/2014 1327   ALT 26 02/27/2012 1158   ALT 15 07/25/2011 1438   ALT 15 07/25/2011 1438   ALKPHOS 91 05/04/2014 1327   ALKPHOS 105* 02/27/2012 1158   ALKPHOS 88 07/25/2011 1438   ALKPHOS 88 07/25/2011 1438   BILITOT 1.32* 05/04/2014 1327   BILITOT 1.10 02/27/2012 1158    BILITOT 1.0 07/25/2011 1438   BILITOT 1.0 07/25/2011 1438    No results found for this basename: SPEP,  UPEP,   kappa and lambda light chains    Lab Results  Component Value Date   WBC 7.6 05/04/2014   NEUTROABS 5.3 05/04/2014   HGB 15.7 05/04/2014   HCT 47.9 05/04/2014   MCV 91.2 05/04/2014   PLT 101* 05/04/2014      Chemistry      Component Value Date/Time   NA 142 05/04/2014 1327   NA 145 02/27/2012 1158   NA 141 07/25/2011 1438   NA 141 07/25/2011 1438   K 4.3 05/04/2014 1327   K 4.4 02/27/2012 1158   K 4.3 07/25/2011 1438   K 4.3 07/25/2011 1438   CL 103 02/24/2013 0923   CL 102 02/27/2012 1158   CL 105 07/25/2011 1438   CL 105 07/25/2011 1438   CO2 27 05/04/2014 1327  CO2 30 02/27/2012 1158   CO2 28 07/25/2011 1438   CO2 28 07/25/2011 1438   BUN 16.6 05/04/2014 1327   BUN 14 02/27/2012 1158   BUN 19 07/25/2011 1438   BUN 19 07/25/2011 1438   CREATININE 1.3 05/04/2014 1327   CREATININE 1.0 02/27/2012 1158   CREATININE 0.98 07/25/2011 1438   CREATININE 0.98 07/25/2011 1438      Component Value Date/Time   CALCIUM 8.8 05/04/2014 1327   CALCIUM 8.7 02/27/2012 1158   CALCIUM 8.6 07/25/2011 1438   CALCIUM 8.6 07/25/2011 1438   ALKPHOS 91 05/04/2014 1327   ALKPHOS 105* 02/27/2012 1158   ALKPHOS 88 07/25/2011 1438   ALKPHOS 88 07/25/2011 1438   AST 35* 05/04/2014 1327   AST 33 02/27/2012 1158   AST 19 07/25/2011 1438   AST 19 07/25/2011 1438   ALT 51 05/04/2014 1327   ALT 26 02/27/2012 1158   ALT 15 07/25/2011 1438   ALT 15 07/25/2011 1438   BILITOT 1.32* 05/04/2014 1327   BILITOT 1.10 02/27/2012 1158   BILITOT 1.0 07/25/2011 1438   BILITOT 1.0 07/25/2011 1438     ASSESSMENT & PLAN:  Mantle cell lymphoma of intra-abdominal lymph nodes Most recent CT scan showed complete response to treatment. He tolerated treatment very well with no side effects apart from minor bruising. I will see him every 3 months with history, physical examination and blood work. He will continue on chemotherapy indefinitely. I plan on repeat  imaging study in November 2015, 6 months away from the last CT scan.  Moderate dementia without behavioral disturbance He appears to be functioning well. He will continue his medication for this.  Thrombocytopenia, unspecified This is likely due to recent treatment. The patient denies recent history of bleeding such as epistaxis, hematuria or hematochezia. He is asymptomatic from the low platelet count. I will observe for now.  he does not require transfusion now. I will continue the chemotherapy at current dose without dosage adjustment.  If the thrombocytopenia gets progressive worse in the future, I might have to delay his treatment or adjust the chemotherapy dose.    Bruising This is due to side effects of chemotherapy. Overall, he has no signs of bleeding. Continue same dose of chemotherapy without dose adjustments. He can continue on aspirin for now.   All questions were answered. The patient knows to call the clinic with any problems, questions or concerns. No barriers to learning was detected. I spent 25 minutes counseling the patient face to face. The total time spent in the appointment was 30 minutes and more than 50% was on counseling and review of test results     Heath Lark, MD 05/04/2014 9:07 PM

## 2014-05-04 NOTE — Assessment & Plan Note (Signed)
He appears to be functioning well. He will continue his medication for this.  

## 2014-05-04 NOTE — Assessment & Plan Note (Signed)
This is due to side effects of chemotherapy. Overall, he has no signs of bleeding. Continue same dose of chemotherapy without dose adjustments. He can continue on aspirin for now.

## 2014-05-07 ENCOUNTER — Telehealth: Payer: Self-pay | Admitting: Hematology and Oncology

## 2014-05-07 NOTE — Telephone Encounter (Signed)
s/w pt re appts for 08/05/14

## 2014-06-09 ENCOUNTER — Telehealth: Payer: Self-pay | Admitting: *Deleted

## 2014-06-09 NOTE — Telephone Encounter (Signed)
Wife reports small, aprox 1/8 inch "tiny opening" behind pt's knee.  States was using warm compresses and now small amt of pus appears.  Denies any redness, swelling or discomfort around the opening.  Pt has appt to see Dermatologist next week.  Instructed wife to continue to use warm compresses and may apply some neosporin.   Call back for any increased redness, swelling, warmth or drainage.  Keep appt w/ Dermatologist as scheduled. She verbalized understanding.

## 2014-06-15 DIAGNOSIS — L678 Other hair color and hair shaft abnormalities: Secondary | ICD-10-CM | POA: Diagnosis not present

## 2014-06-15 DIAGNOSIS — L738 Other specified follicular disorders: Secondary | ICD-10-CM | POA: Diagnosis not present

## 2014-06-15 DIAGNOSIS — T6391XA Toxic effect of contact with unspecified venomous animal, accidental (unintentional), initial encounter: Secondary | ICD-10-CM | POA: Diagnosis not present

## 2014-06-15 DIAGNOSIS — D485 Neoplasm of uncertain behavior of skin: Secondary | ICD-10-CM | POA: Diagnosis not present

## 2014-06-18 ENCOUNTER — Telehealth: Payer: Self-pay | Admitting: *Deleted

## 2014-06-18 NOTE — Telephone Encounter (Signed)
Wife says she thinks Dr. Alvy Bimler said pt will have labs monthly but only scheduled for 3 months from last lab/visit.

## 2014-06-19 NOTE — Telephone Encounter (Signed)
Since his CBC has been stable, we can wait until his labs same day visit in 3 months

## 2014-06-21 ENCOUNTER — Telehealth: Payer: Self-pay | Admitting: *Deleted

## 2014-06-21 NOTE — Telephone Encounter (Signed)
Spoke with wife. Dr Alvy Bimler  States since CBC has been stable, can draw labs at 3 month appointment

## 2014-07-12 ENCOUNTER — Telehealth: Payer: Self-pay | Admitting: Hematology and Oncology

## 2014-07-12 NOTE — Telephone Encounter (Signed)
lvm for pt regarding to SEpt appt change due to MD out of the office....mailed pt appt sched and letter

## 2014-08-03 ENCOUNTER — Encounter: Payer: Self-pay | Admitting: *Deleted

## 2014-08-03 NOTE — Progress Notes (Signed)
RECEIVED A FAX FROM [facility] CONCERNING A CONFIRMATION OF PRESCRIPTION SHIPMENT FOR [place] ON 07/30/14.

## 2014-08-05 ENCOUNTER — Other Ambulatory Visit: Payer: Medicare Other

## 2014-08-05 ENCOUNTER — Ambulatory Visit: Payer: Medicare Other | Admitting: Hematology and Oncology

## 2014-08-10 ENCOUNTER — Ambulatory Visit (HOSPITAL_BASED_OUTPATIENT_CLINIC_OR_DEPARTMENT_OTHER): Payer: Medicare Other | Admitting: Hematology and Oncology

## 2014-08-10 ENCOUNTER — Encounter: Payer: Self-pay | Admitting: Hematology and Oncology

## 2014-08-10 ENCOUNTER — Other Ambulatory Visit (HOSPITAL_BASED_OUTPATIENT_CLINIC_OR_DEPARTMENT_OTHER): Payer: Medicare Other

## 2014-08-10 ENCOUNTER — Telehealth: Payer: Self-pay | Admitting: Hematology and Oncology

## 2014-08-10 VITALS — BP 142/56 | HR 58 | Temp 97.8°F | Resp 18 | Ht 72.0 in | Wt 171.4 lb

## 2014-08-10 DIAGNOSIS — C8313 Mantle cell lymphoma, intra-abdominal lymph nodes: Secondary | ICD-10-CM

## 2014-08-10 DIAGNOSIS — T148XXA Other injury of unspecified body region, initial encounter: Secondary | ICD-10-CM | POA: Diagnosis not present

## 2014-08-10 DIAGNOSIS — Z23 Encounter for immunization: Secondary | ICD-10-CM

## 2014-08-10 DIAGNOSIS — D696 Thrombocytopenia, unspecified: Secondary | ICD-10-CM

## 2014-08-10 DIAGNOSIS — F03B0 Unspecified dementia, moderate, without behavioral disturbance, psychotic disturbance, mood disturbance, and anxiety: Secondary | ICD-10-CM

## 2014-08-10 DIAGNOSIS — F0390 Unspecified dementia without behavioral disturbance: Secondary | ICD-10-CM | POA: Diagnosis not present

## 2014-08-10 LAB — CBC WITH DIFFERENTIAL/PLATELET
BASO%: 0.6 % (ref 0.0–2.0)
BASOS ABS: 0 10*3/uL (ref 0.0–0.1)
EOS%: 0.9 % (ref 0.0–7.0)
Eosinophils Absolute: 0.1 10*3/uL (ref 0.0–0.5)
HEMATOCRIT: 50.6 % — AB (ref 38.4–49.9)
HEMOGLOBIN: 16.5 g/dL (ref 13.0–17.1)
LYMPH%: 21.4 % (ref 14.0–49.0)
MCH: 30.4 pg (ref 27.2–33.4)
MCHC: 32.6 g/dL (ref 32.0–36.0)
MCV: 93.3 fL (ref 79.3–98.0)
MONO#: 0.5 10*3/uL (ref 0.1–0.9)
MONO%: 7.5 % (ref 0.0–14.0)
NEUT#: 4.8 10*3/uL (ref 1.5–6.5)
NEUT%: 69.6 % (ref 39.0–75.0)
PLATELETS: 108 10*3/uL — AB (ref 140–400)
RBC: 5.43 10*6/uL (ref 4.20–5.82)
RDW: 15 % — ABNORMAL HIGH (ref 11.0–14.6)
WBC: 7 10*3/uL (ref 4.0–10.3)
lymph#: 1.5 10*3/uL (ref 0.9–3.3)

## 2014-08-10 LAB — COMPREHENSIVE METABOLIC PANEL (CC13)
ALT: 27 U/L (ref 0–55)
AST: 25 U/L (ref 5–34)
Albumin: 3.9 g/dL (ref 3.5–5.0)
Alkaline Phosphatase: 82 U/L (ref 40–150)
Anion Gap: 9 mEq/L (ref 3–11)
BILIRUBIN TOTAL: 1.63 mg/dL — AB (ref 0.20–1.20)
BUN: 16.8 mg/dL (ref 7.0–26.0)
CALCIUM: 9.2 mg/dL (ref 8.4–10.4)
CO2: 28 mEq/L (ref 22–29)
CREATININE: 1.2 mg/dL (ref 0.7–1.3)
Chloride: 107 mEq/L (ref 98–109)
Glucose: 90 mg/dl (ref 70–140)
Potassium: 4.5 mEq/L (ref 3.5–5.1)
Sodium: 143 mEq/L (ref 136–145)
Total Protein: 6.6 g/dL (ref 6.4–8.3)

## 2014-08-10 LAB — LACTATE DEHYDROGENASE (CC13): LDH: 216 U/L (ref 125–245)

## 2014-08-10 MED ORDER — INFLUENZA VAC SPLIT QUAD 0.5 ML IM SUSY
0.5000 mL | PREFILLED_SYRINGE | Freq: Once | INTRAMUSCULAR | Status: AC
  Administered 2014-08-10: 0.5 mL via INTRAMUSCULAR
  Filled 2014-08-10: qty 0.5

## 2014-08-10 NOTE — Assessment & Plan Note (Signed)
Most recent CT scan showed complete response to treatment. He tolerated treatment very well with no side effects apart from minor bruising. I will see him every 3 months with history, physical examination and blood work. He will continue on chemotherapy indefinitely. I plan on repeat imaging study in Decemeber 2015, slightly over 6 months away from the last CT scan.

## 2014-08-10 NOTE — Telephone Encounter (Signed)
gv adn pritned appt sched adn avs for pt fro Dec....gv pt barium

## 2014-08-10 NOTE — Assessment & Plan Note (Signed)
This is due to side effects of chemotherapy. Overall, he has no signs of bleeding. Continue same dose of chemotherapy without dose adjustments. He can continue on aspirin for now.

## 2014-08-10 NOTE — Assessment & Plan Note (Signed)
This is likely due to recent treatment. The patient denies recent history of bleeding such as epistaxis, hematuria or hematochezia. He is asymptomatic from the low platelet count. I will observe for now.  he does not require transfusion now. I will continue the chemotherapy at current dose without dosage adjustment.  If the thrombocytopenia gets progressive worse in the future, I might have to delay his treatment or adjust the chemotherapy dose.   

## 2014-08-10 NOTE — Assessment & Plan Note (Signed)
He appears to be functioning well. He will continue his medication for this.  

## 2014-08-10 NOTE — Progress Notes (Signed)
[place] OFFICE PROGRESS NOTE  Patient Care Team: Lajean Manes, MD as PCP - General (Internal Medicine)  SUMMARY OF ONCOLOGIC HISTORY: This patient was diagnosed with mantle cell lymphoma in February of 2010. He presented with increasing fatigue and abdominal fullness. CT scan showed splenomegaly, periportal lymphadenopathy, bilateral iliac adenopathy, and a dominant lymph node mass in the right pelvis adjacent to the right external iliac vessels measuring 4.8 x 2.3 cm. Additional right inguinal adenopathy 2.9 x 2.6 cm. Diagnosis established by right inguinal lymph node biopsy done 02/01/2009 which showed a CD5 positive large B-cell lymphoma CD20 positive CD5 and CD10 positive lambda light chain restriction. Bone marrow with atypical nodular lymphoid aggregates. Cytogenetic studies did show a population of cells with the 11;14 translocation consistent with mantle cell lymphoma. He was living in [place] at the time. He was treated with 4 cycles of Cytoxan vincristine prednisone and Rituxan followed by 4 cycles of Cytoxan Novantrone vincristine and prednisone through 07/12/2009. He achieved a complete response.  Most recent survey CT scan done 11/24/2013 showed a single area of slowly enlarging lymphadenopathy involving the right external iliac node compared with 8 08/26/2013 study. Spleen stable at upper limit of normal size. Based on these results, from February of 2015, he was started on Ibrutinib. On May 2015, repeat CT scan showed near complete response to treatment.  INTERVAL HISTORY: Please see below for problem oriented charting. He tolerates chemotherapy well without any side effects. Denies any diarrhea. He denies new lymphadenopathy.  REVIEW OF SYSTEMS:   Constitutional: Denies fevers, chills or abnormal weight loss Eyes: Denies blurriness of vision Ears, nose, mouth, throat, and face: Denies mucositis or sore throat Respiratory: Denies cough, dyspnea or  wheezes Cardiovascular: Denies palpitation, chest discomfort or lower extremity swelling Gastrointestinal:  Denies nausea, heartburn or change in bowel habits Skin: Denies abnormal skin rashes Lymphatics: Denies new lymphadenopathy  Neurological:Denies numbness, tingling or new weaknesses Behavioral/Psych: Mood is stable, no new changes  All other systems were reviewed with the patient and are negative.  I have reviewed the past medical history, past surgical history, social history and family history with the patient and they are unchanged from previous note.  ALLERGIES:  has No Known Allergies.  MEDICATIONS:  Current Outpatient Prescriptions  Medication Sig Dispense Refill  . aspirin 81 MG tablet Take 81 mg by mouth daily.      Marland Kitchen ibrutinib (IMBRUVICA) 140 MG capsul Take 3 capsules (420 mg total) by mouth daily.  90 capsule  3  . Memantine HCl (NAMENDA XR PO) Take 1 tablet by mouth daily. 28 mg      . Multiple Vitamins-Minerals (EYE-VITE PLUS LUTEIN) CAPS Take 1 capsule by mouth 2 (two) times daily.      Marland Kitchen omeprazole (PRILOSEC) 20 MG capsule Take 20 mg by mouth daily.      . ondansetron (ZOFRAN ODT) 4 MG disintegrating tablet Take 1 tablet (4 mg total) by mouth every 8 (eight) hours as needed for nausea or vomiting.  20 tablet  6   No current facility-administered medications for this visit.    PHYSICAL EXAMINATION: ECOG PERFORMANCE STATUS: 1 - Symptomatic but completely ambulatory  Filed Vitals:   08/10/14 1243  BP: 142/56  Pulse: 58  Temp: 97.8 F (36.6 C)  Resp: 18   Filed Weights   08/10/14 1243  Weight: 171 lb 6.4 oz (77.747 kg)    GENERAL:alert, no distress and comfortable SKIN: skin color, texture, turgor are normal, no rashes or  significant lesions. Notice skin bruising EYES: normal, Conjunctiva are pink and non-injected, sclera clear OROPHARYNX:no exudate, no erythema and lips, buccal mucosa, and tongue normal  NECK: supple, thyroid normal size, non-tender,  without nodularity LYMPH:  no palpable lymphadenopathy in the cervical, axillary or inguinal LUNGS: clear to auscultation and percussion with normal breathing effort HEART: regular rate & rhythm and no murmurs and no lower extremity edema ABDOMEN:abdomen soft, non-tender and normal bowel sounds Musculoskeletal:no cyanosis of digits and no clubbing  NEURO: alert & oriented x 3 with fluent speech, no focal motor/sensory deficits  LABORATORY DATA:  I have reviewed the data as listed    Component Value Date/Time   NA 143 08/10/2014 1231   NA 145 02/27/2012 1158   NA 141 07/25/2011 1438   NA 141 07/25/2011 1438   K 4.5 08/10/2014 1231   K 4.4 02/27/2012 1158   K 4.3 07/25/2011 1438   K 4.3 07/25/2011 1438   CL 103 02/24/2013 0923   CL 102 02/27/2012 1158   CL 105 07/25/2011 1438   CL 105 07/25/2011 1438   CO2 28 08/10/2014 1231   CO2 30 02/27/2012 1158   CO2 28 07/25/2011 1438   CO2 28 07/25/2011 1438   GLUCOSE 90 08/10/2014 1231   GLUCOSE 98 02/24/2013 0923   GLUCOSE 96 02/27/2012 1158   GLUCOSE 83 07/25/2011 1438   GLUCOSE 83 07/25/2011 1438   BUN 16.8 08/10/2014 1231   BUN 14 02/27/2012 1158   BUN 19 07/25/2011 1438   BUN 19 07/25/2011 1438   CREATININE 1.2 08/10/2014 1231   CREATININE 1.0 02/27/2012 1158   CREATININE 0.98 07/25/2011 1438   CREATININE 0.98 07/25/2011 1438   CALCIUM 9.2 08/10/2014 1231   CALCIUM 8.7 02/27/2012 1158   CALCIUM 8.6 07/25/2011 1438   CALCIUM 8.6 07/25/2011 1438   PROT 6.6 08/10/2014 1231   PROT 6.8 02/27/2012 1158   PROT 5.8* 07/25/2011 1438   PROT 5.8* 07/25/2011 1438   ALBUMIN 3.9 08/10/2014 1231   ALBUMIN 4.3 07/25/2011 1438   ALBUMIN 4.3 07/25/2011 1438   AST 25 08/10/2014 1231   AST 33 02/27/2012 1158   AST 19 07/25/2011 1438   AST 19 07/25/2011 1438   ALT 27 08/10/2014 1231   ALT 26 02/27/2012 1158   ALT 15 07/25/2011 1438   ALT 15 07/25/2011 1438   ALKPHOS 82 08/10/2014 1231   ALKPHOS 105* 02/27/2012 1158   ALKPHOS 88 07/25/2011 1438   ALKPHOS 88 07/25/2011 1438   BILITOT 1.63*  08/10/2014 1231   BILITOT 1.10 02/27/2012 1158   BILITOT 1.0 07/25/2011 1438   BILITOT 1.0 07/25/2011 1438    No results found for this basename: SPEP,  UPEP,   kappa and lambda light chains    Lab Results  Component Value Date   WBC 7.0 08/10/2014   NEUTROABS 4.8 08/10/2014   HGB 16.5 08/10/2014   HCT 50.6* 08/10/2014   MCV 93.3 08/10/2014   PLT 108* 08/10/2014      Chemistry      Component Value Date/Time   NA 143 08/10/2014 1231   NA 145 02/27/2012 1158   NA 141 07/25/2011 1438   NA 141 07/25/2011 1438   K 4.5 08/10/2014 1231   K 4.4 02/27/2012 1158   K 4.3 07/25/2011 1438   K 4.3 07/25/2011 1438   CL 103 02/24/2013 0923   CL 102 02/27/2012 1158   CL 105 07/25/2011 1438   CL 105 07/25/2011 1438   CO2 28 08/10/2014  1231   CO2 30 02/27/2012 1158   CO2 28 07/25/2011 1438   CO2 28 07/25/2011 1438   BUN 16.8 08/10/2014 1231   BUN 14 02/27/2012 1158   BUN 19 07/25/2011 1438   BUN 19 07/25/2011 1438   CREATININE 1.2 08/10/2014 1231   CREATININE 1.0 02/27/2012 1158   CREATININE 0.98 07/25/2011 1438   CREATININE 0.98 07/25/2011 1438      Component Value Date/Time   CALCIUM 9.2 08/10/2014 1231   CALCIUM 8.7 02/27/2012 1158   CALCIUM 8.6 07/25/2011 1438   CALCIUM 8.6 07/25/2011 1438   ALKPHOS 82 08/10/2014 1231   ALKPHOS 105* 02/27/2012 1158   ALKPHOS 88 07/25/2011 1438   ALKPHOS 88 07/25/2011 1438   AST 25 08/10/2014 1231   AST 33 02/27/2012 1158   AST 19 07/25/2011 1438   AST 19 07/25/2011 1438   ALT 27 08/10/2014 1231   ALT 26 02/27/2012 1158   ALT 15 07/25/2011 1438   ALT 15 07/25/2011 1438   BILITOT 1.63* 08/10/2014 1231   BILITOT 1.10 02/27/2012 1158   BILITOT 1.0 07/25/2011 1438   BILITOT 1.0 07/25/2011 1438     ASSESSMENT & PLAN:  Mantle cell lymphoma of intra-abdominal lymph nodes Most recent CT scan showed complete response to treatment. He tolerated treatment very well with no side effects apart from minor bruising. I will see him every 3 months with history, physical examination and blood work. He will  continue on chemotherapy indefinitely. I plan on repeat imaging study in Decemeber 2015, slightly over 6 months away from the last CT scan.    Moderate dementia without behavioral disturbance He appears to be functioning well. He will continue his medication for this.    Thrombocytopenia, unspecified This is likely due to recent treatment. The patient denies recent history of bleeding such as epistaxis, hematuria or hematochezia. He is asymptomatic from the low platelet count. I will observe for now.  he does not require transfusion now. I will continue the chemotherapy at current dose without dosage adjustment.  If the thrombocytopenia gets progressive worse in the future, I might have to delay his treatment or adjust the chemotherapy dose.      Bruising This is due to side effects of chemotherapy. Overall, he has no signs of bleeding. Continue same dose of chemotherapy without dose adjustments. He can continue on aspirin for now.    We discussed the importance of preventive care and reviewed the vaccination programs. He does not have any prior allergic reactions to influenza vaccination. He agrees to proceed with influenza vaccination today and we will administer it today at the clinic.   Orders Placed This Encounter  Procedures  . CT Chest W Contrast    Standing Status: Future     Number of Occurrences:      Standing Expiration Date: 10/10/2015    Order Specific Question:  Reason for Exam (SYMPTOM  OR DIAGNOSIS REQUIRED)    Answer:  mantle cell lymphoma, assess response to Rx    Order Specific Question:  Preferred imaging location?    Answer:  [facility]  . CT Abdomen Pelvis W Contrast    Standing Status: Future     Number of Occurrences:      Standing Expiration Date: 11/10/2015    Order Specific Question:  Reason for Exam (SYMPTOM  OR DIAGNOSIS REQUIRED)    Answer:  mantle cell lymphoma, assess response to Rx    Order Specific Question:  Preferred imaging  location?  Answer:  [facility]   All questions were answered. The patient knows to call the clinic with any problems, questions or concerns. No barriers to learning was detected. I spent 30 minutes counseling the patient face to face. The total time spent in the appointment was 40 minutes and more than 50% was on counseling and review of test results     [facility], [place], MD 08/10/2014 4:12 PM

## 2014-08-23 ENCOUNTER — Telehealth: Payer: Self-pay | Admitting: *Deleted

## 2014-08-23 ENCOUNTER — Other Ambulatory Visit: Payer: Self-pay | Admitting: Hematology and Oncology

## 2014-08-23 ENCOUNTER — Encounter: Payer: Self-pay | Admitting: Hematology and Oncology

## 2014-08-23 DIAGNOSIS — R319 Hematuria, unspecified: Secondary | ICD-10-CM

## 2014-08-23 HISTORY — DX: Hematuria, unspecified: R31.9

## 2014-08-23 NOTE — Telephone Encounter (Signed)
Wife left message stating patient has had "pink/brown urine"- noticed last Thursday. Spoke with patient, he states this has been going on for a couple of months and wife now noticed it in his underwear.Marland Kitchen

## 2014-08-24 ENCOUNTER — Telehealth: Payer: Self-pay | Admitting: *Deleted

## 2014-08-24 NOTE — Telephone Encounter (Signed)
Informed wife of lab tomorrow at 10;30 am for U/A and CBC.  She verbalized understanding.

## 2014-08-25 ENCOUNTER — Other Ambulatory Visit (HOSPITAL_BASED_OUTPATIENT_CLINIC_OR_DEPARTMENT_OTHER): Payer: Medicare Other

## 2014-08-25 ENCOUNTER — Telehealth: Payer: Self-pay | Admitting: *Deleted

## 2014-08-25 DIAGNOSIS — C8313 Mantle cell lymphoma, intra-abdominal lymph nodes: Secondary | ICD-10-CM | POA: Diagnosis not present

## 2014-08-25 DIAGNOSIS — R319 Hematuria, unspecified: Secondary | ICD-10-CM

## 2014-08-25 DIAGNOSIS — R3919 Other difficulties with micturition: Secondary | ICD-10-CM

## 2014-08-25 LAB — CBC WITH DIFFERENTIAL/PLATELET
BASO%: 0.9 % (ref 0.0–2.0)
Basophils Absolute: 0.1 10*3/uL (ref 0.0–0.1)
EOS%: 0.9 % (ref 0.0–7.0)
Eosinophils Absolute: 0.1 10*3/uL (ref 0.0–0.5)
HCT: 49 % (ref 38.4–49.9)
HEMOGLOBIN: 15.9 g/dL (ref 13.0–17.1)
LYMPH#: 1.6 10*3/uL (ref 0.9–3.3)
LYMPH%: 24.1 % (ref 14.0–49.0)
MCH: 30.5 pg (ref 27.2–33.4)
MCHC: 32.6 g/dL (ref 32.0–36.0)
MCV: 93.8 fL (ref 79.3–98.0)
MONO#: 0.5 10*3/uL (ref 0.1–0.9)
MONO%: 7.9 % (ref 0.0–14.0)
NEUT#: 4.3 10*3/uL (ref 1.5–6.5)
NEUT%: 66.2 % (ref 39.0–75.0)
Platelets: 89 10*3/uL — ABNORMAL LOW (ref 140–400)
RBC: 5.22 10*6/uL (ref 4.20–5.82)
RDW: 14.5 % (ref 11.0–14.6)
WBC: 6.5 10*3/uL (ref 4.0–10.3)

## 2014-08-25 LAB — URINALYSIS, MICROSCOPIC - CHCC
Bilirubin (Urine): NEGATIVE
GLUCOSE UR CHCC: NEGATIVE mg/dL
Ketones: NEGATIVE mg/dL
Nitrite: NEGATIVE
Protein: NEGATIVE mg/dL
Specific Gravity, Urine: 1.025 (ref 1.003–1.035)
UROBILINOGEN UR: 0.2 mg/dL (ref 0.2–1)
pH: 7 (ref 4.6–8.0)

## 2014-08-25 NOTE — Telephone Encounter (Signed)
Instructed wife on Dr. Calton Dach message below.  Asked her to call back if urine still pink/brown in one week or sooner if he gets worse.  She verbalized understanding.

## 2014-08-25 NOTE — Telephone Encounter (Signed)
UA is normal. I suspect they abnormal looking urine was due to low platelet. Please encourage him to drink plenty of liquid. If still persistent, we can refer him to urology

## 2014-08-25 NOTE — Telephone Encounter (Signed)
Spoke w/ pt and wife in lobby this morning.  Pt here for u/a and CBC due to having some pink/brown urine at times since last Thursday. Pt denies any pain or burning w/ urination.  He denies any fevers.  He feels he is drinking plenty of fluids.  Informed wife will call her w/ results and any instructions after Dr. Alvy Bimler has a chance to review.

## 2014-08-26 LAB — URINE CULTURE

## 2014-08-27 ENCOUNTER — Telehealth: Payer: Self-pay | Admitting: *Deleted

## 2014-08-27 NOTE — Telephone Encounter (Signed)
Message copied by Cathlean Cower on Fri Aug 27, 2014  9:21 AM ------      Message from: [facility], [place]      Created: Fri Aug 27, 2014  7:40 AM      Regarding: urine culture       Pls let his wife know it's neg      ----- Message -----         From: [facility]         Sent: 08/25/2014  10:47 AM           To: Heath Lark, MD                   ------

## 2014-08-27 NOTE — Telephone Encounter (Signed)
Informed pt of urine culture is negative.   No evidence of infection.  He states no further blood seen since he has increased his fluid intake.  He says his wife is not home at this time.  Asked him to let her know and she can call us back if needed.  Asked him to notify us if he sees blood again in his urine.  He verbalized understanding.

## 2014-08-30 ENCOUNTER — Telehealth: Payer: Self-pay | Admitting: *Deleted

## 2014-08-30 ENCOUNTER — Other Ambulatory Visit: Payer: Self-pay | Admitting: *Deleted

## 2014-08-30 DIAGNOSIS — C8313 Mantle cell lymphoma, intra-abdominal lymph nodes: Secondary | ICD-10-CM

## 2014-08-30 MED ORDER — IBRUTINIB 140 MG PO CAPS: 420.0000 mg | ORAL_CAPSULE | Freq: Every day | ORAL | Status: DC

## 2014-08-30 NOTE — Telephone Encounter (Signed)
Wife called early this morning,  States [facility] has not received refill from [place] yet on pt's Imbruvica.  Called [facility] and they have been faxing request to a number this RN does not recognize.  Gave them the fax number for our Triage Dept and asked them to fax [place] refill request for MD signature. Refill signed by Dr. Alvy Bimler and faxed back to [facility].  Wife notified.

## 2014-08-30 NOTE — Telephone Encounter (Signed)
THIS REFILL REQUEST FOR IMBRUVICA WAS PLACED ON DR.GORSUCH'S DESK.

## 2014-08-30 NOTE — Telephone Encounter (Signed)
RECEIVED A FAX FROM [facility] CONCERNING A CONFIRMATION OF FACSIMILE RECEIPT FOR PT. REFERRAL. 

## 2014-09-01 NOTE — Telephone Encounter (Signed)
[facility] sent facsimile confirmation of Imbruvica prescription shipment.  Imbruvica was shipped on 08-31-2014 with next business day delivery.

## 2014-09-27 DIAGNOSIS — Z0000 Encounter for general adult medical examination without abnormal findings: Secondary | ICD-10-CM | POA: Diagnosis not present

## 2014-09-27 DIAGNOSIS — C8590 Non-Hodgkin lymphoma, unspecified, unspecified site: Secondary | ICD-10-CM | POA: Diagnosis not present

## 2014-09-27 DIAGNOSIS — Z23 Encounter for immunization: Secondary | ICD-10-CM | POA: Diagnosis not present

## 2014-09-27 DIAGNOSIS — K219 Gastro-esophageal reflux disease without esophagitis: Secondary | ICD-10-CM | POA: Diagnosis not present

## 2014-09-27 DIAGNOSIS — G301 Alzheimer's disease with late onset: Secondary | ICD-10-CM | POA: Diagnosis not present

## 2014-09-27 DIAGNOSIS — Z1389 Encounter for screening for other disorder: Secondary | ICD-10-CM | POA: Diagnosis not present

## 2014-10-25 DIAGNOSIS — H9201 Otalgia, right ear: Secondary | ICD-10-CM | POA: Diagnosis not present

## 2014-11-01 ENCOUNTER — Encounter (HOSPITAL_COMMUNITY): Payer: Self-pay

## 2014-11-01 ENCOUNTER — Encounter: Payer: Self-pay | Admitting: *Deleted

## 2014-11-01 ENCOUNTER — Other Ambulatory Visit (HOSPITAL_BASED_OUTPATIENT_CLINIC_OR_DEPARTMENT_OTHER): Payer: Medicare Other

## 2014-11-01 ENCOUNTER — Ambulatory Visit (HOSPITAL_COMMUNITY)
Admission: RE | Admit: 2014-11-01 | Discharge: 2014-11-01 | Disposition: A | Discharge status: Home / Self Care | Payer: Medicare Other | Source: Ambulatory Visit | Attending: Hematology and Oncology | Admitting: Hematology and Oncology

## 2014-11-01 DIAGNOSIS — I517 Cardiomegaly: Secondary | ICD-10-CM | POA: Diagnosis not present

## 2014-11-01 DIAGNOSIS — Z9221 Personal history of antineoplastic chemotherapy: Secondary | ICD-10-CM | POA: Diagnosis not present

## 2014-11-01 DIAGNOSIS — R590 Localized enlarged lymph nodes: Secondary | ICD-10-CM | POA: Insufficient documentation

## 2014-11-01 DIAGNOSIS — N400 Enlarged prostate without lower urinary tract symptoms: Secondary | ICD-10-CM | POA: Diagnosis not present

## 2014-11-01 DIAGNOSIS — K4090 Unilateral inguinal hernia, without obstruction or gangrene, not specified as recurrent: Secondary | ICD-10-CM | POA: Diagnosis not present

## 2014-11-01 DIAGNOSIS — Z23 Encounter for immunization: Secondary | ICD-10-CM

## 2014-11-01 DIAGNOSIS — Z923 Personal history of irradiation: Secondary | ICD-10-CM | POA: Insufficient documentation

## 2014-11-01 DIAGNOSIS — C8313 Mantle cell lymphoma, intra-abdominal lymph nodes: Secondary | ICD-10-CM | POA: Diagnosis not present

## 2014-11-01 LAB — CBC WITH DIFFERENTIAL/PLATELET
BASO%: 0.7 % (ref 0.0–2.0)
BASOS ABS: 0 10*3/uL (ref 0.0–0.1)
EOS%: 1 % (ref 0.0–7.0)
Eosinophils Absolute: 0.1 10*3/uL (ref 0.0–0.5)
HCT: 51 % — ABNORMAL HIGH (ref 38.4–49.9)
HEMOGLOBIN: 16.5 g/dL (ref 13.0–17.1)
LYMPH#: 1.8 10*3/uL (ref 0.9–3.3)
LYMPH%: 27.7 % (ref 14.0–49.0)
MCH: 30.4 pg (ref 27.2–33.4)
MCHC: 32.4 g/dL (ref 32.0–36.0)
MCV: 93.8 fL (ref 79.3–98.0)
MONO#: 0.5 10*3/uL (ref 0.1–0.9)
MONO%: 8.3 % (ref 0.0–14.0)
NEUT#: 4 10*3/uL (ref 1.5–6.5)
NEUT%: 62.3 % (ref 39.0–75.0)
Platelets: 102 10*3/uL — ABNORMAL LOW (ref 140–400)
RBC: 5.44 10*6/uL (ref 4.20–5.82)
RDW: 14 % (ref 11.0–14.6)
WBC: 6.5 10*3/uL (ref 4.0–10.3)

## 2014-11-01 LAB — COMPREHENSIVE METABOLIC PANEL (CC13)
ALT: 23 U/L (ref 0–55)
AST: 23 U/L (ref 5–34)
Albumin: 4 g/dL (ref 3.5–5.0)
Alkaline Phosphatase: 91 U/L (ref 40–150)
Anion Gap: 9 mEq/L (ref 3–11)
BUN: 17.7 mg/dL (ref 7.0–26.0)
CALCIUM: 9.4 mg/dL (ref 8.4–10.4)
CHLORIDE: 104 meq/L (ref 98–109)
CO2: 29 mEq/L (ref 22–29)
Creatinine: 1.1 mg/dL (ref 0.7–1.3)
EGFR: 59 mL/min/{1.73_m2} — AB (ref >90)
GLUCOSE: 78 mg/dL (ref 70–140)
Potassium: 4.3 mEq/L (ref 3.5–5.1)
SODIUM: 142 meq/L (ref 136–145)
Total Bilirubin: 1.53 mg/dL — ABNORMAL HIGH (ref 0.20–1.20)
Total Protein: 6.6 g/dL (ref 6.4–8.3)

## 2014-11-01 LAB — LACTATE DEHYDROGENASE (CC13): LDH: 184 U/L (ref 125–245)

## 2014-11-01 MED ORDER — IOHEXOL 300 MG/ML  SOLN
100.0000 mL | Freq: Once | INTRAMUSCULAR | Status: AC | PRN
  Administered 2014-11-01: 100 mL via INTRAVENOUS

## 2014-11-01 NOTE — Progress Notes (Signed)
RECEIVED A FAX FROM [facility] CONCERNING A CONFIRMATION OF PRESCRIPTION SHIPMENT FOR [place] ON 10/29/14.

## 2014-11-04 ENCOUNTER — Ambulatory Visit (HOSPITAL_BASED_OUTPATIENT_CLINIC_OR_DEPARTMENT_OTHER): Payer: Medicare Other | Admitting: Hematology and Oncology

## 2014-11-04 ENCOUNTER — Encounter: Payer: Self-pay | Admitting: Hematology and Oncology

## 2014-11-04 ENCOUNTER — Telehealth: Payer: Self-pay | Admitting: Hematology and Oncology

## 2014-11-04 VITALS — BP 136/61 | HR 56 | Temp 97.3°F | Resp 18 | Ht 72.0 in | Wt 172.4 lb

## 2014-11-04 DIAGNOSIS — C8313 Mantle cell lymphoma, intra-abdominal lymph nodes: Secondary | ICD-10-CM | POA: Diagnosis not present

## 2014-11-04 DIAGNOSIS — F0390 Unspecified dementia without behavioral disturbance: Secondary | ICD-10-CM | POA: Diagnosis not present

## 2014-11-04 DIAGNOSIS — T50905A Adverse effect of unspecified drugs, medicaments and biological substances, initial encounter: Secondary | ICD-10-CM

## 2014-11-04 DIAGNOSIS — D6959 Other secondary thrombocytopenia: Secondary | ICD-10-CM

## 2014-11-04 DIAGNOSIS — F03B0 Unspecified dementia, moderate, without behavioral disturbance, psychotic disturbance, mood disturbance, and anxiety: Secondary | ICD-10-CM

## 2014-11-04 NOTE — Assessment & Plan Note (Signed)
He appears to be functioning well. He will continue his medication for this.  

## 2014-11-04 NOTE — Assessment & Plan Note (Signed)
Most recent CT scan showed complete response to treatment. He tolerated treatment very well with no side effects apart from minor bruising. I will see him every 3 months with history, physical examination and blood work. He will continue on chemotherapy indefinitely. I plan on repeat imaging study in June 2016, slightly over 6 months away from the last CT scan.

## 2014-11-04 NOTE — Assessment & Plan Note (Signed)
This is likely due to recent treatment. The patient denies recent history of bleeding such as epistaxis, hematuria or hematochezia. He is asymptomatic from the low platelet count. I will observe for now.  he does not require transfusion now. I will continue the chemotherapy at current dose without dosage adjustment.  If the thrombocytopenia gets progressive worse in the future, I might have to delay his treatment or adjust the chemotherapy dose.   

## 2014-11-04 NOTE — Progress Notes (Signed)
[place] OFFICE PROGRESS NOTE  Patient Care Team: Lajean Manes, MD as PCP - General (Internal Medicine)  SUMMARY OF ONCOLOGIC HISTORY:  This patient was diagnosed with mantle cell lymphoma in February of 2010. He presented with increasing fatigue and abdominal fullness. CT scan showed splenomegaly, periportal lymphadenopathy, bilateral iliac adenopathy, and a dominant lymph node mass in the right pelvis adjacent to the right external iliac vessels measuring 4.8 x 2.3 cm. Additional right inguinal adenopathy 2.9 x 2.6 cm. Diagnosis established by right inguinal lymph node biopsy done 02/01/2009 which showed a CD5 positive large B-cell lymphoma CD20 positive CD5 and CD10 positive lambda light chain restriction. Bone marrow with atypical nodular lymphoid aggregates. Cytogenetic studies did show a population of cells with the 11;14 translocation consistent with mantle cell lymphoma. He was living in [place] at the time. He was treated with 4 cycles of Cytoxan vincristine prednisone and Rituxan followed by 4 cycles of Cytoxan Novantrone vincristine and prednisone through 07/12/2009. He achieved a complete response.  Most recent survey CT scan done 11/24/2013 showed a single area of slowly enlarging lymphadenopathy involving the right external iliac node compared with 08/26/2013 study. Spleen stable at upper limit of normal size. Based on these results, from February of 2015, he was started on Ibrutinib. On May 2015, repeat CT scan showed near complete response to treatment. In December 2015, repeat CT scan show complete response to treatment INTERVAL HISTORY: Please see below for problem oriented charting. He tolerates chemotherapy well without any side effects. Denies any diarrhea. He denies new lymphadenopathy. He has intermittent brown discoloration in his urine, improved with hydration. Denies hematuria.  REVIEW OF SYSTEMS:   Constitutional: Denies fevers, chills or  abnormal weight loss Eyes: Denies blurriness of vision Ears, nose, mouth, throat, and face: Denies mucositis or sore throat Respiratory: Denies cough, dyspnea or wheezes Cardiovascular: Denies palpitation, chest discomfort or lower extremity swelling Gastrointestinal:  Denies nausea, heartburn or change in bowel habits Skin: Denies abnormal skin rashes Lymphatics: Denies new lymphadenopathy or easy bruising Neurological:Denies numbness, tingling or new weaknesses Behavioral/Psych: Mood is stable, no new changes  All other systems were reviewed with the patient and are negative.  I have reviewed the past medical history, past surgical history, social history and family history with the patient and they are unchanged from previous note.  ALLERGIES:  has No Known Allergies.  MEDICATIONS:  Current Outpatient Prescriptions  Medication Sig Dispense Refill  . aspirin 81 MG tablet Take 81 mg by mouth daily.    Marland Kitchen ibrutinib (IMBRUVICA) 140 MG capsul Take 3 capsules (420 mg total) by mouth daily. 90 capsule 3  . Memantine HCl (NAMENDA XR PO) Take 1 tablet by mouth daily. 28 mg    . Multiple Vitamins-Minerals (EYE-VITE PLUS LUTEIN) CAPS Take 1 capsule by mouth 2 (two) times daily.    Marland Kitchen omeprazole (PRILOSEC) 20 MG capsule Take 20 mg by mouth daily.    . ondansetron (ZOFRAN ODT) 4 MG disintegrating tablet Take 1 tablet (4 mg total) by mouth every 8 (eight) hours as needed for nausea or vomiting. 20 tablet 6   No current facility-administered medications for this visit.    PHYSICAL EXAMINATION: ECOG PERFORMANCE STATUS: 0 - Asymptomatic  Filed Vitals:   11/04/14 1241  BP: 136/61  Pulse: 56  Temp: 97.3 F (36.3 C)  Resp: 18   Filed Weights   11/04/14 1241  Weight: 172 lb 6.4 oz (78.2 kg)    GENERAL:alert, no distress and comfortable  SKIN: skin color, texture, turgor are normal, no rashes or significant lesions EYES: normal, Conjunctiva are pink and non-injected, sclera  clear OROPHARYNX:no exudate, no erythema and lips, buccal mucosa, and tongue normal  NECK: supple, thyroid normal size, non-tender, without nodularity LYMPH:  no palpable lymphadenopathy in the cervical, axillary or inguinal LUNGS: clear to auscultation and percussion with normal breathing effort HEART: regular rate & rhythm and no murmurs and no lower extremity edema ABDOMEN:abdomen soft, non-tender and normal bowel sounds Musculoskeletal:no cyanosis of digits and no clubbing  NEURO: alert & oriented x 3 with fluent speech, no focal motor/sensory deficits  LABORATORY DATA:  I have reviewed the data as listed    Component Value Date/Time   NA 142 11/01/2014 1244   NA 145 02/27/2012 1158   NA 141 07/25/2011 1438   NA 141 07/25/2011 1438   K 4.3 11/01/2014 1244   K 4.4 02/27/2012 1158   K 4.3 07/25/2011 1438   K 4.3 07/25/2011 1438   CL 103 02/24/2013 0923   CL 102 02/27/2012 1158   CL 105 07/25/2011 1438   CL 105 07/25/2011 1438   CO2 29 11/01/2014 1244   CO2 30 02/27/2012 1158   CO2 28 07/25/2011 1438   CO2 28 07/25/2011 1438   GLUCOSE 78 11/01/2014 1244   GLUCOSE 98 02/24/2013 0923   GLUCOSE 96 02/27/2012 1158   GLUCOSE 83 07/25/2011 1438   GLUCOSE 83 07/25/2011 1438   BUN 17.7 11/01/2014 1244   BUN 14 02/27/2012 1158   BUN 19 07/25/2011 1438   BUN 19 07/25/2011 1438   CREATININE 1.1 11/01/2014 1244   CREATININE 1.0 02/27/2012 1158   CREATININE 0.98 07/25/2011 1438   CREATININE 0.98 07/25/2011 1438   CALCIUM 9.4 11/01/2014 1244   CALCIUM 8.7 02/27/2012 1158   CALCIUM 8.6 07/25/2011 1438   CALCIUM 8.6 07/25/2011 1438   PROT 6.6 11/01/2014 1244   PROT 6.8 02/27/2012 1158   PROT 5.8* 07/25/2011 1438   PROT 5.8* 07/25/2011 1438   ALBUMIN 4.0 11/01/2014 1244   ALBUMIN 4.3 07/25/2011 1438   ALBUMIN 4.3 07/25/2011 1438   AST 23 11/01/2014 1244   AST 33 02/27/2012 1158   AST 19 07/25/2011 1438   AST 19 07/25/2011 1438   ALT 23 11/01/2014 1244   ALT 26 02/27/2012  1158   ALT 15 07/25/2011 1438   ALT 15 07/25/2011 1438   ALKPHOS 91 11/01/2014 1244   ALKPHOS 105* 02/27/2012 1158   ALKPHOS 88 07/25/2011 1438   ALKPHOS 88 07/25/2011 1438   BILITOT 1.53* 11/01/2014 1244   BILITOT 1.10 02/27/2012 1158   BILITOT 1.0 07/25/2011 1438   BILITOT 1.0 07/25/2011 1438    No results found for: SPEP, UPEP  Lab Results  Component Value Date   WBC 6.5 11/01/2014   NEUTROABS 4.0 11/01/2014   HGB 16.5 11/01/2014   HCT 51.0* 11/01/2014   MCV 93.8 11/01/2014   PLT 102* 11/01/2014      Chemistry      Component Value Date/Time   NA 142 11/01/2014 1244   NA 145 02/27/2012 1158   NA 141 07/25/2011 1438   NA 141 07/25/2011 1438   K 4.3 11/01/2014 1244   K 4.4 02/27/2012 1158   K 4.3 07/25/2011 1438   K 4.3 07/25/2011 1438   CL 103 02/24/2013 0923   CL 102 02/27/2012 1158   CL 105 07/25/2011 1438   CL 105 07/25/2011 1438   CO2 29 11/01/2014 1244   CO2 30  02/27/2012 1158   CO2 28 07/25/2011 1438   CO2 28 07/25/2011 1438   BUN 17.7 11/01/2014 1244   BUN 14 02/27/2012 1158   BUN 19 07/25/2011 1438   BUN 19 07/25/2011 1438   CREATININE 1.1 11/01/2014 1244   CREATININE 1.0 02/27/2012 1158   CREATININE 0.98 07/25/2011 1438   CREATININE 0.98 07/25/2011 1438      Component Value Date/Time   CALCIUM 9.4 11/01/2014 1244   CALCIUM 8.7 02/27/2012 1158   CALCIUM 8.6 07/25/2011 1438   CALCIUM 8.6 07/25/2011 1438   ALKPHOS 91 11/01/2014 1244   ALKPHOS 105* 02/27/2012 1158   ALKPHOS 88 07/25/2011 1438   ALKPHOS 88 07/25/2011 1438   AST 23 11/01/2014 1244   AST 33 02/27/2012 1158   AST 19 07/25/2011 1438   AST 19 07/25/2011 1438   ALT 23 11/01/2014 1244   ALT 26 02/27/2012 1158   ALT 15 07/25/2011 1438   ALT 15 07/25/2011 1438   BILITOT 1.53* 11/01/2014 1244   BILITOT 1.10 02/27/2012 1158   BILITOT 1.0 07/25/2011 1438   BILITOT 1.0 07/25/2011 1438       RADIOGRAPHIC STUDIES: Imaging study showed no evidence of recurrence of disease I have  personally reviewed the radiological images as listed and agreed with the findings in the report.  ASSESSMENT & PLAN:  Mantle cell lymphoma of intra-abdominal lymph nodes Most recent CT scan showed complete response to treatment. He tolerated treatment very well with no side effects apart from minor bruising. I will see him every 3 months with history, physical examination and blood work. He will continue on chemotherapy indefinitely. I plan on repeat imaging study in June 2016, slightly over 6 months away from the last CT scan.      Thrombocytopenia due to drugs This is likely due to recent treatment. The patient denies recent history of bleeding such as epistaxis, hematuria or hematochezia. He is asymptomatic from the low platelet count. I will observe for now.  he does not require transfusion now. I will continue the chemotherapy at current dose without dosage adjustment.  If the thrombocytopenia gets progressive worse in the future, I might have to delay his treatment or adjust the chemotherapy dose.    Moderate dementia without behavioral disturbance He appears to be functioning well. He will continue his medication for this.    No orders of the defined types were placed in this encounter.   All questions were answered. The patient knows to call the clinic with any problems, questions or concerns. No barriers to learning was detected. I spent 30 minutes counseling the patient face to face. The total time spent in the appointment was 40 minutes and more than 50% was on counseling and review of test results     [facility], [place], MD 11/04/2014 9:36 PM

## 2014-11-04 NOTE — Telephone Encounter (Signed)
Gave avs & cal for March 2016. °

## 2014-11-25 DIAGNOSIS — J329 Chronic sinusitis, unspecified: Secondary | ICD-10-CM | POA: Diagnosis not present

## 2014-12-08 ENCOUNTER — Telehealth: Payer: Self-pay | Admitting: *Deleted

## 2014-12-08 NOTE — Telephone Encounter (Signed)
VM from pt's wife states pt does not have any [employer] pay assistance for this month for imbruvica.  Forwarded message to Carmelina Noun in managed care dept and also left her a VM.

## 2014-12-27 ENCOUNTER — Other Ambulatory Visit: Payer: Self-pay

## 2014-12-27 DIAGNOSIS — C8313 Mantle cell lymphoma, intra-abdominal lymph nodes: Secondary | ICD-10-CM

## 2014-12-27 MED ORDER — IBRUTINIB 140 MG PO CAPS: 420.0000 mg | ORAL_CAPSULE | Freq: Every day | ORAL | Status: DC

## 2014-12-29 DIAGNOSIS — H26491 Other secondary cataract, right eye: Secondary | ICD-10-CM | POA: Diagnosis not present

## 2014-12-29 DIAGNOSIS — H3531 Nonexudative age-related macular degeneration: Secondary | ICD-10-CM | POA: Diagnosis not present

## 2014-12-30 ENCOUNTER — Other Ambulatory Visit: Payer: Self-pay | Admitting: *Deleted

## 2014-12-30 DIAGNOSIS — C8313 Mantle cell lymphoma, intra-abdominal lymph nodes: Secondary | ICD-10-CM

## 2014-12-30 MED ORDER — IBRUTINIB 140 MG PO CAPS: 420.0000 mg | ORAL_CAPSULE | Freq: Every day | ORAL | Status: DC

## 2014-12-30 NOTE — Telephone Encounter (Signed)
Jeffrey Simmons calls concerned because pt.'s Imbruvica refill has not been received by [facility].  Upon investigation, rx was sent to [facility] instead of [facility].  I will resend to correct pharmacy.  She will check with them in an hour and call me back if [facility] does not have script.

## 2014-12-31 ENCOUNTER — Encounter: Payer: Self-pay | Admitting: Hematology and Oncology

## 2014-12-31 NOTE — Progress Notes (Signed)
Pt is approved with [employer] for Imbruvica from 01/04/15 to 01/04/16 or when the benefit cap has been met.  The amount of the grant is $12,500.

## 2015-01-18 DIAGNOSIS — H26491 Other secondary cataract, right eye: Secondary | ICD-10-CM | POA: Diagnosis not present

## 2015-01-28 NOTE — Telephone Encounter (Signed)
Fax received from [facility] re: shipment on 01/27/15 of patients Imbruvica.

## 2015-02-03 ENCOUNTER — Encounter: Payer: Self-pay | Admitting: Hematology and Oncology

## 2015-02-03 ENCOUNTER — Other Ambulatory Visit (HOSPITAL_BASED_OUTPATIENT_CLINIC_OR_DEPARTMENT_OTHER): Payer: Medicare Other

## 2015-02-03 ENCOUNTER — Ambulatory Visit (HOSPITAL_BASED_OUTPATIENT_CLINIC_OR_DEPARTMENT_OTHER): Payer: Medicare Other | Admitting: Hematology and Oncology

## 2015-02-03 VITALS — BP 148/71 | HR 55 | Temp 97.5°F | Resp 18 | Ht 72.0 in | Wt 172.7 lb

## 2015-02-03 DIAGNOSIS — T50905A Adverse effect of unspecified drugs, medicaments and biological substances, initial encounter: Secondary | ICD-10-CM

## 2015-02-03 DIAGNOSIS — C8313 Mantle cell lymphoma, intra-abdominal lymph nodes: Secondary | ICD-10-CM

## 2015-02-03 DIAGNOSIS — L820 Inflamed seborrheic keratosis: Secondary | ICD-10-CM | POA: Diagnosis not present

## 2015-02-03 DIAGNOSIS — D6959 Other secondary thrombocytopenia: Secondary | ICD-10-CM

## 2015-02-03 LAB — CBC WITH DIFFERENTIAL/PLATELET
BASO%: 0.5 % (ref 0.0–2.0)
BASOS ABS: 0 10*3/uL (ref 0.0–0.1)
EOS%: 1.5 % (ref 0.0–7.0)
Eosinophils Absolute: 0.1 10*3/uL (ref 0.0–0.5)
HCT: 47.8 % (ref 38.4–49.9)
HGB: 16.3 g/dL (ref 13.0–17.1)
LYMPH%: 29.9 % (ref 14.0–49.0)
MCH: 32 pg (ref 27.2–33.4)
MCHC: 34.1 g/dL (ref 32.0–36.0)
MCV: 93.7 fL (ref 79.3–98.0)
MONO#: 0.6 10*3/uL (ref 0.1–0.9)
MONO%: 9.1 % (ref 0.0–14.0)
NEUT%: 59 % (ref 39.0–75.0)
NEUTROS ABS: 3.6 10*3/uL (ref 1.5–6.5)
Platelets: 83 10*3/uL — ABNORMAL LOW (ref 140–400)
RBC: 5.1 10*6/uL (ref 4.20–5.82)
RDW: 13.9 % (ref 11.0–14.6)
WBC: 6.1 10*3/uL (ref 4.0–10.3)
lymph#: 1.8 10*3/uL (ref 0.9–3.3)

## 2015-02-03 LAB — COMPREHENSIVE METABOLIC PANEL (CC13)
ALBUMIN: 3.7 g/dL (ref 3.5–5.0)
ALT: 22 U/L (ref 0–55)
AST: 22 U/L (ref 5–34)
Alkaline Phosphatase: 77 U/L (ref 40–150)
Anion Gap: 8 mEq/L (ref 3–11)
BUN: 20 mg/dL (ref 7.0–26.0)
CALCIUM: 8.7 mg/dL (ref 8.4–10.4)
CHLORIDE: 107 meq/L (ref 98–109)
CO2: 28 mEq/L (ref 22–29)
CREATININE: 1.2 mg/dL (ref 0.7–1.3)
EGFR: 57 mL/min/{1.73_m2} — ABNORMAL LOW (ref >90)
Glucose: 74 mg/dl (ref 70–140)
Potassium: 4.5 mEq/L (ref 3.5–5.1)
Sodium: 143 mEq/L (ref 136–145)
TOTAL PROTEIN: 5.9 g/dL — AB (ref 6.4–8.3)
Total Bilirubin: 1.32 mg/dL — ABNORMAL HIGH (ref 0.20–1.20)

## 2015-02-03 LAB — LACTATE DEHYDROGENASE (CC13): LDH: 196 U/L (ref 125–245)

## 2015-02-03 NOTE — Assessment & Plan Note (Signed)
This is likely due to recent treatment. The patient denies recent history of bleeding such as epistaxis, hematuria or hematochezia. He is asymptomatic from the low platelet count. I will observe for now.  he does not require transfusion now. I will continue the chemotherapy at current dose without dosage adjustment.  If the thrombocytopenia gets progressive worse in the future, I might have to delay his treatment or adjust the chemotherapy dose.   

## 2015-02-03 NOTE — Assessment & Plan Note (Signed)
This does not bother him much. I recommend avoiding excessive sun exposure and to wear a hat when he goes out to walk his dog.

## 2015-02-03 NOTE — Assessment & Plan Note (Signed)
Most recent CT scan showed complete response to treatment. He tolerated treatment very well with no side effects apart from minor bruising. I will see him every 3 months with history, physical examination and blood work. He will continue on chemotherapy indefinitely. I plan on repeat imaging study at the end of the year.

## 2015-02-03 NOTE — Progress Notes (Signed)
[place] OFFICE PROGRESS NOTE  Patient Care Team: Lajean Manes, MD as PCP - General (Internal Medicine)  SUMMARY OF ONCOLOGIC HISTORY:  This patient was diagnosed with mantle cell lymphoma in February of 2010. He presented with increasing fatigue and abdominal fullness. CT scan showed splenomegaly, periportal lymphadenopathy, bilateral iliac adenopathy, and a dominant lymph node mass in the right pelvis adjacent to the right external iliac vessels measuring 4.8 x 2.3 cm. Additional right inguinal adenopathy 2.9 x 2.6 cm. Diagnosis established by right inguinal lymph node biopsy done 02/01/2009 which showed a CD5 positive large B-cell lymphoma CD20 positive CD5 and CD10 positive lambda light chain restriction. Bone marrow with atypical nodular lymphoid aggregates. Cytogenetic studies did show a population of cells with the 11;14 translocation consistent with mantle cell lymphoma. He was living in [place] at the time. He was treated with 4 cycles of Cytoxan vincristine prednisone and Rituxan followed by 4 cycles of Cytoxan Novantrone vincristine and prednisone through 07/12/2009. He achieved a complete response.  Most recent survey CT scan done 11/24/2013 showed a single area of slowly enlarging lymphadenopathy involving the right external iliac node compared with 08/26/2013 study. Spleen stable at upper limit of normal size. Based on these results, from February of 2015, he was started on Ibrutinib. On May 2015, repeat CT scan showed near complete response to treatment. In December 2015, repeat CT scan show complete response to treatment  INTERVAL HISTORY: Please see below for problem oriented charting. He feels well. Denies recent infection. He has some skin lesions on his scalp but does not bother him. Does not have any lymphadenopathy. Denies new recent change in appetite or weight loss.  REVIEW OF SYSTEMS:   Constitutional: Denies fevers, chills or abnormal  weight loss Eyes: Denies blurriness of vision Ears, nose, mouth, throat, and face: Denies mucositis or sore throat Respiratory: Denies cough, dyspnea or wheezes Cardiovascular: Denies palpitation, chest discomfort or lower extremity swelling Gastrointestinal:  Denies nausea, heartburn or change in bowel habits Lymphatics: Denies new lymphadenopathy or easy bruising Neurological:Denies numbness, tingling or new weaknesses Behavioral/Psych: Mood is stable, no new changes  All other systems were reviewed with the patient and are negative.  I have reviewed the past medical history, past surgical history, social history and family history with the patient and they are unchanged from previous note.  ALLERGIES:  has No Known Allergies.  MEDICATIONS:  Current Outpatient Prescriptions  Medication Sig Dispense Refill  . aspirin 81 MG tablet Take 81 mg by mouth daily.    Marland Kitchen ibrutinib (IMBRUVICA) 140 MG capsul Take 3 capsules (420 mg total) by mouth daily. 90 capsule 3  . Memantine HCl (NAMENDA XR PO) Take 1 tablet by mouth daily. 28 mg    . Multiple Vitamins-Minerals (EYE-VITE PLUS LUTEIN) CAPS Take 1 capsule by mouth 2 (two) times daily.    Marland Kitchen omeprazole (PRILOSEC) 20 MG capsule Take 20 mg by mouth daily.    . ondansetron (ZOFRAN ODT) 4 MG disintegrating tablet Take 1 tablet (4 mg total) by mouth every 8 (eight) hours as needed for nausea or vomiting. 20 tablet 6   No current facility-administered medications for this visit.    PHYSICAL EXAMINATION: ECOG PERFORMANCE STATUS: 0 - Asymptomatic  Filed Vitals:   02/03/15 1208  BP: 148/71  Pulse: 55  Temp: 97.5 F (36.4 C)  Resp: 18   Filed Weights   02/03/15 1208  Weight: 172 lb 11.2 oz (78.336 kg)    GENERAL:alert, no distress and  comfortable SKIN: skin color, texture, turgor are normal, no rashes or significant lesions. Noted seborrheic keratosis EYES: normal, Conjunctiva are pink and non-injected, sclera clear OROPHARYNX:no exudate,  no erythema and lips, buccal mucosa, and tongue normal  NECK: supple, thyroid normal size, non-tender, without nodularity LYMPH:  no palpable lymphadenopathy in the cervical, axillary or inguinal LUNGS: clear to auscultation and percussion with normal breathing effort HEART: regular rate & rhythm and no murmurs and no lower extremity edema ABDOMEN:abdomen soft, non-tender and normal bowel sounds Musculoskeletal:no cyanosis of digits and no clubbing  NEURO: alert & oriented x 3 with fluent speech, no focal motor/sensory deficits  LABORATORY DATA:  I have reviewed the data as listed    Component Value Date/Time   NA 142 11/01/2014 1244   NA 145 02/27/2012 1158   NA 141 07/25/2011 1438   NA 141 07/25/2011 1438   K 4.3 11/01/2014 1244   K 4.4 02/27/2012 1158   K 4.3 07/25/2011 1438   K 4.3 07/25/2011 1438   CL 103 02/24/2013 0923   CL 102 02/27/2012 1158   CL 105 07/25/2011 1438   CL 105 07/25/2011 1438   CO2 29 11/01/2014 1244   CO2 30 02/27/2012 1158   CO2 28 07/25/2011 1438   CO2 28 07/25/2011 1438   GLUCOSE 78 11/01/2014 1244   GLUCOSE 98 02/24/2013 0923   GLUCOSE 96 02/27/2012 1158   GLUCOSE 83 07/25/2011 1438   GLUCOSE 83 07/25/2011 1438   BUN 17.7 11/01/2014 1244   BUN 14 02/27/2012 1158   BUN 19 07/25/2011 1438   BUN 19 07/25/2011 1438   CREATININE 1.1 11/01/2014 1244   CREATININE 1.0 02/27/2012 1158   CREATININE 0.98 07/25/2011 1438   CREATININE 0.98 07/25/2011 1438   CALCIUM 9.4 11/01/2014 1244   CALCIUM 8.7 02/27/2012 1158   CALCIUM 8.6 07/25/2011 1438   CALCIUM 8.6 07/25/2011 1438   PROT 6.6 11/01/2014 1244   PROT 6.8 02/27/2012 1158   PROT 5.8* 07/25/2011 1438   PROT 5.8* 07/25/2011 1438   ALBUMIN 4.0 11/01/2014 1244   ALBUMIN 4.3 07/25/2011 1438   ALBUMIN 4.3 07/25/2011 1438   AST 23 11/01/2014 1244   AST 33 02/27/2012 1158   AST 19 07/25/2011 1438   AST 19 07/25/2011 1438   ALT 23 11/01/2014 1244   ALT 26 02/27/2012 1158   ALT 15 07/25/2011 1438    ALT 15 07/25/2011 1438   ALKPHOS 91 11/01/2014 1244   ALKPHOS 105* 02/27/2012 1158   ALKPHOS 88 07/25/2011 1438   ALKPHOS 88 07/25/2011 1438   BILITOT 1.53* 11/01/2014 1244   BILITOT 1.10 02/27/2012 1158   BILITOT 1.0 07/25/2011 1438   BILITOT 1.0 07/25/2011 1438    No results found for: SPEP, UPEP  Lab Results  Component Value Date   WBC 6.1 02/03/2015   NEUTROABS 3.6 02/03/2015   HGB 16.3 02/03/2015   HCT 47.8 02/03/2015   MCV 93.7 02/03/2015   PLT 83* 02/03/2015      Chemistry      Component Value Date/Time   NA 142 11/01/2014 1244   NA 145 02/27/2012 1158   NA 141 07/25/2011 1438   NA 141 07/25/2011 1438   K 4.3 11/01/2014 1244   K 4.4 02/27/2012 1158   K 4.3 07/25/2011 1438   K 4.3 07/25/2011 1438   CL 103 02/24/2013 0923   CL 102 02/27/2012 1158   CL 105 07/25/2011 1438   CL 105 07/25/2011 1438   CO2 29 11/01/2014 1244  CO2 30 02/27/2012 1158   CO2 28 07/25/2011 1438   CO2 28 07/25/2011 1438   BUN 17.7 11/01/2014 1244   BUN 14 02/27/2012 1158   BUN 19 07/25/2011 1438   BUN 19 07/25/2011 1438   CREATININE 1.1 11/01/2014 1244   CREATININE 1.0 02/27/2012 1158   CREATININE 0.98 07/25/2011 1438   CREATININE 0.98 07/25/2011 1438      Component Value Date/Time   CALCIUM 9.4 11/01/2014 1244   CALCIUM 8.7 02/27/2012 1158   CALCIUM 8.6 07/25/2011 1438   CALCIUM 8.6 07/25/2011 1438   ALKPHOS 91 11/01/2014 1244   ALKPHOS 105* 02/27/2012 1158   ALKPHOS 88 07/25/2011 1438   ALKPHOS 88 07/25/2011 1438   AST 23 11/01/2014 1244   AST 33 02/27/2012 1158   AST 19 07/25/2011 1438   AST 19 07/25/2011 1438   ALT 23 11/01/2014 1244   ALT 26 02/27/2012 1158   ALT 15 07/25/2011 1438   ALT 15 07/25/2011 1438   BILITOT 1.53* 11/01/2014 1244   BILITOT 1.10 02/27/2012 1158   BILITOT 1.0 07/25/2011 1438   BILITOT 1.0 07/25/2011 1438      ASSESSMENT & PLAN:  Mantle cell lymphoma of intra-abdominal lymph nodes Most recent CT scan showed complete response to  treatment. He tolerated treatment very well with no side effects apart from minor bruising. I will see him every 3 months with history, physical examination and blood work. He will continue on chemotherapy indefinitely. I plan on repeat imaging study at the end of the year.    Thrombocytopenia due to drugs This is likely due to recent treatment. The patient denies recent history of bleeding such as epistaxis, hematuria or hematochezia. He is asymptomatic from the low platelet count. I will observe for now.  he does not require transfusion now. I will continue the chemotherapy at current dose without dosage adjustment.  If the thrombocytopenia gets progressive worse in the future, I might have to delay his treatment or adjust the chemotherapy dose.     Seborrheic keratoses, inflamed This does not bother him much. I recommend avoiding excessive sun exposure and to wear a hat when he goes out to walk his dog.    No orders of the defined types were placed in this encounter.   All questions were answered. The patient knows to call the clinic with any problems, questions or concerns. No barriers to learning was detected. I spent 15 minutes counseling the patient face to face. The total time spent in the appointment was 20 minutes and more than 50% was on counseling and review of test results     [facility], Folashade Gamboa, MD 02/03/2015 12:33 PM

## 2015-02-04 ENCOUNTER — Telehealth: Payer: Self-pay | Admitting: Hematology and Oncology

## 2015-02-04 NOTE — Telephone Encounter (Signed)
Confirm appointment for July. mailed calendar.

## 2015-03-16 ENCOUNTER — Ambulatory Visit
Admission: RE | Admit: 2015-03-16 | Discharge: 2015-03-16 | Disposition: A | Discharge status: Home / Self Care | Payer: Medicare Other | Source: Ambulatory Visit | Attending: Nurse Practitioner | Admitting: Nurse Practitioner

## 2015-03-16 ENCOUNTER — Other Ambulatory Visit: Payer: Self-pay | Admitting: Nurse Practitioner

## 2015-03-16 DIAGNOSIS — M25562 Pain in left knee: Secondary | ICD-10-CM

## 2015-03-16 DIAGNOSIS — J00 Acute nasopharyngitis [common cold]: Secondary | ICD-10-CM | POA: Diagnosis not present

## 2015-03-16 DIAGNOSIS — R5383 Other fatigue: Secondary | ICD-10-CM | POA: Diagnosis not present

## 2015-03-16 DIAGNOSIS — M1712 Unilateral primary osteoarthritis, left knee: Secondary | ICD-10-CM | POA: Diagnosis not present

## 2015-03-18 ENCOUNTER — Inpatient Hospital Stay (HOSPITAL_COMMUNITY): Payer: Medicare Other

## 2015-03-18 ENCOUNTER — Emergency Department (HOSPITAL_COMMUNITY): Payer: Medicare Other

## 2015-03-18 ENCOUNTER — Encounter (HOSPITAL_COMMUNITY): Payer: Self-pay | Admitting: Emergency Medicine

## 2015-03-18 ENCOUNTER — Inpatient Hospital Stay (HOSPITAL_COMMUNITY)
Admission: EM | Admit: 2015-03-18 | Discharge: 2015-03-21 | DRG: 871 | Disposition: A | Discharge status: Home Health | Payer: Medicare Other | Attending: Internal Medicine | Admitting: Internal Medicine

## 2015-03-18 DIAGNOSIS — I10 Essential (primary) hypertension: Secondary | ICD-10-CM | POA: Diagnosis present

## 2015-03-18 DIAGNOSIS — T07 Unspecified multiple injuries: Secondary | ICD-10-CM | POA: Diagnosis not present

## 2015-03-18 DIAGNOSIS — C8313 Mantle cell lymphoma, intra-abdominal lymph nodes: Secondary | ICD-10-CM | POA: Diagnosis present

## 2015-03-18 DIAGNOSIS — M6282 Rhabdomyolysis: Secondary | ICD-10-CM | POA: Diagnosis present

## 2015-03-18 DIAGNOSIS — E876 Hypokalemia: Secondary | ICD-10-CM | POA: Diagnosis present

## 2015-03-18 DIAGNOSIS — W19XXXD Unspecified fall, subsequent encounter: Secondary | ICD-10-CM | POA: Diagnosis not present

## 2015-03-18 DIAGNOSIS — Z87891 Personal history of nicotine dependence: Secondary | ICD-10-CM | POA: Diagnosis not present

## 2015-03-18 DIAGNOSIS — R55 Syncope and collapse: Secondary | ICD-10-CM | POA: Diagnosis not present

## 2015-03-18 DIAGNOSIS — R21 Rash and other nonspecific skin eruption: Secondary | ICD-10-CM | POA: Diagnosis not present

## 2015-03-18 DIAGNOSIS — J9811 Atelectasis: Secondary | ICD-10-CM | POA: Diagnosis not present

## 2015-03-18 DIAGNOSIS — Z96659 Presence of unspecified artificial knee joint: Secondary | ICD-10-CM | POA: Diagnosis present

## 2015-03-18 DIAGNOSIS — D6959 Other secondary thrombocytopenia: Secondary | ICD-10-CM | POA: Diagnosis present

## 2015-03-18 DIAGNOSIS — Z9221 Personal history of antineoplastic chemotherapy: Secondary | ICD-10-CM | POA: Diagnosis not present

## 2015-03-18 DIAGNOSIS — S0093XA Contusion of unspecified part of head, initial encounter: Secondary | ICD-10-CM | POA: Diagnosis present

## 2015-03-18 DIAGNOSIS — W19XXXA Unspecified fall, initial encounter: Secondary | ICD-10-CM | POA: Diagnosis present

## 2015-03-18 DIAGNOSIS — S0990XA Unspecified injury of head, initial encounter: Secondary | ICD-10-CM | POA: Diagnosis not present

## 2015-03-18 DIAGNOSIS — S0083XA Contusion of other part of head, initial encounter: Secondary | ICD-10-CM | POA: Diagnosis present

## 2015-03-18 DIAGNOSIS — R7989 Other specified abnormal findings of blood chemistry: Secondary | ICD-10-CM | POA: Diagnosis present

## 2015-03-18 DIAGNOSIS — A419 Sepsis, unspecified organism: Secondary | ICD-10-CM | POA: Diagnosis not present

## 2015-03-18 DIAGNOSIS — R509 Fever, unspecified: Secondary | ICD-10-CM | POA: Diagnosis not present

## 2015-03-18 DIAGNOSIS — J189 Pneumonia, unspecified organism: Secondary | ICD-10-CM | POA: Diagnosis not present

## 2015-03-18 DIAGNOSIS — E872 Acidosis: Secondary | ICD-10-CM | POA: Diagnosis present

## 2015-03-18 DIAGNOSIS — S0093XD Contusion of unspecified part of head, subsequent encounter: Secondary | ICD-10-CM | POA: Diagnosis not present

## 2015-03-18 DIAGNOSIS — F0390 Unspecified dementia without behavioral disturbance: Secondary | ICD-10-CM | POA: Diagnosis not present

## 2015-03-18 DIAGNOSIS — R319 Hematuria, unspecified: Secondary | ICD-10-CM | POA: Diagnosis present

## 2015-03-18 DIAGNOSIS — Z66 Do not resuscitate: Secondary | ICD-10-CM | POA: Diagnosis present

## 2015-03-18 DIAGNOSIS — T148 Other injury of unspecified body region: Secondary | ICD-10-CM | POA: Diagnosis not present

## 2015-03-18 DIAGNOSIS — Y92091 Bathroom in other non-institutional residence as the place of occurrence of the external cause: Secondary | ICD-10-CM

## 2015-03-18 DIAGNOSIS — Z823 Family history of stroke: Secondary | ICD-10-CM

## 2015-03-18 DIAGNOSIS — D696 Thrombocytopenia, unspecified: Secondary | ICD-10-CM | POA: Diagnosis present

## 2015-03-18 DIAGNOSIS — T360X5A Adverse effect of penicillins, initial encounter: Secondary | ICD-10-CM | POA: Diagnosis not present

## 2015-03-18 DIAGNOSIS — T50905A Adverse effect of unspecified drugs, medicaments and biological substances, initial encounter: Secondary | ICD-10-CM

## 2015-03-18 DIAGNOSIS — Y92009 Unspecified place in unspecified non-institutional (private) residence as the place of occurrence of the external cause: Secondary | ICD-10-CM

## 2015-03-18 DIAGNOSIS — R778 Other specified abnormalities of plasma proteins: Secondary | ICD-10-CM | POA: Diagnosis present

## 2015-03-18 DIAGNOSIS — K219 Gastro-esophageal reflux disease without esophagitis: Secondary | ICD-10-CM | POA: Diagnosis present

## 2015-03-18 DIAGNOSIS — S199XXA Unspecified injury of neck, initial encounter: Secondary | ICD-10-CM | POA: Diagnosis not present

## 2015-03-18 DIAGNOSIS — F03B0 Unspecified dementia, moderate, without behavioral disturbance, psychotic disturbance, mood disturbance, and anxiety: Secondary | ICD-10-CM | POA: Diagnosis present

## 2015-03-18 DIAGNOSIS — M25512 Pain in left shoulder: Secondary | ICD-10-CM | POA: Diagnosis not present

## 2015-03-18 DIAGNOSIS — M25519 Pain in unspecified shoulder: Secondary | ICD-10-CM

## 2015-03-18 DIAGNOSIS — S4992XA Unspecified injury of left shoulder and upper arm, initial encounter: Secondary | ICD-10-CM | POA: Diagnosis not present

## 2015-03-18 DIAGNOSIS — T796XXD Traumatic ischemia of muscle, subsequent encounter: Secondary | ICD-10-CM | POA: Diagnosis not present

## 2015-03-18 LAB — URINALYSIS, ROUTINE W REFLEX MICROSCOPIC
Bilirubin Urine: NEGATIVE
Glucose, UA: NEGATIVE mg/dL
Ketones, ur: NEGATIVE mg/dL
Leukocytes, UA: NEGATIVE
NITRITE: NEGATIVE
PROTEIN: 100 mg/dL — AB
Specific Gravity, Urine: 1.018 (ref 1.005–1.030)
Urobilinogen, UA: 1 mg/dL (ref 0.0–1.0)
pH: 5.5 (ref 5.0–8.0)

## 2015-03-18 LAB — TROPONIN I
TROPONIN I: 0.12 ng/mL — AB (ref <0.031)
Troponin I: 0.14 ng/mL — ABNORMAL HIGH (ref <0.031)
Troponin I: 0.13 ng/mL — ABNORMAL HIGH (ref <0.031)

## 2015-03-18 LAB — CBC WITH DIFFERENTIAL/PLATELET
BASOS ABS: 0 10*3/uL (ref 0.0–0.1)
Basophils Absolute: 0 10*3/uL (ref 0.0–0.1)
Basophils Relative: 0 % (ref 0–1)
Basophils Relative: 0 % (ref 0–1)
EOS ABS: 0.1 10*3/uL (ref 0.0–0.7)
EOS PCT: 1 % (ref 0–5)
Eosinophils Absolute: 0 10*3/uL (ref 0.0–0.7)
Eosinophils Relative: 1 % (ref 0–5)
HEMATOCRIT: 46.2 % (ref 39.0–52.0)
HEMATOCRIT: 43.3 % (ref 39.0–52.0)
HEMOGLOBIN: 16.1 g/dL (ref 13.0–17.0)
Hemoglobin: 15 g/dL (ref 13.0–17.0)
LYMPHS ABS: 0.2 10*3/uL — AB (ref 0.7–4.0)
LYMPHS ABS: 0.5 10*3/uL — AB (ref 0.7–4.0)
Lymphocytes Relative: 6 % — ABNORMAL LOW (ref 12–46)
Lymphocytes Relative: 13 % (ref 12–46)
MCH: 31.7 pg (ref 26.0–34.0)
MCH: 31.5 pg (ref 26.0–34.0)
MCHC: 34.8 g/dL (ref 30.0–36.0)
MCHC: 34.6 g/dL (ref 30.0–36.0)
MCV: 90.9 fL (ref 78.0–100.0)
MCV: 91 fL (ref 78.0–100.0)
MONOS PCT: 5 % (ref 3–12)
Monocytes Absolute: 0.2 10*3/uL (ref 0.1–1.0)
Monocytes Absolute: 0.2 10*3/uL (ref 0.1–1.0)
Monocytes Relative: 6 % (ref 3–12)
NEUTROS ABS: 3.4 10*3/uL (ref 1.7–7.7)
Neutro Abs: 2.9 10*3/uL (ref 1.7–7.7)
Neutrophils Relative %: 87 % — ABNORMAL HIGH (ref 43–77)
Neutrophils Relative %: 80 % — ABNORMAL HIGH (ref 43–77)
Platelets: 32 10*3/uL — ABNORMAL LOW (ref 150–400)
Platelets: 32 10*3/uL — ABNORMAL LOW (ref 150–400)
RBC: 5.08 MIL/uL (ref 4.22–5.81)
RBC: 4.76 MIL/uL (ref 4.22–5.81)
RDW: 13.2 % (ref 11.5–15.5)
RDW: 13.2 % (ref 11.5–15.5)
WBC: 3.9 10*3/uL — ABNORMAL LOW (ref 4.0–10.5)
WBC: 3.6 10*3/uL — ABNORMAL LOW (ref 4.0–10.5)

## 2015-03-18 LAB — COMPREHENSIVE METABOLIC PANEL
ALBUMIN: 3.5 g/dL (ref 3.5–5.2)
ALT: 35 U/L (ref 0–53)
AST: 55 U/L — ABNORMAL HIGH (ref 0–37)
Alkaline Phosphatase: 67 U/L (ref 39–117)
Anion gap: 9 (ref 5–15)
BUN: 18 mg/dL (ref 6–23)
CHLORIDE: 102 mmol/L (ref 96–112)
CO2: 22 mmol/L (ref 19–32)
CREATININE: 1.2 mg/dL (ref 0.50–1.35)
Calcium: 8 mg/dL — ABNORMAL LOW (ref 8.4–10.5)
GFR calc Af Amer: 62 mL/min — ABNORMAL LOW (ref >90)
GFR calc non Af Amer: 53 mL/min — ABNORMAL LOW (ref >90)
Glucose, Bld: 134 mg/dL — ABNORMAL HIGH (ref 70–99)
Potassium: 4.2 mmol/L (ref 3.5–5.1)
Sodium: 133 mmol/L — ABNORMAL LOW (ref 135–145)
TOTAL PROTEIN: 5.5 g/dL — AB (ref 6.0–8.3)
Total Bilirubin: 1.4 mg/dL — ABNORMAL HIGH (ref 0.3–1.2)

## 2015-03-18 LAB — URINE MICROSCOPIC-ADD ON

## 2015-03-18 LAB — I-STAT CG4 LACTIC ACID, ED: Lactic Acid, Venous: 1.46 mmol/L (ref 0.5–2.0)

## 2015-03-18 LAB — LACTIC ACID, PLASMA
Lactic Acid, Venous: 1.6 mmol/L (ref 0.5–2.0)
Lactic Acid, Venous: 2.2 mmol/L (ref 0.5–2.0)

## 2015-03-18 LAB — CK: CK TOTAL: 3664 U/L — AB (ref 7–232)

## 2015-03-18 LAB — PROTIME-INR
INR: 1.09 (ref 0.00–1.49)
Prothrombin Time: 14.3 seconds (ref 11.6–15.2)

## 2015-03-18 LAB — PROCALCITONIN: Procalcitonin: 0.84 ng/mL

## 2015-03-18 LAB — APTT: aPTT: 33 seconds (ref 24–37)

## 2015-03-18 MED ORDER — SODIUM CHLORIDE 0.9 % IV BOLUS (SEPSIS)
500.0000 mL | Freq: Once | INTRAVENOUS | Status: AC
  Administered 2015-03-18: 500 mL via INTRAVENOUS

## 2015-03-18 MED ORDER — SODIUM CHLORIDE 0.9 % IV SOLN
INTRAVENOUS | Status: DC
  Administered 2015-03-18 – 2015-03-20 (×4): via INTRAVENOUS

## 2015-03-18 MED ORDER — PANTOPRAZOLE SODIUM 40 MG PO TBEC
40.0000 mg | DELAYED_RELEASE_TABLET | Freq: Every day | ORAL | Status: DC
  Administered 2015-03-18: 40 mg via ORAL
  Filled 2015-03-18: qty 1

## 2015-03-18 MED ORDER — OCUVITE-LUTEIN PO CAPS
1.0000 | ORAL_CAPSULE | Freq: Two times a day (BID) | ORAL | Status: DC
  Administered 2015-03-18 – 2015-03-21 (×7): 1 via ORAL
  Filled 2015-03-18 (×8): qty 1

## 2015-03-18 MED ORDER — ACETAMINOPHEN 325 MG PO TABS
650.0000 mg | ORAL_TABLET | Freq: Four times a day (QID) | ORAL | Status: DC | PRN
  Administered 2015-03-19 – 2015-03-21 (×3): 650 mg via ORAL
  Filled 2015-03-18 (×3): qty 2

## 2015-03-18 MED ORDER — EYE-VITE PLUS LUTEIN PO CAPS: 1.0000 | ORAL_CAPSULE | Freq: Two times a day (BID) | ORAL | Status: DC

## 2015-03-18 MED ORDER — PIPERACILLIN-TAZOBACTAM 3.375 G IVPB 30 MIN
3.3750 g | Freq: Once | INTRAVENOUS | Status: AC
  Administered 2015-03-18: 3.375 g via INTRAVENOUS
  Filled 2015-03-18: qty 50

## 2015-03-18 MED ORDER — SODIUM CHLORIDE 0.9 % IV SOLN
INTRAVENOUS | Status: DC
  Administered 2015-03-18: 12:00:00 via INTRAVENOUS

## 2015-03-18 MED ORDER — PIPERACILLIN-TAZOBACTAM 3.375 G IVPB
3.3750 g | Freq: Three times a day (TID) | INTRAVENOUS | Status: DC
  Administered 2015-03-18 – 2015-03-19 (×2): 3.375 g via INTRAVENOUS
  Filled 2015-03-18 (×4): qty 50

## 2015-03-18 MED ORDER — ONDANSETRON HCL 4 MG/2ML IJ SOLN: 4.0000 mg | Freq: Four times a day (QID) | INTRAMUSCULAR | Status: DC | PRN

## 2015-03-18 MED ORDER — PANTOPRAZOLE SODIUM 40 MG PO TBEC
40.0000 mg | DELAYED_RELEASE_TABLET | Freq: Two times a day (BID) | ORAL | Status: DC
  Administered 2015-03-18 – 2015-03-21 (×6): 40 mg via ORAL
  Filled 2015-03-18 (×5): qty 1

## 2015-03-18 MED ORDER — SODIUM CHLORIDE 0.9 % IV BOLUS (SEPSIS): 500.0000 mL | INTRAVENOUS | Status: DC

## 2015-03-18 MED ORDER — ACETAMINOPHEN 650 MG RE SUPP
650.0000 mg | Freq: Four times a day (QID) | RECTAL | Status: DC | PRN
Start: 2015-03-18 — End: 2015-03-21

## 2015-03-18 MED ORDER — ACETAMINOPHEN 650 MG RE SUPP
650.0000 mg | RECTAL | Status: DC | PRN
  Administered 2015-03-18: 650 mg via RECTAL
  Filled 2015-03-18: qty 1

## 2015-03-18 MED ORDER — GUAIFENESIN-DM 100-10 MG/5ML PO SYRP: 5.0000 mL | ORAL_SOLUTION | ORAL | Status: DC | PRN

## 2015-03-18 MED ORDER — ALUM & MAG HYDROXIDE-SIMETH 200-200-20 MG/5ML PO SUSP: 30.0000 mL | Freq: Four times a day (QID) | ORAL | Status: DC | PRN

## 2015-03-18 MED ORDER — ONDANSETRON 4 MG PO TBDP: 4.0000 mg | ORAL_TABLET | Freq: Three times a day (TID) | ORAL | Status: DC | PRN

## 2015-03-18 MED ORDER — SODIUM CHLORIDE 0.9 % IV BOLUS (SEPSIS)
1000.0000 mL | INTRAVENOUS | Status: AC
  Administered 2015-03-18: 1000 mL via INTRAVENOUS

## 2015-03-18 MED ORDER — DOCUSATE SODIUM 100 MG PO CAPS
100.0000 mg | ORAL_CAPSULE | Freq: Two times a day (BID) | ORAL | Status: DC
  Administered 2015-03-18 – 2015-03-20 (×6): 100 mg via ORAL
  Filled 2015-03-18 (×9): qty 1

## 2015-03-18 MED ORDER — ONDANSETRON HCL 4 MG PO TABS: 4.0000 mg | ORAL_TABLET | Freq: Four times a day (QID) | ORAL | Status: DC | PRN

## 2015-03-18 MED ORDER — VANCOMYCIN HCL IN DEXTROSE 750-5 MG/150ML-% IV SOLN
750.0000 mg | Freq: Two times a day (BID) | INTRAVENOUS | Status: DC
  Administered 2015-03-18 – 2015-03-20 (×4): 750 mg via INTRAVENOUS
  Filled 2015-03-18 (×6): qty 150

## 2015-03-18 MED ORDER — MEMANTINE HCL ER 28 MG PO CP24
28.0000 mg | ORAL_CAPSULE | Freq: Every day | ORAL | Status: DC
Start: 2015-03-18 — End: 2015-03-21
  Administered 2015-03-18 – 2015-03-21 (×4): 28 mg via ORAL
  Filled 2015-03-18 (×4): qty 1

## 2015-03-18 MED ORDER — SODIUM CHLORIDE 0.9 % IJ SOLN
3.0000 mL | Freq: Two times a day (BID) | INTRAMUSCULAR | Status: DC
  Administered 2015-03-18 – 2015-03-21 (×6): 3 mL via INTRAVENOUS

## 2015-03-18 MED ORDER — VANCOMYCIN HCL IN DEXTROSE 1-5 GM/200ML-% IV SOLN
1000.0000 mg | Freq: Once | INTRAVENOUS | Status: AC
  Administered 2015-03-18: 1000 mg via INTRAVENOUS
  Filled 2015-03-18: qty 200

## 2015-03-18 MED ORDER — SODIUM CHLORIDE 0.9 % IV SOLN: INTRAVENOUS | Status: DC

## 2015-03-18 NOTE — ED Notes (Signed)
[name] wife found the pt on the bathroom floor. Pt presents to the department with a hematoma to his left forehead, hematoma to his left back, abrasions to his left chest, left arm, left thigh, skin tear to his left elbow, bruising to his left wrist and left hand. Pt has a hx of dementia, wife reports his confusion as increased greatly this month. Pt cannot report event tonight. Pt incontinent.

## 2015-03-18 NOTE — Progress Notes (Signed)
ANTIBIOTIC CONSULT NOTE - INITIAL  Pharmacy Consult for vancomycin and zosyn Indication: rule out sepsis  Allergies  Allergen Reactions  . Aricept [Donepezil Hcl] Other (See Comments)    Unknown     Patient Measurements: Height: 6\' 1"  (185.4 cm) Weight: 167 lb 15.9 oz (76.2 kg) (scale B) IBW/kg (Calculated) : 79.9  Vital Signs: Temp: 98.2 F (36.8 C) (04/22 0853) Temp Source: Oral (04/22 0853) BP: 124/58 mmHg (04/22 0853) Pulse Rate: 68 (04/22 0853) Intake/Output from previous day: 04/21 0701 - 04/22 0700 In: -  Out: 450 [Urine:450] Intake/Output from this shift:    Labs:  Recent Labs  03/18/15 0409  WBC 3.9*  HGB 16.1  PLT 32*  CREATININE 1.20   Estimated Creatinine Clearance: 48.5 mL/min (by C-G formula based on Cr of 1.2). No results for input(s): VANCOTROUGH, VANCOPEAK, VANCORANDOM, GENTTROUGH, GENTPEAK, GENTRANDOM, TOBRATROUGH, TOBRAPEAK, TOBRARND, AMIKACINPEAK, AMIKACINTROU, AMIKACIN in the last 72 hours.   Microbiology: No results found for this or any previous visit (from the past 720 hour(s)).  Medical History: Past Medical History  Diagnosis Date  . Mantle cell lymphoma of intra-abdominal lymph nodes 03/04/2012    Dx 01/07/09 dominant mass R pelvis 4.8 x 2.3 cm  bx R inguinal node 02/01/09 Rx R-CVP x 4 then 4x CNovantrone VP  . Hypertension   . GERD (gastroesophageal reflux disease)   . Dementia   . Hematuria 08/23/2014    Medications:  Prescriptions prior to admission  Medication Sig Dispense Refill Last Dose  . aspirin 81 MG tablet Take 81 mg by mouth daily.   03/17/2015 at Unknown time  . ibrutinib (IMBRUVICA) 140 MG capsul Take 3 capsules (420 mg total) by mouth daily. 90 capsule 3 03/17/2015 at Unknown time  . Multiple Vitamins-Minerals (EYE-VITE PLUS LUTEIN) CAPS Take 1 capsule by mouth 2 (two) times daily.   03/17/2015 at Unknown time  . NAMENDA XR 28 MG CP24 24 hr capsule Take 28 mg by mouth daily.    03/17/2015 at Unknown time  . omeprazole  (PRILOSEC) 20 MG capsule Take 20 mg by mouth daily.   03/17/2015 at Unknown time  . Memantine HCl (NAMENDA XR PO) Take 1 tablet by mouth daily. 28 mg   Taking  . ondansetron (ZOFRAN ODT) 4 MG disintegrating tablet Take 1 tablet (4 mg total) by mouth every 8 (eight) hours as needed for nausea or vomiting. (Patient not taking: Reported on 03/18/2015) 20 tablet 6 Not Taking at Unknown time   Assessment: 81 yo man found down at home to start broad spectrum antibiotics for sepsis.  He has mantle cell lymphoma on chemo.  WBC 3.9 with left shift     LA 1.46  Tmax 103  CrCl ~48 ml/min  Goal of Therapy:  Vancomycin trough level 15-20 mcg/ml  Plan:  Vancomycin 1 gm and zosyn 3.375 gm ordered by MD Will continue zosyn 3.375 gm IV q8 hours IE Vancomycin 750 mg IV q12 hours Monitor renal function, cultures and clinical course F/u vanc trough when appropriate  Thanks for allowing pharmacy to be a part of this patient's care.  Excell Seltzer, PharmD Clinical Pharmacist, [phone]  03/18/2015,9:40 AM

## 2015-03-18 NOTE — ED Notes (Signed)
CT called to inquire about when pt can be scanned.

## 2015-03-18 NOTE — ED Notes (Signed)
Provider aware of Trop.

## 2015-03-18 NOTE — H&P (Signed)
[facility] History and Physical                                                                                    Jeffrey Simmons, is a 81 y.o. male  MRN: [id number]   DOB - 1927/10/03  Admit Date - 03/18/2015  Outpatient Primary MD for the patient is Mathews Argyle, MD  Referring Physician:  Dr. Sharol Given, EDP  Chief Complaint:   Chief Complaint  Patient presents with  . Fever  . Fall     HPI  Jeffrey Simmons  is a 81 y.o. male, with a past medical history of mantle cell lymphoma on chemotherapy, hypertension, GERD, dementia, and iatrogenic thrombocytopenia.  His wife found him down on the floor when she awoke this morning. She is uncertain of how long he had been there. The patient is unable to give any details due to his dementia. He does not recall the fall. He denies all pain. He is unable to tell me why he visited his primary care physician Dr. Felipa Eth on April 20. According to the EDP notes, the patient and his wife were told on April 20 that he had a virus.  In the emergency department this morning the patient has a large hematoma on the left side of his head, he has petechiae in the back of his oropharynx, his temperature was 103 in the ER, platelets 32,000, troponin 0.14, CK is 3664.  CT scans of his head and cervical spine as well as x-rays of his left knee and chest were negative for abnormalities.  Review of Systems   Patient is unable to give a review of systems secondary to his dementia, but he denies pain or recent symptoms of infection/illness.  Past Medical History  Past Medical History  Diagnosis Date  . Mantle cell lymphoma of intra-abdominal lymph nodes 03/04/2012    Dx 01/07/09 dominant mass R pelvis 4.8 x 2.3 cm  bx R inguinal node 02/01/09 Rx R-CVP x 4 then 4x CNovantrone VP  . Hypertension   . GERD (gastroesophageal reflux disease)   . Dementia   . Hematuria 08/23/2014    Past Surgical History  Procedure Laterality Date  . Replacement total knee  2008       Social History History  Substance Use Topics  . Smoking status: Former Smoker    Quit date: 11/26/1966  . Smokeless tobacco: Never Used  . Alcohol Use: Yes     Comment: 1 or 2 beers daily   lives at home with his wife. Was able to walk.  Family History Family History  Problem Relation Age of Onset  . Stroke Mother     Prior to Admission medications   Medication Sig Start Date End Date Taking? Authorizing Provider  aspirin 81 MG tablet Take 81 mg by mouth daily.   Yes Historical Provider, MD  ibrutinib (IMBRUVICA) 140 MG capsul Take 3 capsules (420 mg total) by mouth daily. 12/30/14  Yes Heath Lark, MD  Multiple Vitamins-Minerals (EYE-VITE PLUS LUTEIN) CAPS Take 1 capsule by mouth 2 (two) times daily.   Yes Historical Provider, MD  NAMENDA XR 28 MG CP24 24 hr capsule Take 28 mg  by mouth daily.  02/14/15  Yes Historical Provider, MD  omeprazole (PRILOSEC) 20 MG capsule Take 20 mg by mouth daily.   Yes Historical Provider, MD  Memantine HCl (NAMENDA XR PO) Take 1 tablet by mouth daily. 28 mg    Historical Provider, MD  ondansetron (ZOFRAN ODT) 4 MG disintegrating tablet Take 1 tablet (4 mg total) by mouth every 8 (eight) hours as needed for nausea or vomiting. Patient not taking: Reported on 03/18/2015 12/25/13   Annia Belt, MD    Allergies  Allergen Reactions  . Aricept [Donepezil Hcl] Other (See Comments)    Unknown     Physical Exam  Vitals  Blood pressure 112/53, pulse 70, temperature 98 F (36.7 C), temperature source Oral, resp. rate 21, SpO2 94 %.   General: Well-developed, well-nourished, elderly male lying in bed in NAD. Large hematoma on the left side of his head that encircles his left eye.  Psych:  Pleasantly demented. Unable to give history.  Neuro:   Follows commands. Does not appear to have focal neuro deficits.  ENT:  Ears and Eyes appear Normal, Conjunctivae clear, PER. Moist oral mucosa without erythema or exudates.  Left eye with  significant ecchymosis.  Neck:  Supple, No lymphadenopathy appreciated  Respiratory:  Symmetrical chest wall movement, Good air movement bilaterally, CTAB.  Cardiac:  RRR, No Murmurs, no LE edema noted, no JVD.    Abdomen:  Positive bowel sounds, Soft, Non tender, Non distended,  No masses appreciated  Extremities:  Able to move all 4. 5/5 strength in each,  no effusions.  Data Review  CBC  Recent Labs Lab 03/18/15 0409  WBC 3.9*  HGB 16.1  HCT 46.2  PLT 32*  MCV 90.9  MCH 31.7  MCHC 34.8  RDW 13.2  LYMPHSABS 0.2*  MONOABS 0.2  EOSABS 0.0  BASOSABS 0.0    Chemistries   Recent Labs Lab 03/18/15 0409  NA 133*  K 4.2  CL 102  CO2 22  GLUCOSE 134*  BUN 18  CREATININE 1.20  CALCIUM 8.0*  AST 55*  ALT 35  ALKPHOS 67  BILITOT 1.4*     Cardiac Enzymes  Recent Labs Lab 03/18/15 0722  TROPONINI 0.14*     Urinalysis    Component Value Date/Time   COLORURINE AMBER* 03/18/2015 0412   APPEARANCEUR CLEAR 03/18/2015 0412   LABSPEC 1.018 03/18/2015 0412   LABSPEC 1.025 08/25/2014 1035   PHURINE 5.5 03/18/2015 0412   GLUCOSEU NEGATIVE 03/18/2015 0412   GLUCOSEU Negative 08/25/2014 1035   HGBUR MODERATE* 03/18/2015 0412   BILIRUBINUR NEGATIVE 03/18/2015 0412   KETONESUR NEGATIVE 03/18/2015 0412   PROTEINUR 100* 03/18/2015 0412   UROBILINOGEN 1.0 03/18/2015 0412   UROBILINOGEN 0.2 08/25/2014 1035   NITRITE NEGATIVE 03/18/2015 0412   NITRITE Negative 08/25/2014 1035   LEUKOCYTESUR NEGATIVE 03/18/2015 0412    Imaging results:   Ct Head Wo Contrast  03/18/2015   CLINICAL DATA:  Fall in bathroom.  Hit right side of head.  EXAM: CT HEAD WITHOUT CONTRAST  CT CERVICAL SPINE WITHOUT CONTRAST  TECHNIQUE: Multidetector CT imaging of the head and cervical spine was performed following the standard protocol without intravenous contrast. Multiplanar CT image reconstructions of the cervical spine were also generated.  COMPARISON:  None.  FINDINGS: CT HEAD FINDINGS   Soft tissue swelling in the left side of the forehead. No underlying calvarial fracture. There is atrophy and chronic small vessel disease changes. No acute intracranial abnormality. Specifically, no hemorrhage, hydrocephalus, mass  lesion, acute infarction, or significant intracranial injury. No acute calvarial abnormality.  CT CERVICAL SPINE FINDINGS  Mild degenerative disc disease changes at C5-6 and C6-7 with disc space narrowing and spurring. Mild diffuse degenerative facet disease. Normal alignment. Prevertebral soft tissues are normal. No fracture. No epidural or paraspinal hematoma.  IMPRESSION: No acute intracranial abnormality.  Spondylosis.  No acute bony abnormality in the cervical spine.   Electronically Signed   By: Rolm Baptise M.D.   On: 03/18/2015 06:09   Ct Cervical Spine Wo Contrast  03/18/2015   CLINICAL DATA:  Fall in bathroom.  Hit right side of head.  EXAM: CT HEAD WITHOUT CONTRAST  CT CERVICAL SPINE WITHOUT CONTRAST  TECHNIQUE: Multidetector CT imaging of the head and cervical spine was performed following the standard protocol without intravenous contrast. Multiplanar CT image reconstructions of the cervical spine were also generated.  COMPARISON:  None.  FINDINGS: CT HEAD FINDINGS  Soft tissue swelling in the left side of the forehead. No underlying calvarial fracture. There is atrophy and chronic small vessel disease changes. No acute intracranial abnormality. Specifically, no hemorrhage, hydrocephalus, mass lesion, acute infarction, or significant intracranial injury. No acute calvarial abnormality.  CT CERVICAL SPINE FINDINGS  Mild degenerative disc disease changes at C5-6 and C6-7 with disc space narrowing and spurring. Mild diffuse degenerative facet disease. Normal alignment. Prevertebral soft tissues are normal. No fracture. No epidural or paraspinal hematoma.  IMPRESSION: No acute intracranial abnormality.  Spondylosis.  No acute bony abnormality in the cervical spine.    Electronically Signed   By: Rolm Baptise M.D.   On: 03/18/2015 06:09   Dg Chest Port 1 View  (if Code Sepsis Called)  03/18/2015   CLINICAL DATA:  Found on floor. Hematoma to left forehead. Abrasions to left chest.  EXAM: PORTABLE CHEST - 1 VIEW  COMPARISON:  Chest CT 11/01/2014  FINDINGS: Minimal bibasilar atelectasis. Heart is normal size. No effusions. No pneumothorax. No acute bony abnormality.  IMPRESSION: Bibasilar atelectasis.   Electronically Signed   By: Rolm Baptise M.D.   On: 03/18/2015 04:15   Dg Knee Complete 4 Views Left  03/16/2015   CLINICAL DATA:  Left knee pain, possible arthritis  EXAM: LEFT KNEE - COMPLETE 4+ VIEW  COMPARISON:  None.  FINDINGS: Four views of the left knee submitted. No acute fracture or subluxation. Mild narrowing of medial joint compartment. There is narrowing of patellofemoral joint space. Small joint effusion. Spurring of patella.  IMPRESSION: No acute fracture or subluxation. Mild osteoarthritic changes as described above.   Electronically Signed   By: Lahoma Crocker M.D.   On: 03/16/2015 15:09    My personal review of EKG: NSR, No ST changes noted.   Assessment & Plan  Principal Problem:   Sepsis Active Problems:   Thrombocytopenia due to drugs   Traumatic hematoma of head   Rhabdomyolysis   Mantle cell lymphoma of intra-abdominal lymph nodes   Moderate dementia without behavioral disturbance   Fall at home   Elevated troponin  SIRS Patient with temperature 103, elevated respiration rate on admission, and uncertain source. His PCP felt he had a virus on 4/20. Admitted using sepsis protocol. He will receive fluids and has been placed on broad-spectrum antibiotics. Blood and urine cultures are pending. Lactic acid was not elevated.   Rhabdomyolysis Presumed secondary to fall and lying on the floor.  Patient receiving IV fluids. Will recheck CK on 4/23. Renal function appears intact.  Hematoma of the head with thrombocytopenia Hematoma  secondary  to fall. Monitor closely. If it continues to enlarge we will transfuse platelets.  Dr. Tyrell Antonio spoke with Dr. Alvy Bimler who recommended transfusion if platelets drop to 20,000 or below or if hematoma continues to enlarge.  Elevated troponin Patient denies chest pain, EKG shows normal sinus rhythm without ST elevation or depression.  We will continue to cycle troponins and call cardiology if there is significant elevation. Not giving aspirin or heparin at this time due to risk of bleeding.  Mantle cell lymphoma  After discussion with oncology will hold chemotherapy medication.  Dr. Alvy Bimler is not available until Monday.  If onc is needed please formally consult them.  Moderate Dementia without disturbance. Continue Namenda,  Hematuria Chronic   GERD Continue PPI  Consultants Called:  Discussed with Dr. Alvy Bimler, but please call a formal consult if Onc is needed as Dr. Alvy Bimler is unable to see him before Monday.  Family Communication:   Wife at bedside Code Status:  DNR  Condition:  Guarded.  Potential Disposition:  Home vs SNF estimated 3 days.  Time spent in minutes : [address]  PA-C on 03/18/2015 at 8:50 AM Between 7am to 7pm - Pager - [phone] After 7pm go to [email or web address] - password [email or web address] And look for the night coverage person covering me after hours  [facility]

## 2015-03-18 NOTE — Progress Notes (Signed)
CRITICAL VALUE ALERT  Critical value received:  Lactic Acid 2.2  Date of notification:  03-18-15  Time of notification:  1500  Critical value read back:yes  Nurse who received alert:  Soledad Gerlach  MD notified (1st page):  [place]  Time of first page:  1508  MD notified (2nd page):  Time of second page:  Responding MD:  Tyrell Antonio  Time MD responded:  [phone]

## 2015-03-18 NOTE — ED Notes (Signed)
Attempted Report 

## 2015-03-18 NOTE — ED Notes (Signed)
Admitting Provider at the bedside.  

## 2015-03-18 NOTE — ED Provider Notes (Signed)
CSN: [id number]     Arrival date & time 03/18/15  [id number] History  This chart was scribed for Linton Flemings, MD by Rayfield Citizen, ED Scribe. This patient was seen in room D36C/D36C and the patient's care was started at 4:15 AM.    Chief Complaint  Patient presents with  . Fever  . Fall   The history is provided by the patient. No language interpreter was used.     HPI Comments: Jeffrey Simmons is a 81 y.o. male with past medical history of HTN, GERD, dementia, hematuria, thrombocytopenia, lymphoma who presents to the Emergency Department complaining of fall. Patient's wife reports that she found him on the bathroom floor just PTA tonight; she is unsure how long he might have been lying on the floor. Patient himself cannot recall the incident.  It is unclear if he passed out or if he stumbled and fell.  She also states he was seen at his PCP's on 03/16/15 and was told he had a "virus." He is not on any anticoagulants at this time. He denies neck pain.  Patient noted to have a temperature of 103 upon arrival.  He denies any cough, no headache no neck pain, no abdominal pain, no nausea, vomiting, diarrhea or urinary complaints  PCP Dr. Felipa Eth. Patient currently undergoing chemotherapy for mantle cell lymphoma of intra-abdominal lymph nodes.   Past Medical History  Diagnosis Date  . Mantle cell lymphoma of intra-abdominal lymph nodes 03/04/2012    Dx 01/07/09 dominant mass R pelvis 4.8 x 2.3 cm  bx R inguinal node 02/01/09 Rx R-CVP x 4 then 4x CNovantrone VP  . Hypertension   . GERD (gastroesophageal reflux disease)   . Dementia   . Hematuria 08/23/2014   Past Surgical History  Procedure Laterality Date  . Replacement total knee  2008   Family History  Problem Relation Age of Onset  . Stroke Mother    History  Substance Use Topics  . Smoking status: Former Smoker    Quit date: 11/26/1966  . Smokeless tobacco: Never Used  . Alcohol Use: Yes     Comment: 1 or 2 beers daily    Review of Systems   All other systems reviewed and are negative.   Allergies  Aricept  Home Medications   Prior to Admission medications   Medication Sig Start Date End Date Taking? Authorizing Provider  aspirin 81 MG tablet Take 81 mg by mouth daily.    Historical Provider, MD  ibrutinib (IMBRUVICA) 140 MG capsul Take 3 capsules (420 mg total) by mouth daily. 12/30/14   Heath Lark, MD  Memantine HCl (NAMENDA XR PO) Take 1 tablet by mouth daily. 28 mg    Historical Provider, MD  Multiple Vitamins-Minerals (EYE-VITE PLUS LUTEIN) CAPS Take 1 capsule by mouth 2 (two) times daily.    Historical Provider, MD  omeprazole (PRILOSEC) 20 MG capsule Take 20 mg by mouth daily.    Historical Provider, MD  ondansetron (ZOFRAN ODT) 4 MG disintegrating tablet Take 1 tablet (4 mg total) by mouth every 8 (eight) hours as needed for nausea or vomiting. 12/25/13   Annia Belt, MD   Temp(Src) 103 F (39.4 C) (Rectal)  Resp 24  SpO2 95% Physical Exam  Constitutional: He is oriented to person, place, and time. He appears well-developed and well-nourished. No distress.  HENT:  Head: Normocephalic.  Left Ear: Left ear decreased hearing: Blood in the right TM.  Mouth/Throat: Oropharynx is clear and moist. No oropharyngeal exudate.  Periorbital  hematoma.   Eyes: EOM are normal. Pupils are equal, round, and reactive to light.  Neck: Normal range of motion. Neck supple. No JVD present.  Cardiovascular: Normal rate, regular rhythm and normal heart sounds.  Exam reveals no gallop and no friction rub.   No murmur heard. Pulmonary/Chest: Effort normal and breath sounds normal. No respiratory distress. He has no wheezes. He has no rales.  Abdominal: Soft. Bowel sounds are normal. He exhibits no mass. There is no tenderness. There is no rebound and no guarding.  Musculoskeletal: Normal range of motion. He exhibits no edema.  Moves all extremities normally.   Lymphadenopathy:    He has no cervical adenopathy.  Neurological:  He is alert and oriented to person, place, and time. He displays normal reflexes.  Skin: Skin is warm and dry. No rash noted.  Scattered petechiae   Psychiatric: He has a normal mood and affect. His behavior is normal.  Nursing note and vitals reviewed.   ED Course  Procedures   DIAGNOSTIC STUDIES: Oxygen Saturation is 95% on Exline 2L/min, adequate by my interpretation.    COORDINATION OF CARE: 4:20 AM Discussed treatment plan with pt at bedside and pt agreed to plan.   Labs Review Labs Reviewed  CBC WITH DIFFERENTIAL/PLATELET - Abnormal; Notable for the following:    WBC 3.9 (*)    Platelets 32 (*)    Neutrophils Relative % 87 (*)    Lymphocytes Relative 6 (*)    Lymphs Abs 0.2 (*)    All other components within normal limits  COMPREHENSIVE METABOLIC PANEL - Abnormal; Notable for the following:    Sodium 133 (*)    Glucose, Bld 134 (*)    Calcium 8.0 (*)    Total Protein 5.5 (*)    AST 55 (*)    Total Bilirubin 1.4 (*)    GFR calc non Af Amer 53 (*)    GFR calc Af Amer 62 (*)    All other components within normal limits  URINALYSIS, ROUTINE W REFLEX MICROSCOPIC - Abnormal; Notable for the following:    Color, Urine AMBER (*)    Hgb urine dipstick MODERATE (*)    Protein, ur 100 (*)    All other components within normal limits  CULTURE, BLOOD (ROUTINE X 2)  CULTURE, BLOOD (ROUTINE X 2)  URINE MICROSCOPIC-ADD ON  TROPONIN I  I-STAT CG4 LACTIC ACID, ED    Imaging Review Ct Head Wo Contrast  03/18/2015   CLINICAL DATA:  Fall in bathroom.  Hit right side of head.  EXAM: CT HEAD WITHOUT CONTRAST  CT CERVICAL SPINE WITHOUT CONTRAST  TECHNIQUE: Multidetector CT imaging of the head and cervical spine was performed following the standard protocol without intravenous contrast. Multiplanar CT image reconstructions of the cervical spine were also generated.  COMPARISON:  None.  FINDINGS: CT HEAD FINDINGS  Soft tissue swelling in the left side of the forehead. No underlying  calvarial fracture. There is atrophy and chronic small vessel disease changes. No acute intracranial abnormality. Specifically, no hemorrhage, hydrocephalus, mass lesion, acute infarction, or significant intracranial injury. No acute calvarial abnormality.  CT CERVICAL SPINE FINDINGS  Mild degenerative disc disease changes at C5-6 and C6-7 with disc space narrowing and spurring. Mild diffuse degenerative facet disease. Normal alignment. Prevertebral soft tissues are normal. No fracture. No epidural or paraspinal hematoma.  IMPRESSION: No acute intracranial abnormality.  Spondylosis.  No acute bony abnormality in the cervical spine.   Electronically Signed   By: Rolm Baptise  M.D.   On: 03/18/2015 06:09   Ct Cervical Spine Wo Contrast  03/18/2015   CLINICAL DATA:  Fall in bathroom.  Hit right side of head.  EXAM: CT HEAD WITHOUT CONTRAST  CT CERVICAL SPINE WITHOUT CONTRAST  TECHNIQUE: Multidetector CT imaging of the head and cervical spine was performed following the standard protocol without intravenous contrast. Multiplanar CT image reconstructions of the cervical spine were also generated.  COMPARISON:  None.  FINDINGS: CT HEAD FINDINGS  Soft tissue swelling in the left side of the forehead. No underlying calvarial fracture. There is atrophy and chronic small vessel disease changes. No acute intracranial abnormality. Specifically, no hemorrhage, hydrocephalus, mass lesion, acute infarction, or significant intracranial injury. No acute calvarial abnormality.  CT CERVICAL SPINE FINDINGS  Mild degenerative disc disease changes at C5-6 and C6-7 with disc space narrowing and spurring. Mild diffuse degenerative facet disease. Normal alignment. Prevertebral soft tissues are normal. No fracture. No epidural or paraspinal hematoma.  IMPRESSION: No acute intracranial abnormality.  Spondylosis.  No acute bony abnormality in the cervical spine.   Electronically Signed   By: Rolm Baptise M.D.   On: 03/18/2015 06:09   Dg  Chest Port 1 View  (if Code Sepsis Called)  03/18/2015   CLINICAL DATA:  Found on floor. Hematoma to left forehead. Abrasions to left chest.  EXAM: PORTABLE CHEST - 1 VIEW  COMPARISON:  Chest CT 11/01/2014  FINDINGS: Minimal bibasilar atelectasis. Heart is normal size. No effusions. No pneumothorax. No acute bony abnormality.  IMPRESSION: Bibasilar atelectasis.   Electronically Signed   By: Rolm Baptise M.D.   On: 03/18/2015 04:15   Dg Knee Complete 4 Views Left  03/16/2015   CLINICAL DATA:  Left knee pain, possible arthritis  EXAM: LEFT KNEE - COMPLETE 4+ VIEW  COMPARISON:  None.  FINDINGS: Four views of the left knee submitted. No acute fracture or subluxation. Mild narrowing of medial joint compartment. There is narrowing of patellofemoral joint space. Small joint effusion. Spurring of patella.  IMPRESSION: No acute fracture or subluxation. Mild osteoarthritic changes as described above.   Electronically Signed   By: Lahoma Crocker M.D.   On: 03/16/2015 15:09     EKG Interpretation   Date/Time:  Friday March 18 2015 03:51:47 EDT Ventricular Rate:  90 PR Interval:  197 QRS Duration: 86 QT Interval:  324 QTC Calculation: 396 R Axis:   4 Text Interpretation:  Sinus rhythm Confirmed by Keny Donald  MD, Ellia Knowlton ([id number]) on  03/18/2015 5:31:13 AM      MDM   Final diagnoses:  Fall  Thrombocytopenia due to drugs  Fever, unspecified fever cause    I personally performed the services described in this documentation, which was scribed in my presence. The recorded information has been reviewed and is accurate.  81 year old male with fall at home, unclear if mechanical versus syncope.  Found to have fever of 103.  No specific source found.  Patient with significant bruising to left eye and scalp, CT scans without intracranial bleeding.  Will discuss with hospitalist for admission.  No antibiotics at this time as we do not have a source.     Linton Flemings, MD 03/18/15 [phone]

## 2015-03-18 NOTE — ED Notes (Signed)
Admitting MD at the bedside.  

## 2015-03-18 NOTE — ED Notes (Signed)
MD at bedside. 

## 2015-03-19 DIAGNOSIS — R55 Syncope and collapse: Secondary | ICD-10-CM

## 2015-03-19 DIAGNOSIS — F0390 Unspecified dementia without behavioral disturbance: Secondary | ICD-10-CM

## 2015-03-19 DIAGNOSIS — T796XXD Traumatic ischemia of muscle, subsequent encounter: Secondary | ICD-10-CM

## 2015-03-19 DIAGNOSIS — S0093XD Contusion of unspecified part of head, subsequent encounter: Secondary | ICD-10-CM

## 2015-03-19 LAB — CBC
HEMATOCRIT: 43.9 % (ref 39.0–52.0)
Hemoglobin: 14.9 g/dL (ref 13.0–17.0)
MCH: 30.9 pg (ref 26.0–34.0)
MCHC: 33.9 g/dL (ref 30.0–36.0)
MCV: 91.1 fL (ref 78.0–100.0)
Platelets: 26 10*3/uL — CL (ref 150–400)
RBC: 4.82 MIL/uL (ref 4.22–5.81)
RDW: 13.5 % (ref 11.5–15.5)
WBC: 4 10*3/uL (ref 4.0–10.5)

## 2015-03-19 LAB — CK
CK TOTAL: 5207 U/L — AB (ref 7–232)
Total CK: 4855 U/L — ABNORMAL HIGH (ref 7–232)

## 2015-03-19 LAB — COMPREHENSIVE METABOLIC PANEL
ALT: 48 U/L (ref 0–53)
AST: 133 U/L — ABNORMAL HIGH (ref 0–37)
Albumin: 3 g/dL — ABNORMAL LOW (ref 3.5–5.2)
Alkaline Phosphatase: 58 U/L (ref 39–117)
Anion gap: 10 (ref 5–15)
BUN: 13 mg/dL (ref 6–23)
CALCIUM: 7.6 mg/dL — AB (ref 8.4–10.5)
CHLORIDE: 108 mmol/L (ref 96–112)
CO2: 22 mmol/L (ref 19–32)
CREATININE: 1.22 mg/dL (ref 0.50–1.35)
GFR calc Af Amer: 61 mL/min — ABNORMAL LOW (ref >90)
GFR, EST NON AFRICAN AMERICAN: 52 mL/min — AB (ref >90)
Glucose, Bld: 96 mg/dL (ref 70–99)
Potassium: 3.6 mmol/L (ref 3.5–5.1)
Sodium: 140 mmol/L (ref 135–145)
Total Bilirubin: 1.4 mg/dL — ABNORMAL HIGH (ref 0.3–1.2)
Total Protein: 4.9 g/dL — ABNORMAL LOW (ref 6.0–8.3)

## 2015-03-19 LAB — INFLUENZA PANEL BY PCR (TYPE A & B)
H1N1FLUPCR: NOT DETECTED
INFLAPCR: NEGATIVE
INFLBPCR: NEGATIVE

## 2015-03-19 MED ORDER — CAPSAICIN 0.025 % EX CREA
TOPICAL_CREAM | Freq: Two times a day (BID) | CUTANEOUS | Status: DC
  Filled 2015-03-19: qty 56.6

## 2015-03-19 MED ORDER — CAPSAICIN 0.075 % EX CREA
TOPICAL_CREAM | Freq: Two times a day (BID) | CUTANEOUS | Status: DC
  Administered 2015-03-19 – 2015-03-20 (×4): via TOPICAL
  Filled 2015-03-19: qty 60

## 2015-03-19 MED ORDER — DEXTROSE 5 % IV SOLN
1.0000 g | INTRAVENOUS | Status: DC
  Administered 2015-03-19: 1 g via INTRAVENOUS
  Filled 2015-03-19 (×2): qty 1

## 2015-03-19 NOTE — Progress Notes (Signed)
  Echocardiogram 2D Echocardiogram has been performed.  Jeffrey Simmons [name] 03/19/2015, 1:43 PM

## 2015-03-19 NOTE — Progress Notes (Signed)
ANTIBIOTIC CONSULT NOTE - FOLLOW UP  Pharmacy Consult:  Cefepime Indication:  Sepsis  Allergies  Allergen Reactions  . Aricept [Donepezil Hcl] Other (See Comments)    Unknown     Patient Measurements: Height: 6\' 1"  (185.4 cm) Weight: 170 lb 1.6 oz (77.157 kg) (Scale B) IBW/kg (Calculated) : 79.9  Vital Signs: Temp: 97.3 F (36.3 C) (04/23 0529) Temp Source: Oral (04/23 0529) BP: 128/58 mmHg (04/23 0529) Pulse Rate: 64 (04/23 0529) Intake/Output from previous day: 04/22 0701 - 04/23 0700 In: 10608.3 [P.O.:1020; I.V.:655; IV Piggyback:8933.3] Out: 477 [Urine:475; Stool:2] Intake/Output from this shift: Total I/O In: 120 [P.O.:120] Out: -   Labs:  Recent Labs  03/18/15 0409 03/18/15 1030 03/19/15 0534  WBC 3.9* 3.6* 4.0  HGB 16.1 15.0 14.9  PLT 32* 32* 26*  CREATININE 1.20  --  1.22   Estimated Creatinine Clearance: 48.3 mL/min (by C-G formula based on Cr of 1.22). No results for input(s): VANCOTROUGH, VANCOPEAK, VANCORANDOM, GENTTROUGH, GENTPEAK, GENTRANDOM, TOBRATROUGH, TOBRAPEAK, TOBRARND, AMIKACINPEAK, AMIKACINTROU, AMIKACIN in the last 72 hours.   Microbiology: Recent Results (from the past 720 hour(s))  Culture, blood (routine x 2)     Status: None (Preliminary result)   Collection Time: 03/18/15  4:10 AM  Result Value Ref Range Status   Specimen Description BLOOD LEFT ANTECUBITAL  Final   Special Requests BOTTLES DRAWN AEROBIC AND ANAEROBIC 5CC EA  Final   Culture   Final           BLOOD CULTURE RECEIVED NO GROWTH TO DATE CULTURE WILL BE HELD FOR 5 DAYS BEFORE ISSUING A FINAL NEGATIVE REPORT Performed at [facility]    Report Status PENDING  Incomplete  Culture, blood (routine x 2)     Status: None (Preliminary result)   Collection Time: 03/18/15  4:12 AM  Result Value Ref Range Status   Specimen Description BLOOD RIGHT ANTECUBITAL  Final   Special Requests BOTTLES DRAWN AEROBIC AND ANAEROBIC 5CC EA  Final   Culture   Final           BLOOD  CULTURE RECEIVED NO GROWTH TO DATE CULTURE WILL BE HELD FOR 5 DAYS BEFORE ISSUING A FINAL NEGATIVE REPORT Performed at [facility]    Report Status PENDING  Incomplete      Assessment: 48 YOF presented s/p fall.  Patient was initiated on vancomycin and Zosyn for sepsis.  Now to switch from Zosyn to Cefepime due to thrombocytopenia.  His renal function is stable.  Vanc 4/22 >> Zosyn 4/22 >> 4/23 Cefepime 4/23 >>  4/22 BCx x2 - NGTD   Goal of Therapy:  Vancomycin trough level 15-20 mcg/ml   Plan:  - Cefepime 1gm IV Q24H - Continue Vanc 750mg  IV Q12H - Monitor renal fxn, clinical progress, vanc trough as indicated - Watch LFTs (may need to hold namenda)    Fed Ceci D. Mina Marble, PharmD, BCPS Pager:  [phone] 03/19/2015, 10:23 AM

## 2015-03-19 NOTE — Progress Notes (Addendum)
[facility] PROGRESS NOTE  Jeffrey Simmons [id number] DOB: Dec 17, 1926 DOA: 03/18/2015 PCP: Mathews Argyle, MD  Brief summary  Jeffrey Simmons is a 81 y.o. male, with a past medical history of mantle cell lymphoma on chemotherapy, hypertension, GERD, dementia, and iatrogenic thrombocytopenia. He saw Dr. Felipa Eth on April 20 and was told he had a virus.  His wife found him down on the floor when she awoke on the morning of admission. The patient was unable to give any details due to his dementia.  He denied all pain.  In the emergency department he was found to have a large hematoma on the left side of his head with petechiae in the back of his oropharynx.   His temperature was 103, platelets 32,000, troponin 0.14, CK is 3664. CT scans of his head and cervical spine as well as x-rays of his left knee and chest were negative for abnormalities except for the hematoma.    Assessment/Plan  SIRS, probable CAP, temperature trended down, WBC stable, HR with mild bradycardia -  Blood cultures NGTD -  UA negative -  Add on urine culture  -   CXR: atelectasis -  Mild lactic acidosis:  IVF -  Continue vanc -  D/c zosyn due to thrombocytopenia -  Start cefepime -  Flu:  Neg, okay to d/c telemetry  Rhabdomyolysis, CK 3664 and rose to 5207 -  Not on statin -  Continue IVF  -  Trend CPK  Hematoma of the head with thrombocytopenia, stable -  Dr. Tyrell Antonio spoke with Dr. Alvy Bimler who recommended transfusion if platelets drop to 20,000 or below or if hematoma continues to enlarge.  Elevated troponin, stable and likely due to CKD and rhabdo -  No aspirin or heparin at this time due to risk of bleeding. -  Tele:  Sinus brady -  Okay to d/c telemetry  Mantle cell lymphoma  -  After discussion with oncology will hold chemotherapy medication.  -  Dr. Alvy Bimler is not available until Monday.  Moderate Dementia without disturbance. Continue Namenda  Hematuria, repeat in a few days, urology  f/u  GERD, stable, continue PPI  Thrombocytopenia, likely due to chemotherapy, platelets trending down slightly -  Daily platelet level and transfuse for plt < 20K or evidence of bleeding  Mild transaminitis likely due to muscle enzyme release -  Okay to continue namenda  Rash on back, shoulders, chest concerning for drug reaction -  Last dose of vancomycin was more than 12 hours ago so this is likely not Redman syndrome -  May have allergy to [place] and will add to allergy list  Diet:  Healthy heart Access:  yes IVF:  PIV Proph:  SCD  Code Status: DNR Family Communication: spoke at length with patient, his wife, and daughter Disposition Plan: pending culture data    Consultants:  Dr. Alvy Bimler by phone  Procedures:  CXR  Antibiotics:  vanc 4/22 >>  Zosyn 4/22 >> 4/23  Cefepime 4/23 >>   HPI/Subjective:  States his shoulders hurt some, and his scalp over his hematoma is itchy.  Mild cough  Objective: Filed Vitals:   03/18/15 2055 03/19/15 0130 03/19/15 0529 03/19/15 1433  BP: 128/60 124/47 128/58 116/53  Pulse: 69 56 64 67  Temp: 98.6 F (37 C) 98.5 F (36.9 C) 97.3 F (36.3 C) 97.3 F (36.3 C)  TempSrc: Oral Oral Oral Oral  Resp:  20 20 20   Height:      Weight:  77.157 kg (170 lb 1.6 oz)   SpO2: 96% 95% 96% 100%    Intake/Output Summary (Last 24 hours) at 03/19/15 1740 Last data filed at 03/19/15 1433  Gross per 24 hour  Intake  10868 ml  Output    651 ml  Net  10217 ml   Filed Weights   03/18/15 0853 03/19/15 0529  Weight: 76.2 kg (167 lb 15.9 oz) 77.157 kg (170 lb 1.6 oz)    Exam:   General:  Average weight adult male, No acute distress  HEENT:  Left scalp hematoma is fairly flat, no active bleeding, MMM  Cardiovascular:  RRR, nl S1, S2 no mrg, 2+ pulses, warm extremities  Respiratory:  CTAB, no increased WOB  Abdomen:   NABS, soft, NT/ND  MSK:   Normal tone and bulk, no LEE  Neuro:  Grossly intact  Psych:  Mildly  confused  Derm:  Full body petechia, left flank with ecchymosis, left shoulder ecchymosis appears old.  Has confluent pink erythema over his back, shoulders, chest  Data Reviewed: Basic Metabolic Panel:  Recent Labs Lab 03/18/15 0409 03/19/15 0534  NA 133* 140  K 4.2 3.6  CL 102 108  CO2 22 22  GLUCOSE 134* 96  BUN 18 13  CREATININE 1.20 1.22  CALCIUM 8.0* 7.6*   Liver Function Tests:  Recent Labs Lab 03/18/15 0409 03/19/15 0534  AST 55* 133*  ALT 35 48  ALKPHOS 67 58  BILITOT 1.4* 1.4*  PROT 5.5* 4.9*  ALBUMIN 3.5 3.0*   No results for input(s): LIPASE, AMYLASE in the last 168 hours. No results for input(s): AMMONIA in the last 168 hours. CBC:  Recent Labs Lab 03/18/15 0409 03/18/15 1030 03/19/15 0534  WBC 3.9* 3.6* 4.0  NEUTROABS 3.4 2.9  --   HGB 16.1 15.0 14.9  HCT 46.2 43.3 43.9  MCV 90.9 91.0 91.1  PLT 32* 32* 26*   Cardiac Enzymes:  Recent Labs Lab 03/18/15 0722 03/18/15 1344 03/18/15 1925 03/19/15 0534 03/19/15 1127  CKTOTAL 3664*  --   --  5207* 4855*  TROPONINI 0.14* 0.12* 0.13*  --   --    BNP (last 3 results) No results for input(s): BNP in the last 8760 hours.  ProBNP (last 3 results) No results for input(s): PROBNP in the last 8760 hours.  CBG: No results for input(s): GLUCAP in the last 168 hours.  Recent Results (from the past 240 hour(s))  Culture, blood (routine x 2)     Status: None (Preliminary result)   Collection Time: 03/18/15  4:10 AM  Result Value Ref Range Status   Specimen Description BLOOD LEFT ANTECUBITAL  Final   Special Requests BOTTLES DRAWN AEROBIC AND ANAEROBIC 5CC EA  Final   Culture   Final           BLOOD CULTURE RECEIVED NO GROWTH TO DATE CULTURE WILL BE HELD FOR 5 DAYS BEFORE ISSUING A FINAL NEGATIVE REPORT Performed at [facility]    Report Status PENDING  Incomplete  Culture, blood (routine x 2)     Status: None (Preliminary result)   Collection Time: 03/18/15  4:12 AM  Result Value  Ref Range Status   Specimen Description BLOOD RIGHT ANTECUBITAL  Final   Special Requests BOTTLES DRAWN AEROBIC AND ANAEROBIC 5CC EA  Final   Culture   Final           BLOOD CULTURE RECEIVED NO GROWTH TO DATE CULTURE WILL BE HELD FOR 5 DAYS BEFORE ISSUING A  FINAL NEGATIVE REPORT Performed at [facility]    Report Status PENDING  Incomplete     Studies: Ct Head Wo Contrast  03/18/2015   CLINICAL DATA:  Fall in bathroom.  Hit right side of head.  EXAM: CT HEAD WITHOUT CONTRAST  CT CERVICAL SPINE WITHOUT CONTRAST  TECHNIQUE: Multidetector CT imaging of the head and cervical spine was performed following the standard protocol without intravenous contrast. Multiplanar CT image reconstructions of the cervical spine were also generated.  COMPARISON:  None.  FINDINGS: CT HEAD FINDINGS  Soft tissue swelling in the left side of the forehead. No underlying calvarial fracture. There is atrophy and chronic small vessel disease changes. No acute intracranial abnormality. Specifically, no hemorrhage, hydrocephalus, mass lesion, acute infarction, or significant intracranial injury. No acute calvarial abnormality.  CT CERVICAL SPINE FINDINGS  Mild degenerative disc disease changes at C5-6 and C6-7 with disc space narrowing and spurring. Mild diffuse degenerative facet disease. Normal alignment. Prevertebral soft tissues are normal. No fracture. No epidural or paraspinal hematoma.  IMPRESSION: No acute intracranial abnormality.  Spondylosis.  No acute bony abnormality in the cervical spine.   Electronically Signed   By: Rolm Baptise M.D.   On: 03/18/2015 06:09   Ct Cervical Spine Wo Contrast  03/18/2015   CLINICAL DATA:  Fall in bathroom.  Hit right side of head.  EXAM: CT HEAD WITHOUT CONTRAST  CT CERVICAL SPINE WITHOUT CONTRAST  TECHNIQUE: Multidetector CT imaging of the head and cervical spine was performed following the standard protocol without intravenous contrast. Multiplanar CT image reconstructions of  the cervical spine were also generated.  COMPARISON:  None.  FINDINGS: CT HEAD FINDINGS  Soft tissue swelling in the left side of the forehead. No underlying calvarial fracture. There is atrophy and chronic small vessel disease changes. No acute intracranial abnormality. Specifically, no hemorrhage, hydrocephalus, mass lesion, acute infarction, or significant intracranial injury. No acute calvarial abnormality.  CT CERVICAL SPINE FINDINGS  Mild degenerative disc disease changes at C5-6 and C6-7 with disc space narrowing and spurring. Mild diffuse degenerative facet disease. Normal alignment. Prevertebral soft tissues are normal. No fracture. No epidural or paraspinal hematoma.  IMPRESSION: No acute intracranial abnormality.  Spondylosis.  No acute bony abnormality in the cervical spine.   Electronically Signed   By: Rolm Baptise M.D.   On: 03/18/2015 06:09   Dg Chest Port 1 View  (if Code Sepsis Called)  03/18/2015   CLINICAL DATA:  Found on floor. Hematoma to left forehead. Abrasions to left chest.  EXAM: PORTABLE CHEST - 1 VIEW  COMPARISON:  Chest CT 11/01/2014  FINDINGS: Minimal bibasilar atelectasis. Heart is normal size. No effusions. No pneumothorax. No acute bony abnormality.  IMPRESSION: Bibasilar atelectasis.   Electronically Signed   By: Rolm Baptise M.D.   On: 03/18/2015 04:15   Dg Shoulder Left Port  03/18/2015   CLINICAL DATA:  Recent fall with left shoulder pain, initial encounter  EXAM: LEFT SHOULDER - 1 VIEW  COMPARISON:  None.  FINDINGS: Mild degenerative changes of the acromioclavicular joint are seen. No acute fracture or dislocation is noted. No gross soft tissue abnormality is seen.  IMPRESSION: No acute abnormality noted.   Electronically Signed   By: Inez Catalina M.D.   On: 03/18/2015 09:21    Scheduled Meds: . capsicum   Topical BID  . ceFEPime (MAXIPIME) IV  1 g Intravenous Q24H  . docusate sodium  100 mg Oral BID  . memantine  28 mg Oral Daily  .  multivitamin-lutein  1 capsule  Oral BID  . pantoprazole  40 mg Oral BID  . sodium chloride  3 mL Intravenous Q12H  . vancomycin  750 mg Intravenous Q12H   Continuous Infusions: . sodium chloride 100 mL/hr at 03/19/15 0222    Principal Problem:   Sepsis Active Problems:   Mantle cell lymphoma of intra-abdominal lymph nodes   Moderate dementia without behavioral disturbance   Thrombocytopenia due to drugs   Fall at home   Traumatic hematoma of head   Elevated troponin   Rhabdomyolysis    Time spent: 30 min    Ghali Morissette, [facility]  [facility] Pager [phone]. If 7PM-7AM, please contact night-coverage at [email or web address], password [facility] 03/19/2015, 5:40 PM  LOS: 1 day

## 2015-03-20 DIAGNOSIS — W19XXXD Unspecified fall, subsequent encounter: Secondary | ICD-10-CM

## 2015-03-20 LAB — CBC
HCT: 41.2 % (ref 39.0–52.0)
HEMOGLOBIN: 14.2 g/dL (ref 13.0–17.0)
MCH: 31.2 pg (ref 26.0–34.0)
MCHC: 34.5 g/dL (ref 30.0–36.0)
MCV: 90.5 fL (ref 78.0–100.0)
Platelets: 32 10*3/uL — ABNORMAL LOW (ref 150–400)
RBC: 4.55 MIL/uL (ref 4.22–5.81)
RDW: 13.5 % (ref 11.5–15.5)
WBC: 3.8 10*3/uL — ABNORMAL LOW (ref 4.0–10.5)

## 2015-03-20 LAB — BASIC METABOLIC PANEL
Anion gap: 10 (ref 5–15)
BUN: 7 mg/dL (ref 6–23)
CO2: 21 mmol/L (ref 19–32)
Calcium: 7.2 mg/dL — ABNORMAL LOW (ref 8.4–10.5)
Chloride: 109 mmol/L (ref 96–112)
Creatinine, Ser: 0.93 mg/dL (ref 0.50–1.35)
GFR calc Af Amer: 86 mL/min — ABNORMAL LOW (ref >90)
GFR calc non Af Amer: 74 mL/min — ABNORMAL LOW (ref >90)
Glucose, Bld: 97 mg/dL (ref 70–99)
Potassium: 3.2 mmol/L — ABNORMAL LOW (ref 3.5–5.1)
Sodium: 140 mmol/L (ref 135–145)

## 2015-03-20 LAB — CK: Total CK: 2225 U/L — ABNORMAL HIGH (ref 7–232)

## 2015-03-20 MED ORDER — POTASSIUM CHLORIDE CRYS ER 20 MEQ PO TBCR
40.0000 meq | EXTENDED_RELEASE_TABLET | Freq: Once | ORAL | Status: AC
  Administered 2015-03-20: 40 meq via ORAL
  Filled 2015-03-20: qty 2

## 2015-03-20 MED ORDER — DEXTROSE 5 % IV SOLN
1.0000 g | Freq: Three times a day (TID) | INTRAVENOUS | Status: DC
  Administered 2015-03-20 – 2015-03-21 (×3): 1 g via INTRAVENOUS
  Filled 2015-03-20 (×5): qty 1

## 2015-03-20 NOTE — Progress Notes (Signed)
ANTIBIOTIC CONSULT NOTE - FOLLOW UP  Pharmacy Consult:  Cefepime Indication:  Sepsis  Allergies  Allergen Reactions  . Aricept [Donepezil Hcl] Other (See Comments)    Unknown   . Zosyn [Piperacillin Sod-Tazobactam So] Rash    Pink macular rash on back, chest, shoulders    Patient Measurements: Height: 6\' 1"  (185.4 cm) Weight: 170 lb 11.2 oz (77.429 kg) (scale B) IBW/kg (Calculated) : 79.9  Vital Signs: Temp: 98.5 F (36.9 C) (04/24 0443) Temp Source: Oral (04/24 0443) BP: 140/55 mmHg (04/24 0443) Pulse Rate: 77 (04/24 0443) Intake/Output from previous day: 04/23 0701 - 04/24 0700 In: 3419.7 [P.O.:600; I.V.:2469.7; IV Piggyback:350] Out: 1025 [Urine:1025] Intake/Output from this shift: Total I/O In: 180 [P.O.:180] Out: 120 [Urine:120]  Labs:  Recent Labs  03/18/15 0409 03/18/15 1030 03/19/15 0534 03/20/15 0708  WBC 3.9* 3.6* 4.0 3.8*  HGB 16.1 15.0 14.9 14.2  PLT 32* 32* 26* 32*  CREATININE 1.20  --  1.22 0.93   Estimated Creatinine Clearance: 63.6 mL/min (by C-G formula based on Cr of 0.93). No results for input(s): VANCOTROUGH, VANCOPEAK, VANCORANDOM, GENTTROUGH, GENTPEAK, GENTRANDOM, TOBRATROUGH, TOBRAPEAK, TOBRARND, AMIKACINPEAK, AMIKACINTROU, AMIKACIN in the last 72 hours.   Microbiology: Recent Results (from the past 720 hour(s))  Culture, blood (routine x 2)     Status: None (Preliminary result)   Collection Time: 03/18/15  4:10 AM  Result Value Ref Range Status   Specimen Description BLOOD LEFT ANTECUBITAL  Final   Special Requests BOTTLES DRAWN AEROBIC AND ANAEROBIC 5CC EA  Final   Culture   Final           BLOOD CULTURE RECEIVED NO GROWTH TO DATE CULTURE WILL BE HELD FOR 5 DAYS BEFORE ISSUING A FINAL NEGATIVE REPORT Performed at [facility]    Report Status PENDING  Incomplete  Culture, blood (routine x 2)     Status: None (Preliminary result)   Collection Time: 03/18/15  4:12 AM  Result Value Ref Range Status   Specimen Description  BLOOD RIGHT ANTECUBITAL  Final   Special Requests BOTTLES DRAWN AEROBIC AND ANAEROBIC 5CC EA  Final   Culture   Final           BLOOD CULTURE RECEIVED NO GROWTH TO DATE CULTURE WILL BE HELD FOR 5 DAYS BEFORE ISSUING A FINAL NEGATIVE REPORT Performed at [facility]    Report Status PENDING  Incomplete      Assessment: 55 YOF presented s/p fall to continue on vancomycin and cefepime for sepsis.  His renal function is improving.  Vanc 4/22 >> Zosyn 4/22 >> 4/23 (d/c'ed d/t macular rash) Cefepime 4/23 >>  4/22 BCx x2 - NGTD   Goal of Therapy:  Vancomycin trough level 15-20 mcg/ml   Plan:  - Change cefepime to 1gm IV Q8H - Continue Vanc 750mg  IV Q12H - Monitor renal fxn, clinical progress, vanc trough soon    Jahnavi Muratore D. Mina Marble, PharmD, BCPS Pager:  [phone] 03/20/2015, 9:25 AM

## 2015-03-20 NOTE — Progress Notes (Signed)
[facility] PROGRESS NOTE  Jeffrey Simmons [id number] DOB: Apr 04, 1927 DOA: 03/18/2015 PCP: Mathews Argyle, MD  Brief summary  Jeffrey Simmons is a 81 y.o. male, with a past medical history of mantle cell lymphoma on chemotherapy, hypertension, GERD, dementia, and iatrogenic thrombocytopenia. He saw Dr. Felipa Eth on April 20 and was told he had a virus.  His wife found him down on the floor when she awoke on the morning of admission. The patient was unable to give any details due to his dementia.  He denied all pain.  In the emergency department he was found to have a large hematoma on the left side of his head with petechiae in the back of his oropharynx.   His temperature was 103, platelets 32,000, troponin 0.14, CK is 3664. CT scans of his head and cervical spine as well as x-rays of his left knee and chest were negative for abnormalities except for the hematoma.    Assessment/Plan  Sepsis from possible CAP, temperature trended down, WBC stable, HR with mild bradycardia.  Atelectasis on CXR and cough suggestive of possible pneumonia.   -  Blood cultures NGTD -  UA negative -  CXR: atelectasis  -  Mild lactic acidosis:  IVF -  D/c vanc since blood cultures negative -  D/c'd zosyn due to thrombocytopenia and started cefepime on 4/23 -  Continue cefepime -  Flu:  Neg, okay to d/c droplet  Fall, likely related to possible pneumonia/infection -  PT/OT   Rhabdomyolysis, CK 3664 and rose to 5207 but trending down -  Not on statin -  Continue IVF  -  Repeat CPK  Hematoma of the head with thrombocytopenia, stable -  Dr. Tyrell Antonio spoke with Dr. Alvy Bimler who recommended transfusion if platelets drop to 20,000 or below or if hematoma continues to enlarge.  Elevated troponin, stable and likely due to CKD and rhabdo -  No aspirin or heparin at this time due to risk of bleeding. -  Tele:  Sinus brady -  Okay to d/c telemetry  Mantle cell lymphoma  -  After discussion with oncology  will hold chemotherapy medication.  -  Dr. Alvy Bimler is not available until Monday.  Moderate Dementia without disturbance. -  Continue Namenda  Hematuria, repeat in a few days, urology f/u  GERD, stable, continue PPI  Thrombocytopenia, likely due to chemotherapy, platelets trending back up -  Daily platelet level and transfuse for plt < 20K or evidence of bleeding  Mild transaminitis likely due to muscle enzyme release -  Okay to continue namenda -  Repeat LFTs in AM  Rash on back, shoulders, chest concerning for drug reaction -  Last dose of vancomycin was more than 12 hours ago so this is likely not Redman syndrome -  May have allergy to [place] and will add to allergy list  Diet:  Healthy heart Access:  yes IVF:  off Proph:  SCD  Code Status: DNR Family Communication: spoke at length with patient and his wife by phone Disposition Plan:  Anticipate d/c tomorrow after PT/OT assessments and further improvement in CPK, repeat LFTs     Consultants:  Dr. Alvy Bimler by phone  Procedures:  CXR  Antibiotics:  vanc 4/22 >> 4/24  Zosyn 4/22 >> 4/23  Cefepime 4/23 >>   HPI/Subjective:  States he feels well.  Denies pain, SOB.  Has mild cough still.    Objective: Filed Vitals:   03/19/15 1433 03/19/15 2100 03/20/15 0200 03/20/15 0443  BP:  116/53 122/61 160/64 140/55  Pulse: 67 66 73 77  Temp: 97.3 F (36.3 C) 97.5 F (36.4 C) 97.7 F (36.5 C) 98.5 F (36.9 C)  TempSrc: Oral Oral Oral Oral  Resp: 20 20 20 18   Height:      Weight:    77.429 kg (170 lb 11.2 oz)  SpO2: 100% 97% 100% 100%    Intake/Output Summary (Last 24 hours) at 03/20/15 1359 Last data filed at 03/20/15 1354  Gross per 24 hour  Intake 3116.67 ml  Output    895 ml  Net 2221.67 ml   Filed Weights   03/18/15 0853 03/19/15 0529 03/20/15 0443  Weight: 76.2 kg (167 lb 15.9 oz) 77.157 kg (170 lb 1.6 oz) 77.429 kg (170 lb 11.2 oz)    Exam:   General:  Average weight adult male,  No acute distress  HEENT:  Left scalp hematoma is fairly flat, no active bleeding, MMM  Cardiovascular:  RRR, nl S1, S2 no mrg, 2+ pulses, warm extremities  Respiratory:  CTAB, no increased WOB  Abdomen:   NABS, soft, NT/ND  MSK:   Normal tone and bulk, no LEE  Neuro:  Grossly intact  Psych:  Mildly confused  Derm:  Full body petechia, persistent pink erythema over his back, shoulders, chest  Data Reviewed: Basic Metabolic Panel:  Recent Labs Lab 03/18/15 0409 03/19/15 0534 03/20/15 0708  NA 133* 140 140  K 4.2 3.6 3.2*  CL 102 108 109  CO2 22 22 21   GLUCOSE 134* 96 97  BUN 18 13 7   CREATININE 1.20 1.22 0.93  CALCIUM 8.0* 7.6* 7.2*   Liver Function Tests:  Recent Labs Lab 03/18/15 0409 03/19/15 0534  AST 55* 133*  ALT 35 48  ALKPHOS 67 58  BILITOT 1.4* 1.4*  PROT 5.5* 4.9*  ALBUMIN 3.5 3.0*   No results for input(s): LIPASE, AMYLASE in the last 168 hours. No results for input(s): AMMONIA in the last 168 hours. CBC:  Recent Labs Lab 03/18/15 0409 03/18/15 1030 03/19/15 0534 03/20/15 0708  WBC 3.9* 3.6* 4.0 3.8*  NEUTROABS 3.4 2.9  --   --   HGB 16.1 15.0 14.9 14.2  HCT 46.2 43.3 43.9 41.2  MCV 90.9 91.0 91.1 90.5  PLT 32* 32* 26* 32*   Cardiac Enzymes:  Recent Labs Lab 03/18/15 0722 03/18/15 1344 03/18/15 1925 03/19/15 0534 03/19/15 1127 03/20/15 0708  CKTOTAL 3664*  --   --  5207* 4855* 2225*  TROPONINI 0.14* 0.12* 0.13*  --   --   --    BNP (last 3 results) No results for input(s): BNP in the last 8760 hours.  ProBNP (last 3 results) No results for input(s): PROBNP in the last 8760 hours.  CBG: No results for input(s): GLUCAP in the last 168 hours.  Recent Results (from the past 240 hour(s))  Culture, blood (routine x 2)     Status: None (Preliminary result)   Collection Time: 03/18/15  4:10 AM  Result Value Ref Range Status   Specimen Description BLOOD LEFT ANTECUBITAL  Final   Special Requests BOTTLES DRAWN AEROBIC AND  ANAEROBIC 5CC EA  Final   Culture   Final           BLOOD CULTURE RECEIVED NO GROWTH TO DATE CULTURE WILL BE HELD FOR 5 DAYS BEFORE ISSUING A FINAL NEGATIVE REPORT Performed at [facility]    Report Status PENDING  Incomplete  Culture, blood (routine x 2)     Status: None (  Preliminary result)   Collection Time: 03/18/15  4:12 AM  Result Value Ref Range Status   Specimen Description BLOOD RIGHT ANTECUBITAL  Final   Special Requests BOTTLES DRAWN AEROBIC AND ANAEROBIC 5CC EA  Final   Culture   Final           BLOOD CULTURE RECEIVED NO GROWTH TO DATE CULTURE WILL BE HELD FOR 5 DAYS BEFORE ISSUING A FINAL NEGATIVE REPORT Performed at [facility]    Report Status PENDING  Incomplete     Studies: No results found.  Scheduled Meds: . capsicum   Topical BID  . ceFEPime (MAXIPIME) IV  1 g Intravenous 3 times per day  . docusate sodium  100 mg Oral BID  . memantine  28 mg Oral Daily  . multivitamin-lutein  1 capsule Oral BID  . pantoprazole  40 mg Oral BID  . potassium chloride  40 mEq Oral Once  . sodium chloride  3 mL Intravenous Q12H  . vancomycin  750 mg Intravenous Q12H   Continuous Infusions: . sodium chloride 100 mL/hr at 03/20/15 [id number]    Principal Problem:   Sepsis Active Problems:   Mantle cell lymphoma of intra-abdominal lymph nodes   Moderate dementia without behavioral disturbance   Thrombocytopenia due to drugs   Fall at home   Traumatic hematoma of head   Elevated troponin   Rhabdomyolysis    Time spent: 30 min    Shelley Pooley, [facility]  [facility] Pager [phone]. If 7PM-7AM, please contact night-coverage at [email or web address], password [facility] 03/20/2015, 1:59 PM  LOS: 2 days

## 2015-03-21 ENCOUNTER — Telehealth: Payer: Self-pay | Admitting: Hematology and Oncology

## 2015-03-21 DIAGNOSIS — J189 Pneumonia, unspecified organism: Secondary | ICD-10-CM

## 2015-03-21 LAB — COMPREHENSIVE METABOLIC PANEL
ALBUMIN: 2.7 g/dL — AB (ref 3.5–5.2)
ALT: 66 U/L — AB (ref 0–53)
ANION GAP: 10 (ref 5–15)
AST: 84 U/L — ABNORMAL HIGH (ref 0–37)
Alkaline Phosphatase: 57 U/L (ref 39–117)
BILIRUBIN TOTAL: 1.4 mg/dL — AB (ref 0.3–1.2)
BUN: 9 mg/dL (ref 6–23)
CHLORIDE: 111 mmol/L (ref 96–112)
CO2: 21 mmol/L (ref 19–32)
CREATININE: 1.32 mg/dL (ref 0.50–1.35)
Calcium: 7.3 mg/dL — ABNORMAL LOW (ref 8.4–10.5)
GFR calc Af Amer: 55 mL/min — ABNORMAL LOW (ref >90)
GFR, EST NON AFRICAN AMERICAN: 47 mL/min — AB (ref >90)
GLUCOSE: 97 mg/dL (ref 70–99)
Potassium: 3.4 mmol/L — ABNORMAL LOW (ref 3.5–5.1)
Sodium: 142 mmol/L (ref 135–145)
Total Protein: 5 g/dL — ABNORMAL LOW (ref 6.0–8.3)

## 2015-03-21 LAB — CBC
HEMATOCRIT: 39.4 % (ref 39.0–52.0)
HEMOGLOBIN: 13.6 g/dL (ref 13.0–17.0)
MCH: 31.1 pg (ref 26.0–34.0)
MCHC: 34.5 g/dL (ref 30.0–36.0)
MCV: 90 fL (ref 78.0–100.0)
Platelets: 42 10*3/uL — ABNORMAL LOW (ref 150–400)
RBC: 4.38 MIL/uL (ref 4.22–5.81)
RDW: 13.5 % (ref 11.5–15.5)
WBC: 3.7 10*3/uL — ABNORMAL LOW (ref 4.0–10.5)

## 2015-03-21 LAB — CK: CK TOTAL: 1126 U/L — AB (ref 7–232)

## 2015-03-21 MED ORDER — POTASSIUM CHLORIDE CRYS ER 20 MEQ PO TBCR: 20.0000 meq | EXTENDED_RELEASE_TABLET | Freq: Every day | ORAL | Status: DC

## 2015-03-21 MED ORDER — LEVOFLOXACIN 750 MG PO TABS: 750.0000 mg | ORAL_TABLET | ORAL | Status: DC

## 2015-03-21 MED ORDER — DEXTROSE 5 % IV SOLN
1.0000 g | Freq: Two times a day (BID) | INTRAVENOUS | Status: DC
  Filled 2015-03-21 (×2): qty 1

## 2015-03-21 MED ORDER — POTASSIUM CHLORIDE CRYS ER 20 MEQ PO TBCR
40.0000 meq | EXTENDED_RELEASE_TABLET | Freq: Once | ORAL | Status: AC
  Administered 2015-03-21: 40 meq via ORAL
  Filled 2015-03-21: qty 2

## 2015-03-21 MED ORDER — POTASSIUM CHLORIDE CRYS ER 20 MEQ PO TBCR
20.0000 meq | EXTENDED_RELEASE_TABLET | Freq: Every day | ORAL | Status: DC
Start: 2015-03-21 — End: 2015-03-30

## 2015-03-21 NOTE — Progress Notes (Signed)
Pt had a NSL to left AC present on arrival this am, at this time 0934--noted extention tubing had disconnected from the cath hub.  Site d/c'd at this time, dsg applied. Site clean, dry & intact.

## 2015-03-21 NOTE — Evaluation (Signed)
Physical Therapy Evaluation Patient Details Name: Jeffrey Simmons MRN: [id number] DOB: [birth year] Today's Date: 03/21/2015   History of Present Illness  Jeffrey Simmons is a 81 y.o. male, with a past medical history of mantle cell lymphoma on chemotherapy, hypertension, GERD, dementia, and iatrogenic thrombocytopenia. His wife found him down on the floor pt with sepsis, CAP, hematoma to his head but no further injury  Clinical Impression  Jeffrey Simmons is very pleasant and telling stories of living in [place] and [place]. Pt and wife report no additional falls but decreased speed and stability with gait lately. Pt much improved with use of rW and recommend for home and all gait. Pt with decreased balance and gait who will benefit from acute therapy to maximize mobility, function and safety to decrease burden of care.     Follow Up Recommendations Home health PT    Equipment Recommendations  Rolling walker with 5" wheels    Recommendations for Other Services       Precautions / Restrictions Precautions Precautions: Fall      Mobility  Bed Mobility Overal bed mobility: Modified Independent                Transfers Overall transfer level: Needs assistance   Transfers: Sit to/from Stand Sit to Stand: Supervision         General transfer comment: cues for hand placement and safety  Ambulation/Gait Ambulation/Gait assistance: Supervision Ambulation Distance (Feet): 400 Feet Assistive device: Rolling walker (2 wheeled) Gait Pattern/deviations: Step-through pattern;Decreased stride length;Trunk flexed   Gait velocity interpretation: Below normal speed for age/gender General Gait Details: cues for posture, position in rW and looking up. Pt initiated gait without DME but very short shuffle steps, increased stride, speed and confidence with use of rW  Stairs            Wheelchair Mobility    Modified Rankin (Stroke Patients Only)       Balance Overall balance  assessment: Needs assistance   Sitting balance-Leahy Scale: Good       Standing balance-Leahy Scale: Fair Standing balance comment: 2 min static standing without LOB                             Pertinent Vitals/Pain Pain Assessment: No/denies pain    Home Living Family/patient expects to be discharged to:: Private residence (independent living at [facility] with wife) Living Arrangements: Alone Available Help at Discharge: Family;Available 24 hours/day Type of Home: Apartment Home Access: Level entry     Home Layout: One level Home Equipment: Cane - single point;Shower seat - built in      Prior Function Level of Independence: Independent         Comments: pt normally walks the dog, stands to shower and cares for himself with ADLs. Wife states she has noted decreased speed of gait lately     Hand Dominance        Extremity/Trunk Assessment   Upper Extremity Assessment: Overall WFL for tasks assessed           Lower Extremity Assessment: Overall WFL for tasks assessed      Cervical / Trunk Assessment: Kyphotic  Communication   Communication: No difficulties  Cognition Arousal/Alertness: Awake/alert Behavior During Therapy: WFL for tasks assessed/performed Overall Cognitive Status: History of cognitive impairments - at baseline       Memory: Decreased short-term memory              General  Comments      Exercises        Assessment/Plan    PT Assessment Patient needs continued PT services  PT Diagnosis Difficulty walking;Altered mental status   PT Problem List Decreased activity tolerance;Decreased balance;Decreased knowledge of use of DME;Decreased cognition  PT Treatment Interventions Gait training;Functional mobility training;Therapeutic activities;Therapeutic exercise;Balance training;Patient/family education;DME instruction   PT Goals (Current goals can be found in the Care Plan section) Acute Rehab PT Goals Patient  Stated Goal: return home and walk the dog PT Goal Formulation: With patient/family Time For Goal Achievement: 04/04/15 Potential to Achieve Goals: Good    Frequency Min 3X/week   Barriers to discharge        Co-evaluation               End of Session   Activity Tolerance: Patient tolerated treatment well Patient left: in chair;with call bell/phone within reach;with family/visitor present Nurse Communication: Mobility status;Precautions         Time: [id number] PT Time Calculation (min) (ACUTE ONLY): 20 min   Charges:   PT Evaluation $Initial PT Evaluation Tier I: 1 Procedure     PT G CodesMelford Aase 03/21/2015, 11:13 AM Elwyn Reach, [place]

## 2015-03-21 NOTE — Evaluation (Signed)
Occupational Therapy Evaluation and Discharge Patient Details Name: Jeffrey Simmons MRN: [id number] DOB: 05-16-1927 Today's Date: 03/21/2015    History of Present Illness Jeffrey Simmons is a 81 y.o. male, with a past medical history of mantle cell lymphoma on chemotherapy, hypertension, GERD, dementia, and iatrogenic thrombocytopenia. His wife found him down on the floor pt with sepsis, CAP, hematoma to his head but no further injury   Clinical Impression   This 81 yo male admitted with above presents to acute OT at a S level and he has this at home. No further OT needs, we will sign off.    Follow Up Recommendations  No OT follow up    Equipment Recommendations  None recommended by OT       Precautions / Restrictions Precautions Precautions: Fall Restrictions Weight Bearing Restrictions: No      Mobility Bed Mobility Overal bed mobility: Modified Independent             General bed mobility comments: Pt up in recliner upon arrival  Transfers Overall transfer level: Needs assistance Equipment used: None Transfers: Sit to/from Stand Sit to Stand: Supervision         General transfer comment: ambulated in room x2 with min guard A and no RW due to space constraints and to see how he ambulated with his shoes on--no shuffling gate with shoes on, but pt still wanting to hold onto furniture at times as he was ambulating around the room    Balance Overall balance assessment: Needs assistance Sitting-balance support: No upper extremity supported;Feet supported Sitting balance-Leahy Scale: Good     Standing balance support: No upper extremity supported Standing balance-Leahy Scale: Fair Standing balance comment: 2 min static standing without LOB                            ADL Overall ADL's : Modified independent                                       General ADL Comments: Increased time due to soreness. Did recommend that pt use his built  in shower seat at home for safety purposes at least intially due to his recent fall     Vision Additional Comments: No change from baseline          Pertinent Vitals/Pain Pain Assessment: Faces Faces Pain Scale: Hurts a little bit Pain Location: left side of head and back across his shoulder blades Pain Descriptors / Indicators: Aching;Sore Pain Intervention(s): Monitored during session (explained the best thing he could do is move and maybe try heat)     Hand Dominance Right   Extremity/Trunk Assessment Upper Extremity Assessment Upper Extremity Assessment: Overall WFL for tasks assessed (just sore from fall)   Lower Extremity Assessment Lower Extremity Assessment: Overall WFL for tasks assessed   Cervical / Trunk Assessment Cervical / Trunk Assessment: Kyphotic   Communication Communication Communication: No difficulties   Cognition Arousal/Alertness: Awake/alert Behavior During Therapy: WFL for tasks assessed/performed Overall Cognitive Status: History of cognitive impairments - at baseline       Memory: Decreased short-term memory                        Home Living Family/patient expects to be discharged to:: Private residence Living Arrangements: Spouse/significant other Available Help at Discharge: Family;Available 24 hours/day Type  of Home: Apartment Home Access: Level entry     Home Layout: One level     Bathroom Shower/Tub: Tub/shower unit;Curtain Shower/tub characteristics: Architectural technologist: Handicapped height     Home Equipment: [place] - single point;Shower seat - built in;Hand held shower head          Prior Functioning/Environment Level of Independence: Independent        Comments: pt normally walks the dog, stands to shower and cares for himself with ADLs. Wife states she has noted decreased speed of gait lately    OT Diagnosis: Generalized weakness         OT Goals(Current goals can be found in the care plan section)  Acute Rehab OT Goals Patient Stated Goal: to go home today  OT Frequency:                End of Session    Activity Tolerance: Patient tolerated treatment well Patient left: in chair;with call bell/phone within reach;with nursing/sitter in room   Time: [id number] OT Time Calculation (min): 15 min Charges:  OT General Charges $OT Visit: 1 Procedure OT Evaluation $Initial OT Evaluation Tier I: 1 Procedure  Almon Register [id number] 03/21/2015, 2:01 PM

## 2015-03-21 NOTE — Progress Notes (Signed)
ANTIBIOTIC CONSULT NOTE - FOLLOW UP  Pharmacy Consult:  Cefepime Indication:  Sepsis  Allergies  Allergen Reactions  . Aricept [Donepezil Hcl] Other (See Comments)    Unknown   . Zosyn [Piperacillin Sod-Tazobactam So] Rash    Pink macular rash on back, chest, shoulders    Patient Measurements: Height: 6\' 1"  (185.4 cm) Weight: 171 lb 15.3 oz (78 kg) IBW/kg (Calculated) : 79.9  Vital Signs: Temp: 98.5 F (36.9 C) (04/25 0412) Temp Source: Oral (04/25 0412) BP: 139/71 mmHg (04/25 0412) Pulse Rate: 68 (04/25 0412) Intake/Output from previous day: 04/24 0701 - 04/25 0700 In: 870 [P.O.:720; IV Piggyback:150] Out: 1145 [Urine:1145] Intake/Output from this shift: Total I/O In: 200 [P.O.:200] Out: 100 [Urine:100]  Labs:  Recent Labs  03/19/15 0534 03/20/15 0708 03/21/15 0445  WBC 4.0 3.8* 3.7*  HGB 14.9 14.2 13.6  PLT 26* 32* 42*  CREATININE 1.22 0.93 1.32   Estimated Creatinine Clearance: 45.1 mL/min (by C-G formula based on Cr of 1.32). No results for input(s): VANCOTROUGH, VANCOPEAK, VANCORANDOM, GENTTROUGH, GENTPEAK, GENTRANDOM, TOBRATROUGH, TOBRAPEAK, TOBRARND, AMIKACINPEAK, AMIKACINTROU, AMIKACIN in the last 72 hours.   Microbiology: Recent Results (from the past 720 hour(s))  Culture, blood (routine x 2)     Status: None (Preliminary result)   Collection Time: 03/18/15  4:10 AM  Result Value Ref Range Status   Specimen Description BLOOD LEFT ANTECUBITAL  Final   Special Requests BOTTLES DRAWN AEROBIC AND ANAEROBIC 5CC EA  Final   Culture   Final           BLOOD CULTURE RECEIVED NO GROWTH TO DATE CULTURE WILL BE HELD FOR 5 DAYS BEFORE ISSUING A FINAL NEGATIVE REPORT Performed at [facility]    Report Status PENDING  Incomplete  Culture, blood (routine x 2)     Status: None (Preliminary result)   Collection Time: 03/18/15  4:12 AM  Result Value Ref Range Status   Specimen Description BLOOD RIGHT ANTECUBITAL  Final   Special Requests BOTTLES DRAWN  AEROBIC AND ANAEROBIC 5CC EA  Final   Culture   Final           BLOOD CULTURE RECEIVED NO GROWTH TO DATE CULTURE WILL BE HELD FOR 5 DAYS BEFORE ISSUING A FINAL NEGATIVE REPORT Performed at [facility]    Report Status PENDING  Incomplete    Assessment: 6 YOF presented s/p fall who continues on cefepime for suspected PNA. He has history of mantle cell lymphoma and was on chemo PTA. WBC 3.7, afebrile.  SCr yesterday was down to 0.93, but today it is back up to 1.3. Today's value is more consistent with patient's SCr from the past year.  Vancomycin 4/22>>4/24 (blood cultures NGTD) Zosyn 4/22 >> 4/23 (d/c'ed d/t macular rash) Cefepime 4/23 >>  4/22 BCx x2 - NGTD  Goal of Therapy:  Proper antibiotic dosing based on renal and hepatic function  Plan:  - Change cefepime to 1g IV q12h- renal function is not high enough to warrant q8 dosing, but will be more aggressive than usual with chemo history - Monitor renal fxn, clinical progress   Arelly Whittenberg D. Jackelyne Sayer, PharmD, BCPS Clinical Pharmacist Pager: [phone] 03/21/2015 8:54 AM

## 2015-03-21 NOTE — Progress Notes (Addendum)
CSW met with patient and his wife Connie this morning. Patient resides at [facility] with is wife- they live in an apartment.  Physical Therapy has assessed patient and recommend return back to [facility] with Home Health and a rolling walker. RNCM- Alecia was notified of above.  Notified Kelly at [facility] of above. Also spoke to Scott in the Independent Living unit- He stated that he would set up patient's Physical Therapy after he speaks to patient's wife about care choices.  He will notify this CSW of choice to relay to RNCM to set up RW walker.  No further CSW needs identified and will sign off.   T. , LCSW [phone]    

## 2015-03-21 NOTE — Discharge Summary (Addendum)
Physician Discharge Summary  Jeffrey Simmons [id number] DOB: Feb 11, 1927 DOA: 03/18/2015  PCP: Mathews Argyle, MD  Admit date: 03/18/2015 Discharge date: 03/21/2015  Recommendations for Outpatient Follow-up:  1. Continue levofloxacin to complete a 7-day course of antibiotics  2. F/u already arranged with PCP.  Please repeat BMP and CBC to f/u leukopenia, thrombocytopenia, and hypokalemia.  Please repeat UA and if he has persistent hematuria, consider referral to urology.  Repeat LFTs and CK to ensure resolution.   3. Asked him to stop his aspirin until he followed up with his primary care doctor  4. HH PT/RN 5. Dr. 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 male, with a past medical history of mantle cell lymphoma on chemotherapy, hypertension, GERD, dementia, and iatrogenic thrombocytopenia. He saw Dr. Felipa Eth on April 20 and was told he had a virus. His wife found him down on the floor when she awoke on the morning of admission. The patient was unable to give any details due to his dementia. He denied all pain. In the emergency department he was found to have a hematoma on the left side of his head with petechiae in the back of his oropharynx and all extremities. His temperature was 103, platelets 32,000, troponin 0.14, CK is 3664. CT scans of his head and cervical spine as well  as x-rays of his left knee and chest were negative for abnormalities except for the hematoma and atelectasis. Given his fever and cough, I think that he probably had some mild pneumonia causing sepsis. He was started on broad-spectrum antibiotics and will be discharged on levofloxacin to complete a course of antibiotics.   Hospital Course:   Sepsis from possible CAP, temperature trended down, WBC stable, HR with mild bradycardia. Atelectasis on CXR and cough suggestive of possible pneumonia.  - Blood cultures NGTD - UA negative - CXR: atelectasis  - Mild lactic acidosis resolved with IVF - D/c vanc since blood cultures negative - D/c'd zosyn due to thrombocytopenia and started cefepime on 4/23 - will transition to levofloxacin at discharge - Flu: Neg   Fall, likely related to possible pneumonia/infection - PT/OT recommending home health services   Rhabdomyolysis, CK 3664 and rose to 5207 but trended down - Not on statin - consider repeating in 1 week to ensure it was back to baseline  Hematoma of the head with thrombocytopenia, stable - Dr. Tyrell Antonio spoke with Dr. Alvy Bimler who recommended transfusion if platelets drop to 20,000 or below or if hematoma continues to enlarge.  he did not require platelet transfusion during admission.   Elevated troponin, stable and likely due to CKD and rhabdo - No aspirin or heparin at this time due to risk of bleeding. - Tele: Sinus brady -  ECHO:  Mild LVH with EF 55-60%, normal LV function, no regional wall motion abnormalities, trivial AV regurgitation  Mantle  cell lymphoma  - After discussion with oncology will hold chemotherapy medication.  - Dr. Alvy Bimler will follow-up in clinic  Moderate Dementia without disturbance. - Continued Namenda  Hematuria, likely related to thrombocytopenia -  Repeat urinalysis at follow-up appointment and consider referral to urology if persistent  GERD, stable, continue  PPI  Thrombocytopenia, likely due to chemotherapy,Nadired at 26. The trended up to 42,000 spontaneously. He did not require platelet transfusion.  Transaminitis likely due to muscle enzyme release -  Continued namenda -  Repeat a follow-up appointment   Rash on back, shoulders, chest concerning for drug reaction - Last dose of vancomycin was more than 12 hours ago so this is likely not Redman syndrome - May have had allergy to Zosyn - DC'd Zosyn and added to allergy list   Consultants:  Dr. Alvy Bimler by phone  Procedures:  CXR  Antibiotics:  vanc 4/22 >> 4/24  Zosyn 4/22 >> 4/23  Cefepime 4/23 >> 4/25  Discharge Exam: Filed Vitals:   03/21/15 0412  BP: 139/71  Pulse: 68  Temp: 98.5 F (36.9 C)  Resp: 18   Filed Vitals:   03/20/15 0443 03/20/15 1359 03/20/15 2131 03/21/15 0412  BP: 140/55 141/56 152/70 139/71  Pulse: 77 81 92 68  Temp: 98.5 F (36.9 C) 98.6 F (37 C) 99.2 F (37.3 C) 98.5 F (36.9 C)  TempSrc: Oral Oral Oral Oral  Resp: 18 20 17 18   Height:      Weight: 77.429 kg (170 lb 11.2 oz)   78 kg (171 lb 15.3 oz)  SpO2: 100% 100% 98% 99%     General: Average weight adult male, No acute distress  HEENT: Left scalp hematoma is fairly flat, no active bleeding, MMM  Cardiovascular: RRR, nl S1, S2 no mrg, 2+ pulses, warm extremities  Respiratory: CTAB, no increased WOB  MSK: Normal tone and bulk, 1+ pitting LEE  Psych: Mildly confused  Derm: Full body petechia  Discharge Instructions      Discharge Instructions    Call MD for:  difficulty breathing, headache or visual disturbances    Complete by:  As directed      Call MD for:  extreme fatigue    Complete by:  As directed      Call MD for:  hives    Complete by:  As directed      Call MD for:  persistant dizziness or light-headedness    Complete by:  As directed      Call MD for:  persistant nausea and vomiting    Complete by:  As directed      Call MD for:  severe  uncontrolled pain    Complete by:  As directed      Call MD for:  temperature >100.4    Complete by:  As directed      Diet - low sodium heart healthy    Complete by:  As directed      Discharge instructions    Complete by:  As directed   You were hospitalized with a fall which was probably due to some dehydration and mild pneumonia.  Please take levofloxacin for the next few days to complete treatment for your pneumonia.  Your next dose is due tomorrow morning and you take your last dose on Thursday.  Please be sure to drink plenty of fluids.  You will need to follow up with your primary care doctor to have some blood work repeated.  Your potassium level was a little  low, so please take some potassium supplementation for the next three days, your next dose is due tomorrow.  Stop your chemotherapy imbruvica and your aspirin until you follow up with your primary care doctor and your oncologist Dr. Alvy Bimler.  If you have increased confusion, shortness of breath, cough, or uncontrolled bleeding, please call 911.     Increase activity slowly    Complete by:  As directed             Medication List    STOP taking these medications        aspirin 81 MG tablet     ibrutinib 140 MG capsul  Commonly known as:  IMBRUVICA     ondansetron 4 MG disintegrating tablet  Commonly known as:  ZOFRAN ODT      TAKE these medications        EYE-VITE PLUS LUTEIN Caps  Take 1 capsule by mouth 2 (two) times daily.     levofloxacin 750 MG tablet  Commonly known as:  LEVAQUIN  Take 1 tablet (750 mg total) by mouth every other day.     NAMENDA XR PO  Take 1 tablet by mouth daily. 28 mg     NAMENDA XR 28 MG Cp24 24 hr capsule  Generic drug:  memantine  Take 28 mg by mouth daily.     omeprazole 20 MG capsule  Commonly known as:  PRILOSEC  Take 20 mg by mouth daily.       Follow-up Information    Follow up with Mathews Argyle, MD.   Specialty:  Internal Medicine   Contact information:    [address] [place] [address] [phone]       Follow up with [facility], NI, MD. Schedule an appointment as soon as possible for a visit in 2 weeks.   Specialty:  Hematology and Oncology   Contact information:   [place] [address] [phone]        The results of significant diagnostics from this hospitalization (including imaging, microbiology, ancillary and laboratory) are listed below for reference.    Significant Diagnostic Studies: Ct Head Wo Contrast  03/18/2015   CLINICAL DATA:  Fall in bathroom.  Hit right side of head.  EXAM: CT HEAD WITHOUT CONTRAST  CT CERVICAL SPINE WITHOUT CONTRAST  TECHNIQUE: Multidetector CT imaging of the head and cervical spine was performed following the standard protocol without intravenous contrast. Multiplanar CT image reconstructions of the cervical spine were also generated.  COMPARISON:  None.  FINDINGS: CT HEAD FINDINGS  Soft tissue swelling in the left side of the forehead. No underlying calvarial fracture. There is atrophy and chronic small vessel disease changes. No acute intracranial abnormality. Specifically, no hemorrhage, hydrocephalus, mass lesion, acute infarction, or significant intracranial injury. No acute calvarial abnormality.  CT CERVICAL SPINE FINDINGS  Mild degenerative disc disease changes at C5-6 and C6-7 with disc space narrowing and spurring. Mild diffuse degenerative facet disease. Normal alignment. Prevertebral soft tissues are normal. No fracture. No epidural or paraspinal hematoma.  IMPRESSION: No acute intracranial abnormality.  Spondylosis.  No acute bony abnormality in the cervical spine.   Electronically Signed   By: Rolm Baptise M.D.   On: 03/18/2015 06:09   Ct Cervical Spine Wo Contrast  03/18/2015   CLINICAL DATA:  Fall in bathroom.  Hit right side of head.  EXAM: CT HEAD WITHOUT CONTRAST  CT CERVICAL SPINE WITHOUT CONTRAST  TECHNIQUE: Multidetector CT imaging of the head and  cervical spine was performed following the standard protocol without intravenous contrast. Multiplanar CT image reconstructions of the cervical spine were also generated.  COMPARISON:  None.  FINDINGS: CT HEAD FINDINGS  Soft tissue swelling in the left side of the forehead. No underlying calvarial fracture. There is atrophy and chronic small vessel disease changes. No acute intracranial abnormality. Specifically, no hemorrhage, hydrocephalus, mass lesion, acute infarction, or significant intracranial injury. No acute calvarial abnormality.  CT CERVICAL SPINE FINDINGS  Mild degenerative disc disease changes at C5-6 and C6-7 with disc space narrowing and spurring. Mild diffuse degenerative facet disease. Normal alignment. Prevertebral soft tissues are normal. No fracture. No epidural or paraspinal hematoma.  IMPRESSION: No acute intracranial abnormality.  Spondylosis.  No acute bony abnormality in the cervical spine.   Electronically Signed   By: Rolm Baptise M.D.   On: 03/18/2015 06:09   Dg Chest Port 1 View  (if Code Sepsis Called)  03/18/2015   CLINICAL DATA:  Found on floor. Hematoma to left forehead. Abrasions to left chest.  EXAM: PORTABLE CHEST - 1 VIEW  COMPARISON:  Chest CT 11/01/2014  FINDINGS: Minimal bibasilar atelectasis. Heart is normal size. No effusions. No pneumothorax. No acute bony abnormality.  IMPRESSION: Bibasilar atelectasis.   Electronically Signed   By: Rolm Baptise M.D.   On: 03/18/2015 04:15   Dg Shoulder Left Port  03/18/2015   CLINICAL DATA:  Recent fall with left shoulder pain, initial encounter  EXAM: LEFT SHOULDER - 1 VIEW  COMPARISON:  None.  FINDINGS: Mild degenerative changes of the acromioclavicular joint are seen. No acute fracture or dislocation is noted. No gross soft tissue abnormality is seen.  IMPRESSION: No acute abnormality noted.   Electronically Signed   By: Inez Catalina M.D.   On: 03/18/2015 09:21   Dg Knee Complete 4 Views Left  03/16/2015   CLINICAL DATA:  Left  knee pain, possible arthritis  EXAM: LEFT KNEE - COMPLETE 4+ VIEW  COMPARISON:  None.  FINDINGS: Four views of the left knee submitted. No acute fracture or subluxation. Mild narrowing of medial joint compartment. There is narrowing of patellofemoral joint space. Small joint effusion. Spurring of patella.  IMPRESSION: No acute fracture or subluxation. Mild osteoarthritic changes as described above.   Electronically Signed   By: Lahoma Crocker M.D.   On: 03/16/2015 15:09    Microbiology: Recent Results (from the past 240 hour(s))  Culture, blood (routine x 2)     Status: None (Preliminary result)   Collection Time: 03/18/15  4:10 AM  Result Value Ref Range Status   Specimen Description BLOOD LEFT ANTECUBITAL  Final   Special Requests BOTTLES DRAWN AEROBIC AND ANAEROBIC 5CC EA  Final   Culture   Final           BLOOD CULTURE RECEIVED NO GROWTH TO DATE CULTURE WILL BE HELD FOR 5 DAYS BEFORE ISSUING A FINAL NEGATIVE REPORT Performed at [facility]    Report Status PENDING  Incomplete  Culture, blood (routine x 2)     Status: None (Preliminary result)   Collection Time: 03/18/15  4:12 AM  Result Value Ref Range Status   Specimen Description BLOOD RIGHT ANTECUBITAL  Final   Special Requests BOTTLES DRAWN AEROBIC AND ANAEROBIC 5CC EA  Final   Culture   Final           BLOOD CULTURE RECEIVED NO GROWTH TO DATE CULTURE WILL BE HELD FOR 5 DAYS BEFORE ISSUING A FINAL NEGATIVE REPORT Performed at  [facility]    Report Status PENDING  Incomplete     Labs: Basic Metabolic Panel:  Recent Labs Lab 03/18/15 0409 03/19/15 0534 03/20/15 0708 03/21/15 0445  NA 133* 140 140 142  K 4.2 3.6 3.2* 3.4*  CL 102 108 109 111  CO2 22 22 21 21   GLUCOSE 134* 96 97 97  BUN 18 13 7 9   CREATININE 1.20 1.22 0.93 1.32  CALCIUM 8.0* 7.6* 7.2* 7.3*   Liver Function Tests:  Recent Labs Lab 03/18/15 0409 03/19/15 0534 03/21/15 0445  AST 55* 133* 84*  ALT 35 48 66*  ALKPHOS 67 58 57   BILITOT 1.4* 1.4* 1.4*  PROT 5.5* 4.9* 5.0*  ALBUMIN 3.5 3.0* 2.7*   No results for input(s): LIPASE, AMYLASE in the last 168 hours. No results for input(s): AMMONIA in the last 168 hours. CBC:  Recent Labs Lab 03/18/15 0409 03/18/15 1030 03/19/15 0534 03/20/15 0708 03/21/15 0445  WBC 3.9* 3.6* 4.0 3.8* 3.7*  NEUTROABS 3.4 2.9  --   --   --   HGB 16.1 15.0 14.9 14.2 13.6  HCT 46.2 43.3 43.9 41.2 39.4  MCV 90.9 91.0 91.1 90.5 90.0  PLT 32* 32* 26* 32* 42*   Cardiac Enzymes:  Recent Labs Lab 03/18/15 0722 03/18/15 1344 03/18/15 1925 03/19/15 0534 03/19/15 1127 03/20/15 0708 03/21/15 0445  CKTOTAL 3664*  --   --  5207* 4855* 2225* 1126*  TROPONINI 0.14* 0.12* 0.13*  --   --   --   --    BNP: BNP (last 3 results) No results for input(s): BNP in the last 8760 hours.  ProBNP (last 3 results) No results for input(s): PROBNP in the last 8760 hours.  CBG: No results for input(s): GLUCAP in the last 168 hours.  Time coordinating discharge: 35 minutes  Signed:  Rhiannon Sassaman  [facility] 03/21/2015, 1:56 PM

## 2015-03-21 NOTE — Progress Notes (Signed)
Medicare Important Message given? YES  Date Medicare IM given:  03/21/2015 Medicare IM given by: Jonnie Finner

## 2015-03-21 NOTE — Progress Notes (Signed)
CARE MANAGEMENT NOTE 03/21/2015  Patient:  Jeffrey Simmons, Jeffrey Simmons   Account Number:  [id number]  Date Initiated:  03/21/2015  Documentation initiated by:  [facility]  Subjective/Objective Assessment:   sepsis, fall     Action/Plan:   lives at [place] with wife   Anticipated DC Date:  03/21/2015   Anticipated DC Plan:  [place]  CM consult      [facility] Choice  HOME HEALTH   Choice offered to / List presented to:  C-3 Spouse        HH arranged  HH-1 RN  HH-2 PT      [facility] agency  [place]   Status of service:  Completed, signed off Medicare Important Message given?  YES (If response is "NO", the following Medicare IM given date fields will be blank) Date Medicare IM given:  03/21/2015 Medicare IM given by:  [facility] Date Additional Medicare IM given:   Additional Medicare IM given by:    Discharge Disposition:  [place]  Per UR Regulation:    If discussed at Long Length of Stay Meetings, dates discussed:    Comments:  03/21/2015 1030 NCM spoke to pt and gave permission to speak to wife. Wife states to contact [place] [phone] at [place] to arrange [facility]. Requesting RW for home. Contacted [facility] for RW for home. Jonnie Finner RN CCM Case Mgmt phone [phone]  03/21/2015 1400 Scott from [place] arranged [place] with [place]. [place] [facility] rep notified and accepted referral. She will contact wife to arrange appt. Contacted info for [place] placed on dc instructions.  Jonnie Finner RN CCM Case Mgmt phone [phone]

## 2015-03-21 NOTE — Telephone Encounter (Signed)
pt just got out of the hospital....pt aware of appt

## 2015-03-21 NOTE — Progress Notes (Signed)
D/c instructions reviewed with pt and his wife. Copy of instructions given to pt. Pt d/c'd via wheelchair with belongings, with family, escorted by unit NT.

## 2015-03-24 DIAGNOSIS — S0083XD Contusion of other part of head, subsequent encounter: Secondary | ICD-10-CM | POA: Diagnosis not present

## 2015-03-24 DIAGNOSIS — F0390 Unspecified dementia without behavioral disturbance: Secondary | ICD-10-CM | POA: Diagnosis not present

## 2015-03-24 DIAGNOSIS — C8313 Mantle cell lymphoma, intra-abdominal lymph nodes: Secondary | ICD-10-CM | POA: Diagnosis not present

## 2015-03-24 DIAGNOSIS — N189 Chronic kidney disease, unspecified: Secondary | ICD-10-CM | POA: Diagnosis not present

## 2015-03-24 DIAGNOSIS — I129 Hypertensive chronic kidney disease with stage 1 through stage 4 chronic kidney disease, or unspecified chronic kidney disease: Secondary | ICD-10-CM | POA: Diagnosis not present

## 2015-03-24 DIAGNOSIS — D6959 Other secondary thrombocytopenia: Secondary | ICD-10-CM | POA: Diagnosis not present

## 2015-03-24 LAB — CULTURE, BLOOD (ROUTINE X 2)
CULTURE: NO GROWTH
Culture: NO GROWTH

## 2015-03-28 DIAGNOSIS — C8590 Non-Hodgkin lymphoma, unspecified, unspecified site: Secondary | ICD-10-CM | POA: Diagnosis not present

## 2015-03-28 DIAGNOSIS — G301 Alzheimer's disease with late onset: Secondary | ICD-10-CM | POA: Diagnosis not present

## 2015-03-29 DIAGNOSIS — C8313 Mantle cell lymphoma, intra-abdominal lymph nodes: Secondary | ICD-10-CM | POA: Diagnosis not present

## 2015-03-29 DIAGNOSIS — I129 Hypertensive chronic kidney disease with stage 1 through stage 4 chronic kidney disease, or unspecified chronic kidney disease: Secondary | ICD-10-CM | POA: Diagnosis not present

## 2015-03-29 DIAGNOSIS — F0390 Unspecified dementia without behavioral disturbance: Secondary | ICD-10-CM | POA: Diagnosis not present

## 2015-03-29 DIAGNOSIS — D6959 Other secondary thrombocytopenia: Secondary | ICD-10-CM | POA: Diagnosis not present

## 2015-03-29 DIAGNOSIS — N189 Chronic kidney disease, unspecified: Secondary | ICD-10-CM | POA: Diagnosis not present

## 2015-03-29 DIAGNOSIS — S0083XD Contusion of other part of head, subsequent encounter: Secondary | ICD-10-CM | POA: Diagnosis not present

## 2015-03-30 ENCOUNTER — Other Ambulatory Visit (HOSPITAL_BASED_OUTPATIENT_CLINIC_OR_DEPARTMENT_OTHER): Payer: Medicare Other

## 2015-03-30 ENCOUNTER — Ambulatory Visit (HOSPITAL_BASED_OUTPATIENT_CLINIC_OR_DEPARTMENT_OTHER): Payer: Medicare Other | Admitting: Hematology and Oncology

## 2015-03-30 ENCOUNTER — Telehealth: Payer: Self-pay | Admitting: Hematology and Oncology

## 2015-03-30 ENCOUNTER — Encounter: Payer: Self-pay | Admitting: Hematology and Oncology

## 2015-03-30 ENCOUNTER — Other Ambulatory Visit: Payer: Self-pay | Admitting: Hematology and Oncology

## 2015-03-30 ENCOUNTER — Telehealth: Payer: Self-pay | Admitting: *Deleted

## 2015-03-30 VITALS — BP 152/69 | HR 66 | Temp 97.5°F | Resp 18 | Ht 73.0 in | Wt 179.5 lb

## 2015-03-30 DIAGNOSIS — D72818 Other decreased white blood cell count: Secondary | ICD-10-CM

## 2015-03-30 DIAGNOSIS — C8313 Mantle cell lymphoma, intra-abdominal lymph nodes: Secondary | ICD-10-CM

## 2015-03-30 DIAGNOSIS — D6959 Other secondary thrombocytopenia: Secondary | ICD-10-CM | POA: Diagnosis not present

## 2015-03-30 DIAGNOSIS — T451X5A Adverse effect of antineoplastic and immunosuppressive drugs, initial encounter: Secondary | ICD-10-CM

## 2015-03-30 DIAGNOSIS — T50905A Adverse effect of unspecified drugs, medicaments and biological substances, initial encounter: Secondary | ICD-10-CM

## 2015-03-30 DIAGNOSIS — D701 Agranulocytosis secondary to cancer chemotherapy: Secondary | ICD-10-CM

## 2015-03-30 LAB — CBC WITH DIFFERENTIAL/PLATELET
BASO%: 0.4 % (ref 0.0–2.0)
BASOS ABS: 0 10*3/uL (ref 0.0–0.1)
EOS ABS: 0.1 10*3/uL (ref 0.0–0.5)
EOS%: 1.9 % (ref 0.0–7.0)
HEMATOCRIT: 38 % — AB (ref 38.4–49.9)
HEMOGLOBIN: 12.8 g/dL — AB (ref 13.0–17.1)
LYMPH%: 55.3 % — AB (ref 14.0–49.0)
MCH: 30.8 pg (ref 27.2–33.4)
MCHC: 33.8 g/dL (ref 32.0–36.0)
MCV: 91.2 fL (ref 79.3–98.0)
MONO#: 0.5 10*3/uL (ref 0.1–0.9)
MONO%: 15.4 % — AB (ref 0.0–14.0)
NEUT#: 0.8 10*3/uL — ABNORMAL LOW (ref 1.5–6.5)
NEUT%: 27 % — AB (ref 39.0–75.0)
Platelets: 147 10*3/uL (ref 140–400)
RBC: 4.17 10*6/uL — ABNORMAL LOW (ref 4.20–5.82)
RDW: 14 % (ref 11.0–14.6)
WBC: 3 10*3/uL — ABNORMAL LOW (ref 4.0–10.3)
lymph#: 1.6 10*3/uL (ref 0.9–3.3)

## 2015-03-30 LAB — COMPREHENSIVE METABOLIC PANEL (CC13)
ALT: 39 U/L (ref 0–55)
ANION GAP: 7 meq/L (ref 3–11)
AST: 24 U/L (ref 5–34)
Albumin: 2.9 g/dL — ABNORMAL LOW (ref 3.5–5.0)
Alkaline Phosphatase: 86 U/L (ref 40–150)
BILIRUBIN TOTAL: 0.73 mg/dL (ref 0.20–1.20)
BUN: 17.7 mg/dL (ref 7.0–26.0)
CO2: 25 mEq/L (ref 22–29)
CREATININE: 1 mg/dL (ref 0.7–1.3)
Calcium: 8.3 mg/dL — ABNORMAL LOW (ref 8.4–10.4)
Chloride: 110 mEq/L — ABNORMAL HIGH (ref 98–109)
EGFR: 69 mL/min/{1.73_m2} — AB (ref >90)
GLUCOSE: 93 mg/dL (ref 70–140)
Potassium: 3.9 mEq/L (ref 3.5–5.1)
SODIUM: 142 meq/L (ref 136–145)
Total Protein: 5.2 g/dL — ABNORMAL LOW (ref 6.4–8.3)

## 2015-03-30 LAB — LACTATE DEHYDROGENASE (CC13): LDH: 273 U/L — ABNORMAL HIGH (ref 125–245)

## 2015-03-30 NOTE — Telephone Encounter (Signed)
Received call from wife [name] re:  Pt just left office visit today with Dr. Alvy Bimler.  Pt and wife were unsure whether pt should go back to taking Imbruvica or not.  Pt would like clarification and further instructions from Dr. Alvy Bimler. [facility]   Phone    [phone].

## 2015-03-30 NOTE — Telephone Encounter (Signed)
Hold ibrutinib until labs next week is OK

## 2015-03-30 NOTE — Telephone Encounter (Signed)
Gave patient avs report and appointments for July. [facility] will call re ct - patient aware.  °

## 2015-03-30 NOTE — Telephone Encounter (Signed)
Patient/wife stopped back by scheduling after lab re NG wanting to add another lab appointment. Added lab 5/11 per 2nd 5/4 pof. Wife given appointment with schedule with added date for 5/11.

## 2015-03-30 NOTE — Telephone Encounter (Signed)
Spoke with [name] and informed her re:  Per Dr. Alvy Bimler -  HOLD  Ibrutinib until labs next week is OK.  Jeffrey Simmons voiced understanding.

## 2015-03-31 DIAGNOSIS — C8313 Mantle cell lymphoma, intra-abdominal lymph nodes: Secondary | ICD-10-CM | POA: Diagnosis not present

## 2015-03-31 DIAGNOSIS — I129 Hypertensive chronic kidney disease with stage 1 through stage 4 chronic kidney disease, or unspecified chronic kidney disease: Secondary | ICD-10-CM | POA: Diagnosis not present

## 2015-03-31 DIAGNOSIS — F0390 Unspecified dementia without behavioral disturbance: Secondary | ICD-10-CM | POA: Diagnosis not present

## 2015-03-31 DIAGNOSIS — D6959 Other secondary thrombocytopenia: Secondary | ICD-10-CM | POA: Diagnosis not present

## 2015-03-31 DIAGNOSIS — D701 Agranulocytosis secondary to cancer chemotherapy: Secondary | ICD-10-CM | POA: Insufficient documentation

## 2015-03-31 DIAGNOSIS — S0083XD Contusion of other part of head, subsequent encounter: Secondary | ICD-10-CM | POA: Diagnosis not present

## 2015-03-31 DIAGNOSIS — T451X5A Adverse effect of antineoplastic and immunosuppressive drugs, initial encounter: Secondary | ICD-10-CM

## 2015-03-31 DIAGNOSIS — N189 Chronic kidney disease, unspecified: Secondary | ICD-10-CM | POA: Diagnosis not present

## 2015-03-31 NOTE — Assessment & Plan Note (Signed)
This has resolved since we held his treatment. I recommend we continue to hold onto the leukopenia recovers.

## 2015-03-31 NOTE — Progress Notes (Signed)
[facility] OFFICE PROGRESS NOTE  Patient Care Team: Lajean Manes, MD as PCP - General (Internal Medicine)  SUMMARY OF ONCOLOGIC HISTORY:   Mantle cell lymphoma of intra-abdominal lymph nodes   03/04/2012 Initial Diagnosis Mantle cell lymphoma of intra-abdominal lymph nodes   03/18/2015 - 03/21/2015 Hospital Admission He was admitted to the hospital after he was found to have a fall. Chemotherapy was put on hold.    This patient was diagnosed with mantle cell lymphoma in February of 2010. He presented with increasing fatigue and abdominal fullness. CT scan showed splenomegaly, periportal lymphadenopathy, bilateral iliac adenopathy, and a dominant lymph node mass in the right pelvis adjacent to the right external iliac vessels measuring 4.8 x 2.3 cm. Additional right inguinal adenopathy 2.9 x 2.6 cm. Diagnosis established by right inguinal lymph node biopsy done 02/01/2009 which showed a CD5 positive large B-cell lymphoma CD20 positive CD5 and CD10 positive lambda light chain restriction. Bone marrow with atypical nodular lymphoid aggregates. Cytogenetic studies did show a population of cells with the 11;14 translocation consistent with mantle cell lymphoma. He was living in [place] at the time. He was treated with 4 cycles of Cytoxan vincristine prednisone and Rituxan followed by 4 cycles of Cytoxan Novantrone vincristine and prednisone through 07/12/2009. He achieved a complete response.  Most recent survey CT scan done 11/24/2013 showed a single area of slowly enlarging lymphadenopathy involving the right external iliac node compared with 08/26/2013 study. Spleen stable at upper limit of normal size. Based on these results, from February of 2015, he was started on Ibrutinib. On May 2015, repeat CT scan showed near complete response to treatment. In December 2015, repeat CT scan show complete response to treatment  INTERVAL HISTORY: Please see below for problem oriented  charting. He is seen as part of hospital follow-up. He did not remember what happened. Treatment was held. His bruising had recover. The patient denies any recent signs or symptoms of bleeding such as spontaneous epistaxis, hematuria or hematochezia.   REVIEW OF SYSTEMS:   Constitutional: Denies fevers, chills or abnormal weight loss Eyes: Denies blurriness of vision Ears, nose, mouth, throat, and face: Denies mucositis or sore throat Respiratory: Denies cough, dyspnea or wheezes Cardiovascular: Denies palpitation, chest discomfort or lower extremity swelling Gastrointestinal:  Denies nausea, heartburn or change in bowel habits Skin: Denies abnormal skin rashes Lymphatics: Denies new lymphadenopathy or easy bruising Neurological:Denies numbness, tingling or new weaknesses Behavioral/Psych: Mood is stable, no new changes  All other systems were reviewed with the patient and are negative.  I have reviewed the past medical history, past surgical history, social history and family history with the patient and they are unchanged from previous note.  ALLERGIES:  is allergic to aricept and zosyn.  MEDICATIONS:  Current Outpatient Prescriptions  Medication Sig Dispense Refill  . Multiple Vitamins-Minerals (EYE-VITE PLUS LUTEIN) CAPS Take 1 capsule by mouth 2 (two) times daily.    Marland Kitchen NAMENDA XR 28 MG CP24 24 hr capsule Take 28 mg by mouth daily.     Marland Kitchen omeprazole (PRILOSEC) 20 MG capsule Take 20 mg by mouth daily.     No current facility-administered medications for this visit.    PHYSICAL EXAMINATION: ECOG PERFORMANCE STATUS: 0 - Asymptomatic  Filed Vitals:   03/30/15 0841  BP: 152/69  Pulse: 66  Temp: 97.5 F (36.4 C)  Resp: 18   Filed Weights   03/30/15 0841  Weight: 179 lb 8 oz (81.421 kg)    GENERAL:alert, no distress  and comfortable SKIN: Noted skin bruising. No petechiae. EYES: normal, Conjunctiva are pink and non-injected, sclera clear OROPHARYNX:no exudate, no  erythema and lips, buccal mucosa, and tongue normal  NECK: supple, thyroid normal size, non-tender, without nodularity LYMPH:  no palpable lymphadenopathy in the cervical, axillary or inguinal LUNGS: clear to auscultation and percussion with normal breathing effort HEART: regular rate & rhythm and no murmurs and no lower extremity edema ABDOMEN:abdomen soft, non-tender and normal bowel sounds Musculoskeletal:no cyanosis of digits and no clubbing  NEURO: alert & oriented x 3 with fluent speech, no focal motor/sensory deficits  LABORATORY DATA:  I have reviewed the data as listed    Component Value Date/Time   NA 142 03/30/2015 0904   NA 142 03/21/2015 0445   NA 145 02/27/2012 1158   K 3.9 03/30/2015 0904   K 3.4* 03/21/2015 0445   K 4.4 02/27/2012 1158   CL 111 03/21/2015 0445   CL 103 02/24/2013 0923   CL 102 02/27/2012 1158   CO2 25 03/30/2015 0904   CO2 21 03/21/2015 0445   CO2 30 02/27/2012 1158   GLUCOSE 93 03/30/2015 0904   GLUCOSE 97 03/21/2015 0445   GLUCOSE 98 02/24/2013 0923   GLUCOSE 96 02/27/2012 1158   BUN 17.7 03/30/2015 0904   BUN 9 03/21/2015 0445   BUN 14 02/27/2012 1158   CREATININE 1.0 03/30/2015 0904   CREATININE 1.32 03/21/2015 0445   CREATININE 1.0 02/27/2012 1158   CALCIUM 8.3* 03/30/2015 0904   CALCIUM 7.3* 03/21/2015 0445   CALCIUM 8.7 02/27/2012 1158   PROT 5.2* 03/30/2015 0904   PROT 5.0* 03/21/2015 0445   PROT 6.8 02/27/2012 1158   ALBUMIN 2.9* 03/30/2015 0904   ALBUMIN 2.7* 03/21/2015 0445   AST 24 03/30/2015 0904   AST 84* 03/21/2015 0445   AST 33 02/27/2012 1158   ALT 39 03/30/2015 0904   ALT 66* 03/21/2015 0445   ALT 26 02/27/2012 1158   ALKPHOS 86 03/30/2015 0904   ALKPHOS 57 03/21/2015 0445   ALKPHOS 105* 02/27/2012 1158   BILITOT 0.73 03/30/2015 0904   BILITOT 1.4* 03/21/2015 0445   BILITOT 1.10 02/27/2012 1158   GFRNONAA 47* 03/21/2015 0445   GFRAA 55* 03/21/2015 0445    No results found for: SPEP, UPEP  Lab Results   Component Value Date   WBC 3.0* 03/30/2015   NEUTROABS 0.8* 03/30/2015   HGB 12.8* 03/30/2015   HCT 38.0* 03/30/2015   MCV 91.2 03/30/2015   PLT 147 03/30/2015      Chemistry      Component Value Date/Time   NA 142 03/30/2015 0904   NA 142 03/21/2015 0445   NA 145 02/27/2012 1158   K 3.9 03/30/2015 0904   K 3.4* 03/21/2015 0445   K 4.4 02/27/2012 1158   CL 111 03/21/2015 0445   CL 103 02/24/2013 0923   CL 102 02/27/2012 1158   CO2 25 03/30/2015 0904   CO2 21 03/21/2015 0445   CO2 30 02/27/2012 1158   BUN 17.7 03/30/2015 0904   BUN 9 03/21/2015 0445   BUN 14 02/27/2012 1158   CREATININE 1.0 03/30/2015 0904   CREATININE 1.32 03/21/2015 0445   CREATININE 1.0 02/27/2012 1158      Component Value Date/Time   CALCIUM 8.3* 03/30/2015 0904   CALCIUM 7.3* 03/21/2015 0445   CALCIUM 8.7 02/27/2012 1158   ALKPHOS 86 03/30/2015 0904   ALKPHOS 57 03/21/2015 0445   ALKPHOS 105* 02/27/2012 1158   AST 24 03/30/2015 0904  AST 84* 03/21/2015 0445   AST 33 02/27/2012 1158   ALT 39 03/30/2015 0904   ALT 66* 03/21/2015 0445   ALT 26 02/27/2012 1158   BILITOT 0.73 03/30/2015 0904   BILITOT 1.4* 03/21/2015 0445   BILITOT 1.10 02/27/2012 1158      ASSESSMENT & PLAN:  Mantle cell lymphoma of intra-abdominal lymph nodes His last imaging study show excellent response to treatment. His next imaging is not for another 2 months. I recommend we continue to hold Ibrutinib due to mild leukopenia of unknown reason. I will bring him back again for repeat blood work next week. If his white count recovers, I will restart Ibrutinib with reduced dose of 2 tablets daily rather than 3 tablets.   Thrombocytopenia due to drugs This has resolved since we held his treatment. I recommend we continue to hold onto the leukopenia recovers.   Leukopenia due to antineoplastic chemotherapy This is likely related to recent injury and treatment. He will come back next week to have CBC rechecked. Once  his neutropenia recovers, we will restart his chemotherapy at reduced dose.    Orders Placed This Encounter  Procedures  . CT Chest W Contrast    Standing Status: Future     Number of Occurrences:      Standing Expiration Date: 05/29/2016    Order Specific Question:  Reason for Exam (SYMPTOM  OR DIAGNOSIS REQUIRED)    Answer:  staging lymphoma    Order Specific Question:  Preferred imaging location?    Answer:  [facility]  . CT Abdomen Pelvis W Contrast    Standing Status: Future     Number of Occurrences:      Standing Expiration Date: 06/29/2016    Order Specific Question:  Reason for Exam (SYMPTOM  OR DIAGNOSIS REQUIRED)    Answer:  staging lymphoma    Order Specific Question:  Preferred imaging location?    Answer:  [facility]   All questions were answered. The patient knows to call the clinic with any problems, questions or concerns. No barriers to learning was detected. I spent 25 minutes counseling the patient face to face. The total time spent in the appointment was 30 minutes and more than 50% was on counseling and review of test results     [facility], [place], MD 03/31/2015 9:49 AM

## 2015-03-31 NOTE — Assessment & Plan Note (Signed)
This is likely related to recent injury and treatment. He will come back next week to have CBC rechecked. Once his neutropenia recovers, we will restart his chemotherapy at reduced dose.

## 2015-03-31 NOTE — Assessment & Plan Note (Signed)
His last imaging study show excellent response to treatment. His next imaging is not for another 2 months. I recommend we continue to hold Ibrutinib due to mild leukopenia of unknown reason. I will bring him back again for repeat blood work next week. If his white count recovers, I will restart Ibrutinib with reduced dose of 2 tablets daily rather than 3 tablets.

## 2015-04-01 DIAGNOSIS — S0083XD Contusion of other part of head, subsequent encounter: Secondary | ICD-10-CM | POA: Diagnosis not present

## 2015-04-01 DIAGNOSIS — C8313 Mantle cell lymphoma, intra-abdominal lymph nodes: Secondary | ICD-10-CM | POA: Diagnosis not present

## 2015-04-01 DIAGNOSIS — D6959 Other secondary thrombocytopenia: Secondary | ICD-10-CM | POA: Diagnosis not present

## 2015-04-01 DIAGNOSIS — I129 Hypertensive chronic kidney disease with stage 1 through stage 4 chronic kidney disease, or unspecified chronic kidney disease: Secondary | ICD-10-CM | POA: Diagnosis not present

## 2015-04-01 DIAGNOSIS — F0390 Unspecified dementia without behavioral disturbance: Secondary | ICD-10-CM | POA: Diagnosis not present

## 2015-04-01 DIAGNOSIS — N189 Chronic kidney disease, unspecified: Secondary | ICD-10-CM | POA: Diagnosis not present

## 2015-04-05 DIAGNOSIS — S0083XD Contusion of other part of head, subsequent encounter: Secondary | ICD-10-CM | POA: Diagnosis not present

## 2015-04-05 DIAGNOSIS — I129 Hypertensive chronic kidney disease with stage 1 through stage 4 chronic kidney disease, or unspecified chronic kidney disease: Secondary | ICD-10-CM | POA: Diagnosis not present

## 2015-04-05 DIAGNOSIS — N189 Chronic kidney disease, unspecified: Secondary | ICD-10-CM | POA: Diagnosis not present

## 2015-04-05 DIAGNOSIS — D6959 Other secondary thrombocytopenia: Secondary | ICD-10-CM | POA: Diagnosis not present

## 2015-04-05 DIAGNOSIS — F0390 Unspecified dementia without behavioral disturbance: Secondary | ICD-10-CM | POA: Diagnosis not present

## 2015-04-05 DIAGNOSIS — C8313 Mantle cell lymphoma, intra-abdominal lymph nodes: Secondary | ICD-10-CM | POA: Diagnosis not present

## 2015-04-06 ENCOUNTER — Other Ambulatory Visit (HOSPITAL_BASED_OUTPATIENT_CLINIC_OR_DEPARTMENT_OTHER): Payer: Medicare Other

## 2015-04-06 ENCOUNTER — Other Ambulatory Visit: Payer: Self-pay | Admitting: Hematology and Oncology

## 2015-04-06 ENCOUNTER — Telehealth: Payer: Self-pay | Admitting: *Deleted

## 2015-04-06 ENCOUNTER — Telehealth: Payer: Self-pay | Admitting: Hematology and Oncology

## 2015-04-06 DIAGNOSIS — C8313 Mantle cell lymphoma, intra-abdominal lymph nodes: Secondary | ICD-10-CM | POA: Diagnosis not present

## 2015-04-06 DIAGNOSIS — S0083XD Contusion of other part of head, subsequent encounter: Secondary | ICD-10-CM | POA: Diagnosis not present

## 2015-04-06 DIAGNOSIS — D6959 Other secondary thrombocytopenia: Secondary | ICD-10-CM | POA: Diagnosis not present

## 2015-04-06 DIAGNOSIS — I129 Hypertensive chronic kidney disease with stage 1 through stage 4 chronic kidney disease, or unspecified chronic kidney disease: Secondary | ICD-10-CM | POA: Diagnosis not present

## 2015-04-06 DIAGNOSIS — N189 Chronic kidney disease, unspecified: Secondary | ICD-10-CM | POA: Diagnosis not present

## 2015-04-06 DIAGNOSIS — F0390 Unspecified dementia without behavioral disturbance: Secondary | ICD-10-CM | POA: Diagnosis not present

## 2015-04-06 LAB — CBC WITH DIFFERENTIAL/PLATELET
BASO%: 1.6 % (ref 0.0–2.0)
Basophils Absolute: 0 10*3/uL (ref 0.0–0.1)
EOS ABS: 0.1 10*3/uL (ref 0.0–0.5)
EOS%: 2.3 % (ref 0.0–7.0)
HEMATOCRIT: 37.6 % — AB (ref 38.4–49.9)
HGB: 12.6 g/dL — ABNORMAL LOW (ref 13.0–17.1)
LYMPH%: 32.8 % (ref 14.0–49.0)
MCH: 30.7 pg (ref 27.2–33.4)
MCHC: 33.6 g/dL (ref 32.0–36.0)
MCV: 91.5 fL (ref 79.3–98.0)
MONO#: 0.4 10*3/uL (ref 0.1–0.9)
MONO%: 12.3 % (ref 0.0–14.0)
NEUT#: 1.5 10*3/uL (ref 1.5–6.5)
NEUT%: 51 % (ref 39.0–75.0)
PLATELETS: 108 10*3/uL — AB (ref 140–400)
RBC: 4.11 10*6/uL — ABNORMAL LOW (ref 4.20–5.82)
RDW: 14.5 % (ref 11.0–14.6)
WBC: 2.9 10*3/uL — AB (ref 4.0–10.3)
lymph#: 1 10*3/uL (ref 0.9–3.3)

## 2015-04-06 LAB — COMPREHENSIVE METABOLIC PANEL (CC13)
ALBUMIN: 2.9 g/dL — AB (ref 3.5–5.0)
ALK PHOS: 97 U/L (ref 40–150)
ALT: 22 U/L (ref 0–55)
ANION GAP: 9 meq/L (ref 3–11)
AST: 18 U/L (ref 5–34)
BILIRUBIN TOTAL: 1.05 mg/dL (ref 0.20–1.20)
BUN: 16.7 mg/dL (ref 7.0–26.0)
CHLORIDE: 107 meq/L (ref 98–109)
CO2: 27 mEq/L (ref 22–29)
CREATININE: 1.2 mg/dL (ref 0.7–1.3)
Calcium: 8.1 mg/dL — ABNORMAL LOW (ref 8.4–10.4)
EGFR: 55 mL/min/{1.73_m2} — AB (ref >90)
Glucose: 113 mg/dl (ref 70–140)
POTASSIUM: 3.9 meq/L (ref 3.5–5.1)
SODIUM: 143 meq/L (ref 136–145)
Total Protein: 5.3 g/dL — ABNORMAL LOW (ref 6.4–8.3)

## 2015-04-06 LAB — LACTATE DEHYDROGENASE (CC13): LDH: 230 U/L (ref 125–245)

## 2015-04-06 NOTE — Telephone Encounter (Signed)
Pt instructed to restart Imbruvia at 2 tablets daily, will have return appt on 5/31 with Dr Alvy Bimler. Verbalized understanding

## 2015-04-06 NOTE — Telephone Encounter (Signed)
Pt instructed to restart ASA 81 mg daily

## 2015-04-06 NOTE — Telephone Encounter (Signed)
s.w. pt wife and advised on May appt....ok and aware

## 2015-04-07 DIAGNOSIS — F0390 Unspecified dementia without behavioral disturbance: Secondary | ICD-10-CM | POA: Diagnosis not present

## 2015-04-07 DIAGNOSIS — D6959 Other secondary thrombocytopenia: Secondary | ICD-10-CM | POA: Diagnosis not present

## 2015-04-07 DIAGNOSIS — C8313 Mantle cell lymphoma, intra-abdominal lymph nodes: Secondary | ICD-10-CM | POA: Diagnosis not present

## 2015-04-07 DIAGNOSIS — S0083XD Contusion of other part of head, subsequent encounter: Secondary | ICD-10-CM | POA: Diagnosis not present

## 2015-04-07 DIAGNOSIS — I129 Hypertensive chronic kidney disease with stage 1 through stage 4 chronic kidney disease, or unspecified chronic kidney disease: Secondary | ICD-10-CM | POA: Diagnosis not present

## 2015-04-07 DIAGNOSIS — N189 Chronic kidney disease, unspecified: Secondary | ICD-10-CM | POA: Diagnosis not present

## 2015-04-08 ENCOUNTER — Other Ambulatory Visit: Payer: Self-pay | Admitting: *Deleted

## 2015-04-08 ENCOUNTER — Telehealth: Payer: Self-pay

## 2015-04-08 MED ORDER — IBRUTINIB 140 MG PO CAPS: 280.0000 mg | ORAL_CAPSULE | Freq: Every day | ORAL | Status: DC

## 2015-04-08 NOTE — Telephone Encounter (Signed)
imbruvica refill request to Dr Alvy Bimler

## 2015-04-11 DIAGNOSIS — N189 Chronic kidney disease, unspecified: Secondary | ICD-10-CM | POA: Diagnosis not present

## 2015-04-11 DIAGNOSIS — F0390 Unspecified dementia without behavioral disturbance: Secondary | ICD-10-CM | POA: Diagnosis not present

## 2015-04-11 DIAGNOSIS — C8313 Mantle cell lymphoma, intra-abdominal lymph nodes: Secondary | ICD-10-CM | POA: Diagnosis not present

## 2015-04-11 DIAGNOSIS — D6959 Other secondary thrombocytopenia: Secondary | ICD-10-CM | POA: Diagnosis not present

## 2015-04-11 DIAGNOSIS — S0083XD Contusion of other part of head, subsequent encounter: Secondary | ICD-10-CM | POA: Diagnosis not present

## 2015-04-11 DIAGNOSIS — I129 Hypertensive chronic kidney disease with stage 1 through stage 4 chronic kidney disease, or unspecified chronic kidney disease: Secondary | ICD-10-CM | POA: Diagnosis not present

## 2015-04-12 ENCOUNTER — Encounter: Payer: Self-pay | Admitting: *Deleted

## 2015-04-12 NOTE — Progress Notes (Signed)
RECEIVED A FAX FROM [facility] CONCERNING A CONFIRMATION OF PRESCRIPTION SHIPMENT FOR [facility] ON 04/11/15.

## 2015-04-14 DIAGNOSIS — F0390 Unspecified dementia without behavioral disturbance: Secondary | ICD-10-CM | POA: Diagnosis not present

## 2015-04-14 DIAGNOSIS — S0083XD Contusion of other part of head, subsequent encounter: Secondary | ICD-10-CM | POA: Diagnosis not present

## 2015-04-14 DIAGNOSIS — N189 Chronic kidney disease, unspecified: Secondary | ICD-10-CM | POA: Diagnosis not present

## 2015-04-14 DIAGNOSIS — C8313 Mantle cell lymphoma, intra-abdominal lymph nodes: Secondary | ICD-10-CM | POA: Diagnosis not present

## 2015-04-14 DIAGNOSIS — I129 Hypertensive chronic kidney disease with stage 1 through stage 4 chronic kidney disease, or unspecified chronic kidney disease: Secondary | ICD-10-CM | POA: Diagnosis not present

## 2015-04-14 DIAGNOSIS — D6959 Other secondary thrombocytopenia: Secondary | ICD-10-CM | POA: Diagnosis not present

## 2015-04-19 DIAGNOSIS — N189 Chronic kidney disease, unspecified: Secondary | ICD-10-CM | POA: Diagnosis not present

## 2015-04-19 DIAGNOSIS — C8313 Mantle cell lymphoma, intra-abdominal lymph nodes: Secondary | ICD-10-CM | POA: Diagnosis not present

## 2015-04-19 DIAGNOSIS — S0083XD Contusion of other part of head, subsequent encounter: Secondary | ICD-10-CM | POA: Diagnosis not present

## 2015-04-19 DIAGNOSIS — D6959 Other secondary thrombocytopenia: Secondary | ICD-10-CM | POA: Diagnosis not present

## 2015-04-19 DIAGNOSIS — I129 Hypertensive chronic kidney disease with stage 1 through stage 4 chronic kidney disease, or unspecified chronic kidney disease: Secondary | ICD-10-CM | POA: Diagnosis not present

## 2015-04-19 DIAGNOSIS — F0390 Unspecified dementia without behavioral disturbance: Secondary | ICD-10-CM | POA: Diagnosis not present

## 2015-04-21 DIAGNOSIS — F0390 Unspecified dementia without behavioral disturbance: Secondary | ICD-10-CM | POA: Diagnosis not present

## 2015-04-21 DIAGNOSIS — C8313 Mantle cell lymphoma, intra-abdominal lymph nodes: Secondary | ICD-10-CM | POA: Diagnosis not present

## 2015-04-21 DIAGNOSIS — I129 Hypertensive chronic kidney disease with stage 1 through stage 4 chronic kidney disease, or unspecified chronic kidney disease: Secondary | ICD-10-CM | POA: Diagnosis not present

## 2015-04-21 DIAGNOSIS — S0083XD Contusion of other part of head, subsequent encounter: Secondary | ICD-10-CM | POA: Diagnosis not present

## 2015-04-21 DIAGNOSIS — D6959 Other secondary thrombocytopenia: Secondary | ICD-10-CM | POA: Diagnosis not present

## 2015-04-21 DIAGNOSIS — N189 Chronic kidney disease, unspecified: Secondary | ICD-10-CM | POA: Diagnosis not present

## 2015-04-26 ENCOUNTER — Encounter: Payer: Self-pay | Admitting: Hematology and Oncology

## 2015-04-26 ENCOUNTER — Ambulatory Visit (HOSPITAL_BASED_OUTPATIENT_CLINIC_OR_DEPARTMENT_OTHER): Payer: Medicare Other | Admitting: Hematology and Oncology

## 2015-04-26 ENCOUNTER — Other Ambulatory Visit (HOSPITAL_BASED_OUTPATIENT_CLINIC_OR_DEPARTMENT_OTHER): Payer: Medicare Other

## 2015-04-26 VITALS — BP 140/57 | HR 57 | Temp 98.5°F | Resp 18 | Ht 73.0 in | Wt 171.2 lb

## 2015-04-26 DIAGNOSIS — C8313 Mantle cell lymphoma, intra-abdominal lymph nodes: Secondary | ICD-10-CM

## 2015-04-26 DIAGNOSIS — D6959 Other secondary thrombocytopenia: Secondary | ICD-10-CM | POA: Diagnosis not present

## 2015-04-26 DIAGNOSIS — S0083XD Contusion of other part of head, subsequent encounter: Secondary | ICD-10-CM | POA: Diagnosis not present

## 2015-04-26 DIAGNOSIS — N189 Chronic kidney disease, unspecified: Secondary | ICD-10-CM | POA: Diagnosis not present

## 2015-04-26 DIAGNOSIS — T50905A Adverse effect of unspecified drugs, medicaments and biological substances, initial encounter: Secondary | ICD-10-CM

## 2015-04-26 DIAGNOSIS — I129 Hypertensive chronic kidney disease with stage 1 through stage 4 chronic kidney disease, or unspecified chronic kidney disease: Secondary | ICD-10-CM | POA: Diagnosis not present

## 2015-04-26 DIAGNOSIS — F0390 Unspecified dementia without behavioral disturbance: Secondary | ICD-10-CM | POA: Diagnosis not present

## 2015-04-26 LAB — CBC WITH DIFFERENTIAL/PLATELET
BASO%: 0.8 % (ref 0.0–2.0)
BASOS ABS: 0.1 10*3/uL (ref 0.0–0.1)
EOS ABS: 0.1 10*3/uL (ref 0.0–0.5)
EOS%: 1.2 % (ref 0.0–7.0)
HEMATOCRIT: 40.8 % (ref 38.4–49.9)
HEMOGLOBIN: 13.7 g/dL (ref 13.0–17.1)
LYMPH#: 2.3 10*3/uL (ref 0.9–3.3)
LYMPH%: 35.8 % (ref 14.0–49.0)
MCH: 31.4 pg (ref 27.2–33.4)
MCHC: 33.5 g/dL (ref 32.0–36.0)
MCV: 93.9 fL (ref 79.3–98.0)
MONO#: 0.5 10*3/uL (ref 0.1–0.9)
MONO%: 7.7 % (ref 0.0–14.0)
NEUT%: 54.5 % (ref 39.0–75.0)
NEUTROS ABS: 3.5 10*3/uL (ref 1.5–6.5)
Platelets: 116 10*3/uL — ABNORMAL LOW (ref 140–400)
RBC: 4.35 10*6/uL (ref 4.20–5.82)
RDW: 15.2 % — ABNORMAL HIGH (ref 11.0–14.6)
WBC: 6.5 10*3/uL (ref 4.0–10.3)

## 2015-04-26 LAB — COMPREHENSIVE METABOLIC PANEL (CC13)
ALK PHOS: 91 U/L (ref 40–150)
ALT: 13 U/L (ref 0–55)
AST: 17 U/L (ref 5–34)
Albumin: 3.5 g/dL (ref 3.5–5.0)
Anion Gap: 7 mEq/L (ref 3–11)
BILIRUBIN TOTAL: 1.28 mg/dL — AB (ref 0.20–1.20)
BUN: 17.4 mg/dL (ref 7.0–26.0)
CO2: 28 mEq/L (ref 22–29)
Calcium: 8.1 mg/dL — ABNORMAL LOW (ref 8.4–10.4)
Chloride: 107 mEq/L (ref 98–109)
Creatinine: 1.1 mg/dL (ref 0.7–1.3)
EGFR: 60 mL/min/{1.73_m2} — AB (ref >90)
GLUCOSE: 71 mg/dL (ref 70–140)
POTASSIUM: 4.3 meq/L (ref 3.5–5.1)
SODIUM: 142 meq/L (ref 136–145)
TOTAL PROTEIN: 6.1 g/dL — AB (ref 6.4–8.3)

## 2015-04-26 LAB — LACTATE DEHYDROGENASE (CC13): LDH: 202 U/L (ref 125–245)

## 2015-04-26 NOTE — Progress Notes (Signed)
[facility] OFFICE PROGRESS NOTE  Patient Care Team: Lajean Manes, MD as PCP - General (Internal Medicine)  SUMMARY OF ONCOLOGIC HISTORY:   Mantle cell lymphoma of intra-abdominal lymph nodes   03/04/2012 Initial Diagnosis Mantle cell lymphoma of intra-abdominal lymph nodes   03/18/2015 - 03/21/2015 Hospital Admission He was admitted to the hospital after he was found to have a fall. Chemotherapy was put on hold.   This patient was diagnosed with mantle cell lymphoma in February of 2010. He presented with increasing fatigue and abdominal fullness. CT scan showed splenomegaly, periportal lymphadenopathy, bilateral iliac adenopathy, and a dominant lymph node mass in the right pelvis adjacent to the right external iliac vessels measuring 4.8 x 2.3 cm. Additional right inguinal adenopathy 2.9 x 2.6 cm. Diagnosis established by right inguinal lymph node biopsy done 02/01/2009 which showed a CD5 positive large B-cell lymphoma CD20 positive CD5 and CD10 positive lambda light chain restriction. Bone marrow with atypical nodular lymphoid aggregates. Cytogenetic studies did show a population of cells with the 11;14 translocation consistent with mantle cell lymphoma. He was living in [place] at the time. He was treated with 4 cycles of Cytoxan vincristine prednisone and Rituxan followed by 4 cycles of Cytoxan Novantrone vincristine and prednisone through 07/12/2009. He achieved a complete response.  Most recent survey CT scan done 11/24/2013 showed a single area of slowly enlarging lymphadenopathy involving the right external iliac node compared with 08/26/2013 study. Spleen stable at upper limit of normal size. Based on these results, from February of 2015, he was started on Ibrutinib. On May 2015, repeat CT scan showed near complete response to treatment. In December 2015, repeat CT scan show complete response to treatment INTERVAL HISTORY: Please see below for problem oriented  charting. He returns for further follow-up. He is doing well with reduced Ibrutinib. Denies recent fall. He have chronic bruising. The patient denies any recent signs or symptoms of bleeding such as spontaneous epistaxis, hematuria or hematochezia.   REVIEW OF SYSTEMS:   Constitutional: Denies fevers, chills or abnormal weight loss Eyes: Denies blurriness of vision Ears, nose, mouth, throat, and face: Denies mucositis or sore throat Respiratory: Denies cough, dyspnea or wheezes Cardiovascular: Denies palpitation, chest discomfort or lower extremity swelling Gastrointestinal:  Denies nausea, heartburn or change in bowel habits Skin: Denies abnormal skin rashes Lymphatics: Denies new lymphadenopathy  Neurological:Denies numbness, tingling or new weaknesses Behavioral/Psych: Mood is stable, no new changes  All other systems were reviewed with the patient and are negative.  I have reviewed the past medical history, past surgical history, social history and family history with the patient and they are unchanged from previous note.  ALLERGIES:  is allergic to aricept and zosyn.  MEDICATIONS:  Current Outpatient Prescriptions  Medication Sig Dispense Refill  . aspirin 81 MG tablet Take 81 mg by mouth daily.    Marland Kitchen ibrutinib (IMBRUVICA) 140 MG capsul Take 2 capsules (280 mg total) by mouth daily. 60 capsule 0  . Multiple Vitamins-Minerals (EYE-VITE PLUS LUTEIN) CAPS Take 1 capsule by mouth 2 (two) times daily.    Marland Kitchen NAMENDA XR 28 MG CP24 24 hr capsule Take 28 mg by mouth daily.     Marland Kitchen omeprazole (PRILOSEC) 20 MG capsule Take 20 mg by mouth daily.     No current facility-administered medications for this visit.    PHYSICAL EXAMINATION: ECOG PERFORMANCE STATUS: 0 - Asymptomatic  Filed Vitals:   04/26/15 1208  BP: 140/57  Pulse: 57  Temp: 98.5 F (  36.9 C)  Resp: 18   Filed Weights   04/26/15 1208  Weight: 171 lb 3 oz (77.65 kg)    GENERAL:alert, no distress and  comfortable SKIN: skin color, texture, turgor are normal, no rashes or significant lesions. Noted skin bruises EYES: normal, Conjunctiva are pink and non-injected, sclera clear Musculoskeletal:no cyanosis of digits and no clubbing  NEURO: alert & oriented x 3 with fluent speech, no focal motor/sensory deficits  LABORATORY DATA:  I have reviewed the data as listed    Component Value Date/Time   NA 142 04/26/2015 1156   NA 142 03/21/2015 0445   NA 145 02/27/2012 1158   K 4.3 04/26/2015 1156   K 3.4* 03/21/2015 0445   K 4.4 02/27/2012 1158   CL 111 03/21/2015 0445   CL 103 02/24/2013 0923   CL 102 02/27/2012 1158   CO2 28 04/26/2015 1156   CO2 21 03/21/2015 0445   CO2 30 02/27/2012 1158   GLUCOSE 71 04/26/2015 1156   GLUCOSE 97 03/21/2015 0445   GLUCOSE 98 02/24/2013 0923   GLUCOSE 96 02/27/2012 1158   BUN 17.4 04/26/2015 1156   BUN 9 03/21/2015 0445   BUN 14 02/27/2012 1158   CREATININE 1.1 04/26/2015 1156   CREATININE 1.32 03/21/2015 0445   CREATININE 1.0 02/27/2012 1158   CALCIUM 8.1* 04/26/2015 1156   CALCIUM 7.3* 03/21/2015 0445   CALCIUM 8.7 02/27/2012 1158   PROT 6.1* 04/26/2015 1156   PROT 5.0* 03/21/2015 0445   PROT 6.8 02/27/2012 1158   ALBUMIN 3.5 04/26/2015 1156   ALBUMIN 2.7* 03/21/2015 0445   AST 17 04/26/2015 1156   AST 84* 03/21/2015 0445   AST 33 02/27/2012 1158   ALT 13 04/26/2015 1156   ALT 66* 03/21/2015 0445   ALT 26 02/27/2012 1158   ALKPHOS 91 04/26/2015 1156   ALKPHOS 57 03/21/2015 0445   ALKPHOS 105* 02/27/2012 1158   BILITOT 1.28* 04/26/2015 1156   BILITOT 1.4* 03/21/2015 0445   BILITOT 1.10 02/27/2012 1158   GFRNONAA 47* 03/21/2015 0445   GFRAA 55* 03/21/2015 0445    No results found for: SPEP, UPEP  Lab Results  Component Value Date   WBC 6.5 04/26/2015   NEUTROABS 3.5 04/26/2015   HGB 13.7 04/26/2015   HCT 40.8 04/26/2015   MCV 93.9 04/26/2015   PLT 116* 04/26/2015      Chemistry      Component Value Date/Time   NA 142  04/26/2015 1156   NA 142 03/21/2015 0445   NA 145 02/27/2012 1158   K 4.3 04/26/2015 1156   K 3.4* 03/21/2015 0445   K 4.4 02/27/2012 1158   CL 111 03/21/2015 0445   CL 103 02/24/2013 0923   CL 102 02/27/2012 1158   CO2 28 04/26/2015 1156   CO2 21 03/21/2015 0445   CO2 30 02/27/2012 1158   BUN 17.4 04/26/2015 1156   BUN 9 03/21/2015 0445   BUN 14 02/27/2012 1158   CREATININE 1.1 04/26/2015 1156   CREATININE 1.32 03/21/2015 0445   CREATININE 1.0 02/27/2012 1158      Component Value Date/Time   CALCIUM 8.1* 04/26/2015 1156   CALCIUM 7.3* 03/21/2015 0445   CALCIUM 8.7 02/27/2012 1158   ALKPHOS 91 04/26/2015 1156   ALKPHOS 57 03/21/2015 0445   ALKPHOS 105* 02/27/2012 1158   AST 17 04/26/2015 1156   AST 84* 03/21/2015 0445   AST 33 02/27/2012 1158   ALT 13 04/26/2015 1156   ALT 66* 03/21/2015 0445  ALT 26 02/27/2012 1158   BILITOT 1.28* 04/26/2015 1156   BILITOT 1.4* 03/21/2015 0445   BILITOT 1.10 02/27/2012 1158      ASSESSMENT & PLAN:  Mantle cell lymphoma of intra-abdominal lymph nodes His last imaging study show excellent response to treatment. His next imaging is not for another 2 months. He is tolerating reduced dose treatment better. Continue at 2 tablets daily. He will have repeat imaging study in July as scheduled.   Thrombocytopenia due to drugs This has improved since we reduced the dose of treatment. I continued the same. He has very mild bruising which is related to side effects of chemotherapy. However, he has no bleeding. Continue aspirin therapy.    No orders of the defined types were placed in this encounter.   All questions were answered. The patient knows to call the clinic with any problems, questions or concerns. No barriers to learning was detected. I spent 15 minutes counseling the patient face to face. The total time spent in the appointment was 20 minutes and more than 50% was on counseling and review of test results     [facility], [place],  MD 04/26/2015 2:54 PM

## 2015-04-26 NOTE — Assessment & Plan Note (Signed)
This has improved since we reduced the dose of treatment. I continued the same. He has very mild bruising which is related to side effects of chemotherapy. However, he has no bleeding. Continue aspirin therapy.

## 2015-04-26 NOTE — Assessment & Plan Note (Signed)
His last imaging study show excellent response to treatment. His next imaging is not for another 2 months. He is tolerating reduced dose treatment better. Continue at 2 tablets daily. He will have repeat imaging study in July as scheduled.

## 2015-04-28 DIAGNOSIS — C8313 Mantle cell lymphoma, intra-abdominal lymph nodes: Secondary | ICD-10-CM | POA: Diagnosis not present

## 2015-04-28 DIAGNOSIS — S0083XD Contusion of other part of head, subsequent encounter: Secondary | ICD-10-CM | POA: Diagnosis not present

## 2015-04-28 DIAGNOSIS — D6959 Other secondary thrombocytopenia: Secondary | ICD-10-CM | POA: Diagnosis not present

## 2015-04-28 DIAGNOSIS — N189 Chronic kidney disease, unspecified: Secondary | ICD-10-CM | POA: Diagnosis not present

## 2015-04-28 DIAGNOSIS — F0390 Unspecified dementia without behavioral disturbance: Secondary | ICD-10-CM | POA: Diagnosis not present

## 2015-04-28 DIAGNOSIS — I129 Hypertensive chronic kidney disease with stage 1 through stage 4 chronic kidney disease, or unspecified chronic kidney disease: Secondary | ICD-10-CM | POA: Diagnosis not present

## 2015-05-10 ENCOUNTER — Other Ambulatory Visit: Payer: Self-pay | Admitting: *Deleted

## 2015-05-10 MED ORDER — IBRUTINIB 140 MG PO CAPS: 280.0000 mg | ORAL_CAPSULE | Freq: Every day | ORAL | Status: DC

## 2015-05-11 NOTE — Telephone Encounter (Signed)
[facility] faxed confirmation of facsimile receipt for Imbruvica prescription referral.  [facility] will verify insurance and make delivery arrangements with patient.

## 2015-05-12 NOTE — Telephone Encounter (Signed)
[facility] sent facsimile confirmation of Imbrivica prescription shipment.  Imbrivica was shipped on 05-11-2015 with next business day delivery.

## 2015-06-08 ENCOUNTER — Encounter (HOSPITAL_COMMUNITY): Payer: Self-pay

## 2015-06-08 ENCOUNTER — Ambulatory Visit (HOSPITAL_COMMUNITY)
Admission: RE | Admit: 2015-06-08 | Discharge: 2015-06-08 | Disposition: A | Discharge status: Home / Self Care | Payer: Medicare Other | Source: Ambulatory Visit | Attending: Hematology and Oncology | Admitting: Hematology and Oncology

## 2015-06-08 ENCOUNTER — Other Ambulatory Visit (HOSPITAL_BASED_OUTPATIENT_CLINIC_OR_DEPARTMENT_OTHER): Payer: Medicare Other

## 2015-06-08 DIAGNOSIS — C8313 Mantle cell lymphoma, intra-abdominal lymph nodes: Secondary | ICD-10-CM

## 2015-06-08 DIAGNOSIS — Z79899 Other long term (current) drug therapy: Secondary | ICD-10-CM | POA: Insufficient documentation

## 2015-06-08 DIAGNOSIS — R531 Weakness: Secondary | ICD-10-CM | POA: Insufficient documentation

## 2015-06-08 DIAGNOSIS — I2510 Atherosclerotic heart disease of native coronary artery without angina pectoris: Secondary | ICD-10-CM | POA: Insufficient documentation

## 2015-06-08 DIAGNOSIS — C8310 Mantle cell lymphoma, unspecified site: Secondary | ICD-10-CM | POA: Diagnosis not present

## 2015-06-08 DIAGNOSIS — R410 Disorientation, unspecified: Secondary | ICD-10-CM | POA: Diagnosis not present

## 2015-06-08 DIAGNOSIS — K4020 Bilateral inguinal hernia, without obstruction or gangrene, not specified as recurrent: Secondary | ICD-10-CM | POA: Diagnosis not present

## 2015-06-08 DIAGNOSIS — K4090 Unilateral inguinal hernia, without obstruction or gangrene, not specified as recurrent: Secondary | ICD-10-CM | POA: Diagnosis not present

## 2015-06-08 LAB — CBC WITH DIFFERENTIAL/PLATELET
BASO%: 0.5 % (ref 0.0–2.0)
BASOS ABS: 0 10*3/uL (ref 0.0–0.1)
EOS ABS: 0.1 10*3/uL (ref 0.0–0.5)
EOS%: 1.5 % (ref 0.0–7.0)
HCT: 45.9 % (ref 38.4–49.9)
HGB: 16.1 g/dL (ref 13.0–17.1)
LYMPH#: 2 10*3/uL (ref 0.9–3.3)
LYMPH%: 33.1 % (ref 14.0–49.0)
MCH: 32.1 pg (ref 27.2–33.4)
MCHC: 35.1 g/dL (ref 32.0–36.0)
MCV: 91.6 fL (ref 79.3–98.0)
MONO#: 0.5 10*3/uL (ref 0.1–0.9)
MONO%: 7.5 % (ref 0.0–14.0)
NEUT%: 57.4 % (ref 39.0–75.0)
NEUTROS ABS: 3.5 10*3/uL (ref 1.5–6.5)
Platelets: 84 10*3/uL — ABNORMAL LOW (ref 140–400)
RBC: 5.01 10*6/uL (ref 4.20–5.82)
RDW: 13.9 % (ref 11.0–14.6)
WBC: 6 10*3/uL (ref 4.0–10.3)

## 2015-06-08 LAB — COMPREHENSIVE METABOLIC PANEL (CC13)
ALT: 18 U/L (ref 0–55)
ANION GAP: 7 meq/L (ref 3–11)
AST: 21 U/L (ref 5–34)
Albumin: 3.9 g/dL (ref 3.5–5.0)
Alkaline Phosphatase: 86 U/L (ref 40–150)
BILIRUBIN TOTAL: 1.32 mg/dL — AB (ref 0.20–1.20)
BUN: 19.5 mg/dL (ref 7.0–26.0)
CO2: 27 mEq/L (ref 22–29)
CREATININE: 1.1 mg/dL (ref 0.7–1.3)
Calcium: 9 mg/dL (ref 8.4–10.4)
Chloride: 108 mEq/L (ref 98–109)
EGFR: 62 mL/min/{1.73_m2} — ABNORMAL LOW (ref >90)
GLUCOSE: 77 mg/dL (ref 70–140)
Potassium: 4.4 mEq/L (ref 3.5–5.1)
Sodium: 143 mEq/L (ref 136–145)
Total Protein: 6.5 g/dL (ref 6.4–8.3)

## 2015-06-08 LAB — LACTATE DEHYDROGENASE (CC13): LDH: 199 U/L (ref 125–245)

## 2015-06-08 MED ORDER — IOHEXOL 300 MG/ML  SOLN
100.0000 mL | Freq: Once | INTRAMUSCULAR | Status: AC | PRN
  Administered 2015-06-08: 100 mL via INTRAVENOUS

## 2015-06-09 ENCOUNTER — Encounter: Payer: Self-pay | Admitting: Hematology and Oncology

## 2015-06-09 ENCOUNTER — Telehealth: Payer: Self-pay | Admitting: Hematology and Oncology

## 2015-06-09 ENCOUNTER — Ambulatory Visit (HOSPITAL_BASED_OUTPATIENT_CLINIC_OR_DEPARTMENT_OTHER): Payer: Medicare Other | Admitting: Hematology and Oncology

## 2015-06-09 ENCOUNTER — Other Ambulatory Visit: Payer: Medicare Other

## 2015-06-09 ENCOUNTER — Telehealth: Payer: Self-pay

## 2015-06-09 VITALS — BP 156/63 | HR 54 | Temp 97.5°F | Resp 19 | Ht 74.0 in | Wt 169.4 lb

## 2015-06-09 DIAGNOSIS — I10 Essential (primary) hypertension: Secondary | ICD-10-CM

## 2015-06-09 DIAGNOSIS — C8313 Mantle cell lymphoma, intra-abdominal lymph nodes: Secondary | ICD-10-CM

## 2015-06-09 DIAGNOSIS — F0390 Unspecified dementia without behavioral disturbance: Secondary | ICD-10-CM | POA: Diagnosis not present

## 2015-06-09 DIAGNOSIS — F03B0 Unspecified dementia, moderate, without behavioral disturbance, psychotic disturbance, mood disturbance, and anxiety: Secondary | ICD-10-CM

## 2015-06-09 DIAGNOSIS — D6959 Other secondary thrombocytopenia: Secondary | ICD-10-CM

## 2015-06-09 DIAGNOSIS — T50905A Adverse effect of unspecified drugs, medicaments and biological substances, initial encounter: Secondary | ICD-10-CM

## 2015-06-09 MED ORDER — IBRUTINIB 140 MG PO CAPS: 280.0000 mg | ORAL_CAPSULE | Freq: Every day | ORAL | Status: DC

## 2015-06-09 NOTE — Telephone Encounter (Signed)
per pof to sch pt appt-gave pt copy of avs °

## 2015-06-09 NOTE — Assessment & Plan Note (Addendum)
He is not symptomatic. We'll recheck it again next visit. If it remains elevated, he may need to be started on blood pressure medication.

## 2015-06-09 NOTE — Progress Notes (Signed)
[place] OFFICE PROGRESS NOTE  Patient Care Team: Lajean Manes, MD as PCP - General (Internal Medicine)  SUMMARY OF ONCOLOGIC HISTORY:   Mantle cell lymphoma of intra-abdominal lymph nodes   03/04/2012 Initial Diagnosis Mantle cell lymphoma of intra-abdominal lymph nodes   03/18/2015 - 03/21/2015 Hospital Admission He was admitted to the hospital after he was found to have a fall. Chemotherapy was put on hold.   06/08/2015 Imaging Repeat CT scan showed complete remission    This patient was diagnosed with mantle cell lymphoma in February of 2010. He presented with increasing fatigue and abdominal fullness. CT scan showed splenomegaly, periportal lymphadenopathy, bilateral iliac adenopathy, and a dominant lymph node mass in the right pelvis adjacent to the right external iliac vessels measuring 4.8 x 2.3 cm. Additional right inguinal adenopathy 2.9 x 2.6 cm. Diagnosis established by right inguinal lymph node biopsy done 02/01/2009 which showed a CD5 positive large B-cell lymphoma CD20 positive CD5 and CD10 positive lambda light chain restriction. Bone marrow with atypical nodular lymphoid aggregates. Cytogenetic studies did show a population of cells with the 11;14 translocation consistent with mantle cell lymphoma. He was living in [place] at the time. He was treated with 4 cycles of Cytoxan vincristine prednisone and Rituxan followed by 4 cycles of Cytoxan Novantrone vincristine and prednisone through 07/12/2009. He achieved a complete response.  Most recent survey CT scan done 11/24/2013 showed a single area of slowly enlarging lymphadenopathy involving the right external iliac node compared with 08/26/2013 study. Spleen stable at upper limit of normal size. Based on these results, from February of 2015, he was started on Ibrutinib. On May 2015, repeat CT scan showed near complete response to treatment. In December 2015, repeat CT scan show complete response to  treatment INTERVAL HISTORY: Please see below for problem oriented charting. He feels well. Tolerated treatment well without any side effects. He had very minor bruising. The patient denies any recent signs or symptoms of bleeding such as spontaneous epistaxis, hematuria or hematochezia.   REVIEW OF SYSTEMS:   Constitutional: Denies fevers, chills or abnormal weight loss Eyes: Denies blurriness of vision Ears, nose, mouth, throat, and face: Denies mucositis or sore throat Respiratory: Denies cough, dyspnea or wheezes Cardiovascular: Denies palpitation, chest discomfort or lower extremity swelling Gastrointestinal:  Denies nausea, heartburn or change in bowel habits Skin: Denies abnormal skin rashes Lymphatics: Denies new lymphadenopathy  Neurological:Denies numbness, tingling or new weaknesses Behavioral/Psych: Mood is stable, no new changes  All other systems were reviewed with the patient and are negative.  I have reviewed the past medical history, past surgical history, social history and family history with the patient and they are unchanged from previous note.  ALLERGIES:  is allergic to aricept and zosyn.  MEDICATIONS:  Current Outpatient Prescriptions  Medication Sig Dispense Refill  . aspirin 81 MG tablet Take 81 mg by mouth daily.    Marland Kitchen ibrutinib (IMBRUVICA) 140 MG capsul Take 2 capsules (280 mg total) by mouth daily. 60 capsule 6  . Multiple Vitamins-Minerals (EYE-VITE PLUS LUTEIN) CAPS Take 1 capsule by mouth 2 (two) times daily.    Marland Kitchen NAMENDA XR 28 MG CP24 24 hr capsule Take 28 mg by mouth daily.     Marland Kitchen omeprazole (PRILOSEC) 20 MG capsule Take 20 mg by mouth daily.     No current facility-administered medications for this visit.    PHYSICAL EXAMINATION: ECOG PERFORMANCE STATUS: 0 - Asymptomatic  Filed Vitals:   06/09/15 1056  BP: 156/63  Pulse: 54  Temp: 97.5 F (36.4 C)  Resp: 19   Filed Weights   06/09/15 1056  Weight: 169 lb 6.4 oz (76.839 kg)     GENERAL:alert, no distress and comfortable SKIN: skin color, texture, turgor are normal, no rashes or significant lesions EYES: normal, Conjunctiva are pink and non-injected, sclera clear Musculoskeletal:no cyanosis of digits and no clubbing  NEURO: alert & oriented x 3 with fluent speech, no focal motor/sensory deficits  LABORATORY DATA:  I have reviewed the data as listed    Component Value Date/Time   NA 143 06/08/2015 0945   NA 142 03/21/2015 0445   NA 145 02/27/2012 1158   K 4.4 06/08/2015 0945   K 3.4* 03/21/2015 0445   K 4.4 02/27/2012 1158   CL 111 03/21/2015 0445   CL 103 02/24/2013 0923   CL 102 02/27/2012 1158   CO2 27 06/08/2015 0945   CO2 21 03/21/2015 0445   CO2 30 02/27/2012 1158   GLUCOSE 77 06/08/2015 0945   GLUCOSE 97 03/21/2015 0445   GLUCOSE 98 02/24/2013 0923   GLUCOSE 96 02/27/2012 1158   BUN 19.5 06/08/2015 0945   BUN 9 03/21/2015 0445   BUN 14 02/27/2012 1158   CREATININE 1.1 06/08/2015 0945   CREATININE 1.32 03/21/2015 0445   CREATININE 1.0 02/27/2012 1158   CALCIUM 9.0 06/08/2015 0945   CALCIUM 7.3* 03/21/2015 0445   CALCIUM 8.7 02/27/2012 1158   PROT 6.5 06/08/2015 0945   PROT 5.0* 03/21/2015 0445   PROT 6.8 02/27/2012 1158   ALBUMIN 3.9 06/08/2015 0945   ALBUMIN 2.7* 03/21/2015 0445   AST 21 06/08/2015 0945   AST 84* 03/21/2015 0445   AST 33 02/27/2012 1158   ALT 18 06/08/2015 0945   ALT 66* 03/21/2015 0445   ALT 26 02/27/2012 1158   ALKPHOS 86 06/08/2015 0945   ALKPHOS 57 03/21/2015 0445   ALKPHOS 105* 02/27/2012 1158   BILITOT 1.32* 06/08/2015 0945   BILITOT 1.4* 03/21/2015 0445   BILITOT 1.10 02/27/2012 1158   GFRNONAA 47* 03/21/2015 0445   GFRAA 55* 03/21/2015 0445    No results found for: SPEP, UPEP  Lab Results  Component Value Date   WBC 6.0 06/08/2015   NEUTROABS 3.5 06/08/2015   HGB 16.1 06/08/2015   HCT 45.9 06/08/2015   MCV 91.6 06/08/2015   PLT 84* 06/08/2015      Chemistry      Component Value  Date/Time   NA 143 06/08/2015 0945   NA 142 03/21/2015 0445   NA 145 02/27/2012 1158   K 4.4 06/08/2015 0945   K 3.4* 03/21/2015 0445   K 4.4 02/27/2012 1158   CL 111 03/21/2015 0445   CL 103 02/24/2013 0923   CL 102 02/27/2012 1158   CO2 27 06/08/2015 0945   CO2 21 03/21/2015 0445   CO2 30 02/27/2012 1158   BUN 19.5 06/08/2015 0945   BUN 9 03/21/2015 0445   BUN 14 02/27/2012 1158   CREATININE 1.1 06/08/2015 0945   CREATININE 1.32 03/21/2015 0445   CREATININE 1.0 02/27/2012 1158      Component Value Date/Time   CALCIUM 9.0 06/08/2015 0945   CALCIUM 7.3* 03/21/2015 0445   CALCIUM 8.7 02/27/2012 1158   ALKPHOS 86 06/08/2015 0945   ALKPHOS 57 03/21/2015 0445   ALKPHOS 105* 02/27/2012 1158   AST 21 06/08/2015 0945   AST 84* 03/21/2015 0445   AST 33 02/27/2012 1158   ALT 18 06/08/2015 0945   ALT 66*  03/21/2015 0445   ALT 26 02/27/2012 1158   BILITOT 1.32* 06/08/2015 0945   BILITOT 1.4* 03/21/2015 0445   BILITOT 1.10 02/27/2012 1158       RADIOGRAPHIC STUDIES: I have personally reviewed the radiological images as listed and agreed with the findings in the report. Ct Chest W Contrast  06/08/2015   CLINICAL DATA:  Mantle cell lymphoma with recurrence December 2015. Ongoing chemotherapy. Weakness and confusion.  EXAM: CT CHEST, ABDOMEN, AND PELVIS WITH CONTRAST  TECHNIQUE: Multidetector CT imaging of the chest, abdomen and pelvis was performed following the standard protocol during bolus administration of intravenous contrast.  CONTRAST:  127mL OMNIPAQUE IOHEXOL 300 MG/ML  SOLN  COMPARISON:  11/01/2014.  FINDINGS: CT CHEST FINDINGS  Mediastinum/Nodes: Right thyroidectomy. Tiny low-attenuation nodules are seen in the left lobe of the thyroid. No pathologically enlarged mediastinal, hilar or axillary lymph nodes. Heart size normal. Coronary artery calcification. No pericardial effusion. Moderate hiatal hernia.  Lungs/Pleura: Scattered millimetric nodular densities along the fissures  are unchanged and are likely subpleural lymph nodes. Minimal dependent atelectasis bilaterally. Lungs are otherwise clear. No pleural fluid. Airway is unremarkable.  Musculoskeletal: No worrisome lytic or sclerotic lesions. Degenerative changes are seen in the spine.  CT ABDOMEN AND PELVIS FINDINGS  Hepatobiliary: Low-attenuation lesions in the liver measure up to 1.8 cm, stable. Liver may be slightly decreased in attenuation diffusely. Gallbladder is unremarkable. No biliary ductal dilatation.  Pancreas: Negative.  Spleen: Negative.  Adrenals/Urinary Tract: Adrenal glands are unremarkable. Low-attenuation lesions in the kidneys measure up to 12 mm on the left, stable and statistically, likely cysts. Ureters are decompressed. Bladder is grossly unremarkable.  Stomach/Bowel: Moderate hiatal hernia again noted. Stomach, small bowel and appendix are otherwise unremarkable. Moderate amount of stool in the colon, suggesting constipation. Colon and rectum are otherwise unremarkable.  Vascular/Lymphatic: Atherosclerotic calcification of the arterial vasculature without abdominal aortic aneurysm. No pathologically enlarged lymph nodes. Scattered lymph nodes along the iliac chains are sub cm in size.  Reproductive: Prostate is visualized.  Other: Small bilateral inguinal hernias contain fat.  No free fluid.  Musculoskeletal: No worrisome lytic or sclerotic lesions. Degenerative changes are seen in the spine.  IMPRESSION: 1. No evidence of recurrent lymphoma. 2. Coronary artery calcification. 3. Liver appears fatty. 4. Small bilateral inguinal hernias contain fat.   Electronically Signed   By: Lorin Picket M.D.   On: 06/08/2015 12:11   Ct Abdomen Pelvis W Contrast  06/08/2015   CLINICAL DATA:  Mantle cell lymphoma with recurrence December 2015. Ongoing chemotherapy. Weakness and confusion.  EXAM: CT CHEST, ABDOMEN, AND PELVIS WITH CONTRAST  TECHNIQUE: Multidetector CT imaging of the chest, abdomen and pelvis was  performed following the standard protocol during bolus administration of intravenous contrast.  CONTRAST:  167mL OMNIPAQUE IOHEXOL 300 MG/ML  SOLN  COMPARISON:  11/01/2014.  FINDINGS: CT CHEST FINDINGS  Mediastinum/Nodes: Right thyroidectomy. Tiny low-attenuation nodules are seen in the left lobe of the thyroid. No pathologically enlarged mediastinal, hilar or axillary lymph nodes. Heart size normal. Coronary artery calcification. No pericardial effusion. Moderate hiatal hernia.  Lungs/Pleura: Scattered millimetric nodular densities along the fissures are unchanged and are likely subpleural lymph nodes. Minimal dependent atelectasis bilaterally. Lungs are otherwise clear. No pleural fluid. Airway is unremarkable.  Musculoskeletal: No worrisome lytic or sclerotic lesions. Degenerative changes are seen in the spine.  CT ABDOMEN AND PELVIS FINDINGS  Hepatobiliary: Low-attenuation lesions in the liver measure up to 1.8 cm, stable. Liver may be slightly decreased in attenuation diffusely. Gallbladder  is unremarkable. No biliary ductal dilatation.  Pancreas: Negative.  Spleen: Negative.  Adrenals/Urinary Tract: Adrenal glands are unremarkable. Low-attenuation lesions in the kidneys measure up to 12 mm on the left, stable and statistically, likely cysts. Ureters are decompressed. Bladder is grossly unremarkable.  Stomach/Bowel: Moderate hiatal hernia again noted. Stomach, small bowel and appendix are otherwise unremarkable. Moderate amount of stool in the colon, suggesting constipation. Colon and rectum are otherwise unremarkable.  Vascular/Lymphatic: Atherosclerotic calcification of the arterial vasculature without abdominal aortic aneurysm. No pathologically enlarged lymph nodes. Scattered lymph nodes along the iliac chains are sub cm in size.  Reproductive: Prostate is visualized.  Other: Small bilateral inguinal hernias contain fat.  No free fluid.  Musculoskeletal: No worrisome lytic or sclerotic lesions. Degenerative  changes are seen in the spine.  IMPRESSION: 1. No evidence of recurrent lymphoma. 2. Coronary artery calcification. 3. Liver appears fatty. 4. Small bilateral inguinal hernias contain fat.   Electronically Signed   By: Lorin Picket M.D.   On: 06/08/2015 12:11     ASSESSMENT & PLAN:  Mantle cell lymphoma of intra-abdominal lymph nodes His last imaging study showed no evidence of disease. He is tolerating reduced dose treatment better. Continue at 2 tablets daily. He will have repeat imaging study next year. We see him back in 4 months for repeat history, physical examination and blood work.    Thrombocytopenia due to drugs He has very mild bruising which is related to side effects of chemotherapy. However, he has no bleeding. Continue aspirin therapy.    Moderate dementia without behavioral disturbance He appears to be functioning well. He will continue his medication for this.     Essential hypertension He is not symptomatic. We'll recheck it again next visit. If it remains elevated, he may need to be started on blood pressure medication.   No orders of the defined types were placed in this encounter.   All questions were answered. The patient knows to call the clinic with any problems, questions or concerns. No barriers to learning was detected. I spent 15 minutes counseling the patient face to face. The total time spent in the appointment was 20 minutes and more than 50% was on counseling and review of test results     [facility], Sadhana Frater, MD 06/09/2015 11:20 AM

## 2015-06-09 NOTE — Telephone Encounter (Signed)
Fax biologics received our referral

## 2015-06-09 NOTE — Assessment & Plan Note (Signed)
He has very mild bruising which is related to side effects of chemotherapy. However, he has no bleeding. Continue aspirin therapy.

## 2015-06-09 NOTE — Assessment & Plan Note (Signed)
His last imaging study showed no evidence of disease. He is tolerating reduced dose treatment better. Continue at 2 tablets daily. He will have repeat imaging study next year. We see him back in 4 months for repeat history, physical examination and blood work.

## 2015-06-09 NOTE — Assessment & Plan Note (Signed)
He appears to be functioning well. He will continue his medication for this.  

## 2015-06-30 DIAGNOSIS — H1132 Conjunctival hemorrhage, left eye: Secondary | ICD-10-CM | POA: Diagnosis not present

## 2015-07-20 DIAGNOSIS — H3531 Nonexudative age-related macular degeneration: Secondary | ICD-10-CM | POA: Diagnosis not present

## 2015-07-21 ENCOUNTER — Telehealth: Payer: Self-pay | Admitting: *Deleted

## 2015-07-21 NOTE — Telephone Encounter (Signed)
Wife called to give new address

## 2015-08-17 ENCOUNTER — Encounter: Payer: Self-pay | Admitting: Hematology and Oncology

## 2015-08-17 NOTE — Progress Notes (Signed)
Per [facility]ologics imbruvica has been shipped via fedex

## 2015-09-09 ENCOUNTER — Encounter: Payer: Self-pay | Admitting: Hematology and Oncology

## 2015-09-09 NOTE — Progress Notes (Signed)
Per [facility] imbruvica was shipped via fedex

## 2015-09-27 DIAGNOSIS — C8590 Non-Hodgkin lymphoma, unspecified, unspecified site: Secondary | ICD-10-CM | POA: Diagnosis not present

## 2015-09-27 DIAGNOSIS — Z0000 Encounter for general adult medical examination without abnormal findings: Secondary | ICD-10-CM | POA: Diagnosis not present

## 2015-09-27 DIAGNOSIS — G301 Alzheimer's disease with late onset: Secondary | ICD-10-CM | POA: Diagnosis not present

## 2015-09-27 DIAGNOSIS — Z1389 Encounter for screening for other disorder: Secondary | ICD-10-CM | POA: Diagnosis not present

## 2015-10-13 ENCOUNTER — Telehealth: Payer: Self-pay | Admitting: Hematology and Oncology

## 2015-10-13 ENCOUNTER — Other Ambulatory Visit: Payer: Self-pay | Admitting: Hematology and Oncology

## 2015-10-13 ENCOUNTER — Other Ambulatory Visit (HOSPITAL_BASED_OUTPATIENT_CLINIC_OR_DEPARTMENT_OTHER): Payer: Medicare Other

## 2015-10-13 ENCOUNTER — Ambulatory Visit (HOSPITAL_BASED_OUTPATIENT_CLINIC_OR_DEPARTMENT_OTHER): Payer: Medicare Other | Admitting: Hematology and Oncology

## 2015-10-13 ENCOUNTER — Encounter: Payer: Self-pay | Admitting: Hematology and Oncology

## 2015-10-13 VITALS — BP 179/58 | HR 60 | Temp 97.6°F | Resp 18 | Ht 74.0 in | Wt 170.7 lb

## 2015-10-13 DIAGNOSIS — C8313 Mantle cell lymphoma, intra-abdominal lymph nodes: Secondary | ICD-10-CM | POA: Diagnosis not present

## 2015-10-13 DIAGNOSIS — D696 Thrombocytopenia, unspecified: Secondary | ICD-10-CM | POA: Diagnosis not present

## 2015-10-13 DIAGNOSIS — I10 Essential (primary) hypertension: Secondary | ICD-10-CM

## 2015-10-13 LAB — CBC WITH DIFFERENTIAL/PLATELET
BASO%: 0.3 % (ref 0.0–2.0)
BASOS ABS: 0 10*3/uL (ref 0.0–0.1)
EOS%: 1 % (ref 0.0–7.0)
Eosinophils Absolute: 0.1 10*3/uL (ref 0.0–0.5)
HCT: 41.2 % (ref 38.4–49.9)
HGB: 14.1 g/dL (ref 13.0–17.1)
LYMPH%: 22.4 % (ref 14.0–49.0)
MCH: 31.3 pg (ref 27.2–33.4)
MCHC: 34.2 g/dL (ref 32.0–36.0)
MCV: 91.4 fL (ref 79.3–98.0)
MONO#: 0.4 10*3/uL (ref 0.1–0.9)
MONO%: 6.4 % (ref 0.0–14.0)
NEUT%: 69.9 % (ref 39.0–75.0)
NEUTROS ABS: 4.2 10*3/uL (ref 1.5–6.5)
Platelets: 81 10*3/uL — ABNORMAL LOW (ref 140–400)
RBC: 4.51 10*6/uL (ref 4.20–5.82)
RDW: 14 % (ref 11.0–14.6)
WBC: 6 10*3/uL (ref 4.0–10.3)
lymph#: 1.3 10*3/uL (ref 0.9–3.3)

## 2015-10-13 LAB — COMPREHENSIVE METABOLIC PANEL (CC13)
ALT: 16 U/L (ref 0–55)
AST: 19 U/L (ref 5–34)
Albumin: 3.5 g/dL (ref 3.5–5.0)
Alkaline Phosphatase: 87 U/L (ref 40–150)
Anion Gap: 6 mEq/L (ref 3–11)
BILIRUBIN TOTAL: 1.15 mg/dL (ref 0.20–1.20)
BUN: 19 mg/dL (ref 7.0–26.0)
CHLORIDE: 109 meq/L (ref 98–109)
CO2: 28 meq/L (ref 22–29)
CREATININE: 1.3 mg/dL (ref 0.7–1.3)
Calcium: 8.5 mg/dL (ref 8.4–10.4)
EGFR: 51 mL/min/{1.73_m2} — ABNORMAL LOW (ref >90)
GLUCOSE: 112 mg/dL (ref 70–140)
Potassium: 4.1 mEq/L (ref 3.5–5.1)
SODIUM: 143 meq/L (ref 136–145)
TOTAL PROTEIN: 5.5 g/dL — AB (ref 6.4–8.3)

## 2015-10-13 LAB — LACTATE DEHYDROGENASE (CC13): LDH: 231 U/L (ref 125–245)

## 2015-10-13 NOTE — Telephone Encounter (Signed)
per pof to sch pt appt-gave pt copy of avs °

## 2015-10-13 NOTE — Assessment & Plan Note (Signed)
This is likely due to recent treatment. The patient denies recent history of bleeding such as epistaxis, hematuria or hematochezia. He is asymptomatic from the low platelet count. I will observe for now.  he does not require transfusion now. I will continue the chemotherapy at current dose without dosage adjustment.  If the thrombocytopenia gets progressive worse in the future, I might have to delay his treatment or adjust the chemotherapy dose.   

## 2015-10-13 NOTE — Assessment & Plan Note (Signed)
He is not symptomatic. I recommend close follow-up with primary care doctor for review

## 2015-10-13 NOTE — Progress Notes (Signed)
[place] OFFICE PROGRESS NOTE  Patient Care Team: Lajean Manes, MD as PCP - General (Internal Medicine)  SUMMARY OF ONCOLOGIC HISTORY:   Mantle cell lymphoma of intra-abdominal lymph nodes ([place])   03/04/2012 Initial Diagnosis Mantle cell lymphoma of intra-abdominal lymph nodes   03/18/2015 - 03/21/2015 Hospital Admission He was admitted to the hospital after he was found to have a fall. Chemotherapy was put on hold.   06/08/2015 Imaging Repeat CT scan showed complete remission    INTERVAL HISTORY: Please see below for problem oriented charting. He returns for further follow-up. Denies recent infection. No new lymphadenopathy. The patient denies any recent signs or symptoms of bleeding such as spontaneous epistaxis, hematuria or hematochezia.  However, his wife noted occasional spots blood in his underwear. That happens very rarely.  REVIEW OF SYSTEMS:   Constitutional: Denies fevers, chills or abnormal weight loss Eyes: Denies blurriness of vision Ears, nose, mouth, throat, and face: Denies mucositis or sore throat Respiratory: Denies cough, dyspnea or wheezes Cardiovascular: Denies palpitation, chest discomfort or lower extremity swelling Gastrointestinal:  Denies nausea, heartburn or change in bowel habits Skin: Denies abnormal skin rashes Lymphatics: Denies new lymphadenopathy or easy bruising Neurological:Denies numbness, tingling or new weaknesses Behavioral/Psych: Mood is stable, no new changes  All other systems were reviewed with the patient and are negative.  I have reviewed the past medical history, past surgical history, social history and family history with the patient and they are unchanged from previous note.  ALLERGIES:  is allergic to aricept and zosyn.  MEDICATIONS:  Current Outpatient Prescriptions  Medication Sig Dispense Refill  . aspirin 81 MG tablet Take 81 mg by mouth daily.    Marland Kitchen ibrutinib (IMBRUVICA) 140 MG capsul Take 2 capsules (280  mg total) by mouth daily. 60 capsule 6  . Multiple Vitamins-Minerals (EYE-VITE PLUS LUTEIN) CAPS Take 1 capsule by mouth 2 (two) times daily.    Marland Kitchen NAMENDA XR 28 MG CP24 24 hr capsule Take 28 mg by mouth daily.     Marland Kitchen omeprazole (PRILOSEC) 20 MG capsule Take 20 mg by mouth daily.     No current facility-administered medications for this visit.    PHYSICAL EXAMINATION: ECOG PERFORMANCE STATUS: 0 - Asymptomatic  Filed Vitals:   10/13/15 1033  BP: 179/58  Pulse:   Temp:   Resp:    Filed Weights   10/13/15 1005  Weight: 170 lb 11.2 oz (77.429 kg)    GENERAL:alert, no distress and comfortable SKIN: skin color, texture, turgor are normal, no rashes or significant lesions EYES: normal, Conjunctiva are pink and non-injected, sclera clear OROPHARYNX:no exudate, no erythema and lips, buccal mucosa, and tongue normal  NECK: supple, thyroid normal size, non-tender, without nodularity LYMPH:  no palpable lymphadenopathy in the cervical, axillary or inguinal LUNGS: clear to auscultation and percussion with normal breathing effort HEART: regular rate & rhythm and no murmurs and no lower extremity edema ABDOMEN:abdomen soft, non-tender and normal bowel sounds Musculoskeletal:no cyanosis of digits and no clubbing  NEURO: alert & oriented x 3 with fluent speech, no focal motor/sensory deficits  LABORATORY DATA:  I have reviewed the data as listed    Component Value Date/Time   NA 143 10/13/2015 0954   NA 142 03/21/2015 0445   NA 145 02/27/2012 1158   K 4.1 10/13/2015 0954   K 3.4* 03/21/2015 0445   K 4.4 02/27/2012 1158   CL 111 03/21/2015 0445   CL 103 02/24/2013 0923   CL 102 02/27/2012 1158  CO2 28 10/13/2015 0954   CO2 21 03/21/2015 0445   CO2 30 02/27/2012 1158   GLUCOSE 112 10/13/2015 0954   GLUCOSE 97 03/21/2015 0445   GLUCOSE 98 02/24/2013 0923   GLUCOSE 96 02/27/2012 1158   BUN 19.0 10/13/2015 0954   BUN 9 03/21/2015 0445   BUN 14 02/27/2012 1158   CREATININE 1.3  10/13/2015 0954   CREATININE 1.32 03/21/2015 0445   CREATININE 1.0 02/27/2012 1158   CALCIUM 8.5 10/13/2015 0954   CALCIUM 7.3* 03/21/2015 0445   CALCIUM 8.7 02/27/2012 1158   PROT 5.5* 10/13/2015 0954   PROT 5.0* 03/21/2015 0445   PROT 6.8 02/27/2012 1158   ALBUMIN 3.5 10/13/2015 0954   ALBUMIN 2.7* 03/21/2015 0445   ALBUMIN 3.7 02/27/2012 1158   AST 19 10/13/2015 0954   AST 84* 03/21/2015 0445   AST 33 02/27/2012 1158   ALT 16 10/13/2015 0954   ALT 66* 03/21/2015 0445   ALT 26 02/27/2012 1158   ALKPHOS 87 10/13/2015 0954   ALKPHOS 57 03/21/2015 0445   ALKPHOS 105* 02/27/2012 1158   BILITOT 1.15 10/13/2015 0954   BILITOT 1.4* 03/21/2015 0445   BILITOT 1.10 02/27/2012 1158   GFRNONAA 47* 03/21/2015 0445   GFRAA 55* 03/21/2015 0445    No results found for: SPEP, UPEP  Lab Results  Component Value Date   WBC 6.0 10/13/2015   NEUTROABS 4.2 10/13/2015   HGB 14.1 10/13/2015   HCT 41.2 10/13/2015   MCV 91.4 10/13/2015   PLT 81* 10/13/2015      Chemistry      Component Value Date/Time   NA 143 10/13/2015 0954   NA 142 03/21/2015 0445   NA 145 02/27/2012 1158   K 4.1 10/13/2015 0954   K 3.4* 03/21/2015 0445   K 4.4 02/27/2012 1158   CL 111 03/21/2015 0445   CL 103 02/24/2013 0923   CL 102 02/27/2012 1158   CO2 28 10/13/2015 0954   CO2 21 03/21/2015 0445   CO2 30 02/27/2012 1158   BUN 19.0 10/13/2015 0954   BUN 9 03/21/2015 0445   BUN 14 02/27/2012 1158   CREATININE 1.3 10/13/2015 0954   CREATININE 1.32 03/21/2015 0445   CREATININE 1.0 02/27/2012 1158      Component Value Date/Time   CALCIUM 8.5 10/13/2015 0954   CALCIUM 7.3* 03/21/2015 0445   CALCIUM 8.7 02/27/2012 1158   ALKPHOS 87 10/13/2015 0954   ALKPHOS 57 03/21/2015 0445   ALKPHOS 105* 02/27/2012 1158   AST 19 10/13/2015 0954   AST 84* 03/21/2015 0445   AST 33 02/27/2012 1158   ALT 16 10/13/2015 0954   ALT 66* 03/21/2015 0445   ALT 26 02/27/2012 1158   BILITOT 1.15 10/13/2015 0954   BILITOT  1.4* 03/21/2015 0445   BILITOT 1.10 02/27/2012 1158      ASSESSMENT & PLAN:  Mantle cell lymphoma of intra-abdominal lymph nodes His last imaging study showed no evidence of disease. He is tolerating reduced dose treatment better. Continue at 2 tablets daily. He will have repeat imaging study next year. We see him back in 4 months for repeat history, physical examination and blood work.   Thrombocytopenia ([place]) This is likely due to recent treatment. The patient denies recent history of bleeding such as epistaxis, hematuria or hematochezia. He is asymptomatic from the low platelet count. I will observe for now.  he does not require transfusion now. I will continue the chemotherapy at current dose without dosage adjustment.  If the  thrombocytopenia gets progressive worse in the future, I might have to delay his treatment or adjust the chemotherapy dose.    Essential hypertension He is not symptomatic. I recommend close follow-up with primary care doctor for review   Orders Placed This Encounter  Procedures  . CBC with Differential/Platelet    Standing Status: Future     Number of Occurrences:      Standing Expiration Date: 11/16/2016  . Comprehensive metabolic panel    Standing Status: Future     Number of Occurrences:      Standing Expiration Date: 11/16/2016   All questions were answered. The patient knows to call the clinic with any problems, questions or concerns. No barriers to learning was detected. I spent 15 minutes counseling the patient face to face. The total time spent in the appointment was 20 minutes and more than 50% was on counseling and review of test results     [facility], [place], MD 10/13/2015 11:21 AM

## 2015-10-13 NOTE — Assessment & Plan Note (Signed)
His last imaging study showed no evidence of disease. He is tolerating reduced dose treatment better. Continue at 2 tablets daily. He will have repeat imaging study next year. We see him back in 4 months for repeat history, physical examination and blood work.

## 2015-10-19 DIAGNOSIS — D696 Thrombocytopenia, unspecified: Secondary | ICD-10-CM | POA: Diagnosis not present

## 2015-10-19 DIAGNOSIS — I10 Essential (primary) hypertension: Secondary | ICD-10-CM | POA: Diagnosis not present

## 2015-10-19 DIAGNOSIS — R319 Hematuria, unspecified: Secondary | ICD-10-CM | POA: Diagnosis not present

## 2015-10-19 DIAGNOSIS — H1131 Conjunctival hemorrhage, right eye: Secondary | ICD-10-CM | POA: Diagnosis not present

## 2015-12-09 DIAGNOSIS — I10 Essential (primary) hypertension: Secondary | ICD-10-CM | POA: Diagnosis not present

## 2015-12-12 ENCOUNTER — Encounter: Payer: Self-pay | Admitting: Hematology and Oncology

## 2015-12-12 DIAGNOSIS — N138 Other obstructive and reflux uropathy: Secondary | ICD-10-CM | POA: Diagnosis not present

## 2015-12-12 DIAGNOSIS — R3912 Poor urinary stream: Secondary | ICD-10-CM | POA: Diagnosis not present

## 2015-12-12 DIAGNOSIS — N401 Enlarged prostate with lower urinary tract symptoms: Secondary | ICD-10-CM | POA: Diagnosis not present

## 2015-12-12 DIAGNOSIS — R3121 Asymptomatic microscopic hematuria: Secondary | ICD-10-CM | POA: Diagnosis not present

## 2015-12-12 NOTE — Progress Notes (Signed)
Per biologics imbruvica was shipped via fedex 12/09/15

## 2016-01-10 ENCOUNTER — Telehealth: Payer: Self-pay

## 2016-01-10 ENCOUNTER — Other Ambulatory Visit: Payer: Self-pay | Admitting: *Deleted

## 2016-01-10 MED ORDER — IBRUTINIB 140 MG PO CAPS: 280.0000 mg | ORAL_CAPSULE | Freq: Every day | ORAL | Status: DC

## 2016-01-10 NOTE — Telephone Encounter (Signed)
Dr Alvy Bimler had recommeneded f/u with urologist. Jeffrey Simmons Dr Roni Bread on Jan 16. And he said to f/u sooner rather than later at [facility]. It has been a month. He was supposed to have sent the OV notes to [facility]. Pt does have 3/16 appt but wife did not know if he should be seen before then.

## 2016-01-11 ENCOUNTER — Telehealth: Payer: Self-pay | Admitting: *Deleted

## 2016-01-11 NOTE — Telephone Encounter (Signed)
Pls request urology notes and I will decide if his appt needs to be moved

## 2016-01-11 NOTE — Telephone Encounter (Signed)
Notes received from [facility] and placed on Dr. Calton Dach desk for her review.

## 2016-01-12 ENCOUNTER — Other Ambulatory Visit: Payer: Self-pay | Admitting: Hematology and Oncology

## 2016-01-12 ENCOUNTER — Telehealth: Payer: Self-pay | Admitting: *Deleted

## 2016-01-12 ENCOUNTER — Telehealth: Payer: Self-pay | Admitting: Hematology and Oncology

## 2016-01-12 DIAGNOSIS — C8313 Mantle cell lymphoma, intra-abdominal lymph nodes: Secondary | ICD-10-CM

## 2016-01-12 NOTE — Telephone Encounter (Signed)
per pof to sch pt appt-cld pt and adv of appt time & date-adv need contrast to come by priot to 3/14

## 2016-01-12 NOTE — Telephone Encounter (Signed)
Informed wife of new order for CT scan to be done prior to next office visit.  She should expect call from Radiology Scheduler. She verbalized understanding.

## 2016-01-13 ENCOUNTER — Other Ambulatory Visit: Payer: Self-pay | Admitting: *Deleted

## 2016-01-17 ENCOUNTER — Telehealth: Payer: Self-pay | Admitting: Hematology and Oncology

## 2016-01-17 NOTE — Telephone Encounter (Signed)
Per 2/17 pof moved 3/14 lab closer to ct. Moved lab to 11 am, left message for patient and mailed new schedule.

## 2016-01-23 ENCOUNTER — Telehealth: Payer: Self-pay | Admitting: *Deleted

## 2016-01-23 NOTE — Telephone Encounter (Signed)
Spouse [name] called requesting "Dr. Alvy Bimler complete application for [facility] Imbruvica.  Has enough medication to last through Saturday.  Biologics called me because [facility] Does not have any funding.  I will receive a form and Dr. Alvy Bimler needs to complete a form to obtain alternative funding for the Imbruvica for his mantle cell Lymphoma."  Will notify Managed Care and Oral chemotherapy Pharmacist.

## 2016-02-07 ENCOUNTER — Other Ambulatory Visit (HOSPITAL_BASED_OUTPATIENT_CLINIC_OR_DEPARTMENT_OTHER): Payer: Medicare Other

## 2016-02-07 ENCOUNTER — Encounter (HOSPITAL_COMMUNITY): Payer: Self-pay

## 2016-02-07 ENCOUNTER — Ambulatory Visit (HOSPITAL_COMMUNITY)
Admission: RE | Admit: 2016-02-07 | Discharge: 2016-02-07 | Disposition: A | Discharge status: Home / Self Care | Payer: Medicare Other | Source: Ambulatory Visit | Attending: Hematology and Oncology | Admitting: Hematology and Oncology

## 2016-02-07 DIAGNOSIS — C8590 Non-Hodgkin lymphoma, unspecified, unspecified site: Secondary | ICD-10-CM | POA: Diagnosis not present

## 2016-02-07 DIAGNOSIS — C8313 Mantle cell lymphoma, intra-abdominal lymph nodes: Secondary | ICD-10-CM | POA: Diagnosis not present

## 2016-02-07 LAB — COMPREHENSIVE METABOLIC PANEL
ALBUMIN: 3.9 g/dL (ref 3.5–5.0)
ALK PHOS: 100 U/L (ref 40–150)
ALT: 27 U/L (ref 0–55)
ANION GAP: 8 meq/L (ref 3–11)
AST: 27 U/L (ref 5–34)
BUN: 20.8 mg/dL (ref 7.0–26.0)
CALCIUM: 9.1 mg/dL (ref 8.4–10.4)
CO2: 28 meq/L (ref 22–29)
Chloride: 107 mEq/L (ref 98–109)
Creatinine: 1.5 mg/dL — ABNORMAL HIGH (ref 0.7–1.3)
EGFR: 41 mL/min/{1.73_m2} — AB (ref >90)
GLUCOSE: 88 mg/dL (ref 70–140)
POTASSIUM: 4.5 meq/L (ref 3.5–5.1)
Sodium: 142 mEq/L (ref 136–145)
Total Bilirubin: 1.28 mg/dL — ABNORMAL HIGH (ref 0.20–1.20)
Total Protein: 6.4 g/dL (ref 6.4–8.3)

## 2016-02-07 LAB — CBC WITH DIFFERENTIAL/PLATELET
BASO%: 0.9 % (ref 0.0–2.0)
BASOS ABS: 0.1 10*3/uL (ref 0.0–0.1)
EOS ABS: 0.1 10*3/uL (ref 0.0–0.5)
EOS%: 1.5 % (ref 0.0–7.0)
HEMATOCRIT: 42.9 % (ref 38.4–49.9)
HEMOGLOBIN: 14.3 g/dL (ref 13.0–17.1)
LYMPH#: 1.7 10*3/uL (ref 0.9–3.3)
LYMPH%: 26.1 % (ref 14.0–49.0)
MCH: 31 pg (ref 27.2–33.4)
MCHC: 33.3 g/dL (ref 32.0–36.0)
MCV: 93.1 fL (ref 79.3–98.0)
MONO#: 0.5 10*3/uL (ref 0.1–0.9)
MONO%: 7.4 % (ref 0.0–14.0)
NEUT#: 4.1 10*3/uL (ref 1.5–6.5)
NEUT%: 64.1 % (ref 39.0–75.0)
Platelets: 104 10*3/uL — ABNORMAL LOW (ref 140–400)
RBC: 4.61 10*6/uL (ref 4.20–5.82)
RDW: 13.6 % (ref 11.0–14.6)
WBC: 6.3 10*3/uL (ref 4.0–10.3)

## 2016-02-07 MED ORDER — IOHEXOL 300 MG/ML  SOLN
100.0000 mL | Freq: Once | INTRAMUSCULAR | Status: AC | PRN
  Administered 2016-02-07: 100 mL via INTRAVENOUS

## 2016-02-09 ENCOUNTER — Encounter: Payer: Self-pay | Admitting: Hematology and Oncology

## 2016-02-09 ENCOUNTER — Ambulatory Visit (HOSPITAL_BASED_OUTPATIENT_CLINIC_OR_DEPARTMENT_OTHER): Payer: Medicare Other | Admitting: Hematology and Oncology

## 2016-02-09 ENCOUNTER — Other Ambulatory Visit: Payer: Medicare Other

## 2016-02-09 ENCOUNTER — Telehealth: Payer: Self-pay | Admitting: Hematology and Oncology

## 2016-02-09 VITALS — BP 174/70 | HR 57 | Temp 97.5°F | Resp 17 | Wt 173.0 lb

## 2016-02-09 DIAGNOSIS — D696 Thrombocytopenia, unspecified: Secondary | ICD-10-CM

## 2016-02-09 DIAGNOSIS — C8313 Mantle cell lymphoma, intra-abdominal lymph nodes: Secondary | ICD-10-CM | POA: Diagnosis not present

## 2016-02-09 DIAGNOSIS — F0390 Unspecified dementia without behavioral disturbance: Secondary | ICD-10-CM | POA: Diagnosis not present

## 2016-02-09 DIAGNOSIS — F03B0 Unspecified dementia, moderate, without behavioral disturbance, psychotic disturbance, mood disturbance, and anxiety: Secondary | ICD-10-CM

## 2016-02-09 NOTE — Assessment & Plan Note (Signed)
His last imaging study showed no evidence of disease. He is tolerating reduced dose treatment better. Continue at 2 tablets daily. We see him back in 6 months for repeat history, physical examination and blood work.

## 2016-02-09 NOTE — Assessment & Plan Note (Signed)
This is likely due to recent treatment. The patient denies recent history of bleeding such as epistaxis, hematuria or hematochezia. He is asymptomatic from the low platelet count. I will observe for now.  he does not require transfusion now. I will continue the chemotherapy at current dose without dosage adjustment.  If the thrombocytopenia gets progressive worse in the future, I might have to delay his treatment or adjust the chemotherapy dose.   

## 2016-02-09 NOTE — Telephone Encounter (Signed)
Gave and printed appt sched and avs for pt for SEpt °

## 2016-02-09 NOTE — Progress Notes (Signed)
[facility] OFFICE PROGRESS NOTE  Patient Care Team: Lajean Manes, MD as PCP - General (Internal Medicine) Irine Seal, MD as Consulting Physician (Urology)  SUMMARY OF ONCOLOGIC HISTORY:   Mantle cell lymphoma of intra-abdominal lymph nodes ([place])   03/04/2012 Initial Diagnosis Mantle cell lymphoma of intra-abdominal lymph nodes   03/18/2015 - 03/21/2015 Hospital Admission He was admitted to the hospital after he was found to have a fall. Chemotherapy was put on hold.   06/08/2015 Imaging Repeat CT scan showed complete remission   02/08/2016 Imaging CT imaging showed complete response/remission    INTERVAL HISTORY: Please see below for problem oriented charting. He feels well. Denies recent infection. He has difficulties with high copayment for his medication recently. Denies new lymphadenopathy The patient denies any recent signs or symptoms of bleeding such as spontaneous epistaxis, hematuria or hematochezia.   REVIEW OF SYSTEMS:   Constitutional: Denies fevers, chills or abnormal weight loss Eyes: Denies blurriness of vision Ears, nose, mouth, throat, and face: Denies mucositis or sore throat Respiratory: Denies cough, dyspnea or wheezes Cardiovascular: Denies palpitation, chest discomfort or lower extremity swelling Gastrointestinal:  Denies nausea, heartburn or change in bowel habits Skin: Denies abnormal skin rashes Lymphatics: Denies new lymphadenopathy or easy bruising Neurological:Denies numbness, tingling or new weaknesses Behavioral/Psych: Mood is stable, no new changes  All other systems were reviewed with the patient and are negative.  I have reviewed the past medical history, past surgical history, social history and family history with the patient and they are unchanged from previous note.  ALLERGIES:  is allergic to aricept and zosyn.  MEDICATIONS:  Current Outpatient Prescriptions  Medication Sig Dispense Refill  . amLODipine (NORVASC) 2.5 MG  tablet Take 2.5 mg by mouth daily.    Marland Kitchen aspirin 81 MG tablet Take 81 mg by mouth daily.    Marland Kitchen ibrutinib (IMBRUVICA) 140 MG capsul Take 2 capsules (280 mg total) by mouth daily. 60 capsule 6  . Multiple Vitamins-Minerals (EYE-VITE PLUS LUTEIN) CAPS Take 1 capsule by mouth 2 (two) times daily.    Marland Kitchen NAMENDA XR 28 MG CP24 24 hr capsule Take 28 mg by mouth daily.     Marland Kitchen omeprazole (PRILOSEC) 20 MG capsule Take 20 mg by mouth daily.     No current facility-administered medications for this visit.    PHYSICAL EXAMINATION: ECOG PERFORMANCE STATUS: 0 - Asymptomatic  Filed Vitals:   02/09/16 0956  BP: 174/70  Pulse: 57  Temp: 97.5 F (36.4 C)  Resp: 17   Filed Weights   02/09/16 0956  Weight: 173 lb (78.472 kg)    GENERAL:alert, no distress and comfortable SKIN: skin color, texture, turgor are normal, no rashes or significant lesions EYES: normal, Conjunctiva are pink and non-injected, sclera clear OROPHARYNX:no exudate, no erythema and lips, buccal mucosa, and tongue normal  NECK: supple, thyroid normal size, non-tender, without nodularity LYMPH:  no palpable lymphadenopathy in the cervical, axillary or inguinal LUNGS: clear to auscultation and percussion with normal breathing effort HEART: regular rate & rhythm and no murmurs and no lower extremity edema ABDOMEN:abdomen soft, non-tender and normal bowel sounds Musculoskeletal:no cyanosis of digits and no clubbing  NEURO: alert & oriented x 3 with fluent speech, no focal motor/sensory deficits  LABORATORY DATA:  I have reviewed the data as listed    Component Value Date/Time   NA 142 02/07/2016 1028   NA 142 03/21/2015 0445   NA 145 02/27/2012 1158   K 4.5 02/07/2016 1028   K 3.4*  03/21/2015 0445   K 4.4 02/27/2012 1158   CL 111 03/21/2015 0445   CL 103 02/24/2013 0923   CL 102 02/27/2012 1158   CO2 28 02/07/2016 1028   CO2 21 03/21/2015 0445   CO2 30 02/27/2012 1158   GLUCOSE 88 02/07/2016 1028   GLUCOSE 97 03/21/2015  0445   GLUCOSE 98 02/24/2013 0923   GLUCOSE 96 02/27/2012 1158   BUN 20.8 02/07/2016 1028   BUN 9 03/21/2015 0445   BUN 14 02/27/2012 1158   CREATININE 1.5* 02/07/2016 1028   CREATININE 1.32 03/21/2015 0445   CREATININE 1.0 02/27/2012 1158   CALCIUM 9.1 02/07/2016 1028   CALCIUM 7.3* 03/21/2015 0445   CALCIUM 8.7 02/27/2012 1158   PROT 6.4 02/07/2016 1028   PROT 5.0* 03/21/2015 0445   PROT 6.8 02/27/2012 1158   ALBUMIN 3.9 02/07/2016 1028   ALBUMIN 2.7* 03/21/2015 0445   ALBUMIN 3.7 02/27/2012 1158   AST 27 02/07/2016 1028   AST 84* 03/21/2015 0445   AST 33 02/27/2012 1158   ALT 27 02/07/2016 1028   ALT 66* 03/21/2015 0445   ALT 26 02/27/2012 1158   ALKPHOS 100 02/07/2016 1028   ALKPHOS 57 03/21/2015 0445   ALKPHOS 105* 02/27/2012 1158   BILITOT 1.28* 02/07/2016 1028   BILITOT 1.4* 03/21/2015 0445   BILITOT 1.10 02/27/2012 1158   GFRNONAA 47* 03/21/2015 0445   GFRAA 55* 03/21/2015 0445    No results found for: SPEP, UPEP  Lab Results  Component Value Date   WBC 6.3 02/07/2016   NEUTROABS 4.1 02/07/2016   HGB 14.3 02/07/2016   HCT 42.9 02/07/2016   MCV 93.1 02/07/2016   PLT 104* 02/07/2016      Chemistry      Component Value Date/Time   NA 142 02/07/2016 1028   NA 142 03/21/2015 0445   NA 145 02/27/2012 1158   K 4.5 02/07/2016 1028   K 3.4* 03/21/2015 0445   K 4.4 02/27/2012 1158   CL 111 03/21/2015 0445   CL 103 02/24/2013 0923   CL 102 02/27/2012 1158   CO2 28 02/07/2016 1028   CO2 21 03/21/2015 0445   CO2 30 02/27/2012 1158   BUN 20.8 02/07/2016 1028   BUN 9 03/21/2015 0445   BUN 14 02/27/2012 1158   CREATININE 1.5* 02/07/2016 1028   CREATININE 1.32 03/21/2015 0445   CREATININE 1.0 02/27/2012 1158      Component Value Date/Time   CALCIUM 9.1 02/07/2016 1028   CALCIUM 7.3* 03/21/2015 0445   CALCIUM 8.7 02/27/2012 1158   ALKPHOS 100 02/07/2016 1028   ALKPHOS 57 03/21/2015 0445   ALKPHOS 105* 02/27/2012 1158   AST 27 02/07/2016 1028   AST 84*  03/21/2015 0445   AST 33 02/27/2012 1158   ALT 27 02/07/2016 1028   ALT 66* 03/21/2015 0445   ALT 26 02/27/2012 1158   BILITOT 1.28* 02/07/2016 1028   BILITOT 1.4* 03/21/2015 0445   BILITOT 1.10 02/27/2012 1158       RADIOGRAPHIC STUDIES: I have personally reviewed the radiological images as listed and agreed with the findings in the report. Ct Chest W Contrast  02/07/2016  CLINICAL DATA:  Mantle cell lymphoma with intra-abdominal lymph nodes EXAM: CT CHEST, ABDOMEN, AND PELVIS WITH CONTRAST TECHNIQUE: Multidetector CT imaging of the chest, abdomen and pelvis was performed following the standard protocol during bolus administration of intravenous contrast. CONTRAST:  158mL OMNIPAQUE IOHEXOL 300 MG/ML  SOLN COMPARISON:  06/08/2015 FINDINGS: CT CHEST FINDINGS Mediastinum/Nodes: The  heart is normal in size. No pericardial effusion. Mild coronary atherosclerosis in the left main coronary artery and right coronary artery. Mild atherosclerotic calcifications of the aortic arch. No suspicious mediastinal, hilar, or axillary lymphadenopathy. Visualized left thyroid is mildly heterogeneous/ nodular. Lungs/Pleura: Lungs are clear. No suspicious pulmonary nodules. No focal consolidation. Mild left apical pleural parenchymal scarring. No pleural effusion or pneumothorax. Musculoskeletal: Degenerative changes of the thoracic spine. CT ABDOMEN PELVIS FINDINGS Hepatobiliary: Liver is notable for a 1.6 cm cyst in the posterior segment right hepatic lobe (series 2/ image 51), mildly decreased. Vague hyperenhancing lesion inferiorly in the right hepatic lobe (series 2/image 60), which normalizes on the delayed phase, favored to reflect a subtle perfusion anomaly. Gallbladder is notable for tiny dependent gallstones (series 2/image 59). No associated inflammatory changes. Pancreas: Within normal limits. Spleen: Within normal limits. Adrenals/Urinary Tract: Adrenal glands are within normal limits. 13 mm cyst in the  lateral interpolar left kidney (series 5/ image 12). Right kidney is within normal limits. No hydronephrosis. Bladder is within normal limits. Stomach/Bowel: Stomach is notable for a small hiatal hernia. No evidence of bowel obstruction. Normal appendix (series 2/ image 95). Colonic diverticulosis, without evidence of diverticulitis. Vascular/Lymphatic: Atherosclerotic calcifications of the abdominal aorta and branch vessels. No evidence of abdominal aortic aneurysm. No suspicious abdominopelvic lymphadenopathy. 8 mm short axis left external iliac node (series 2/ image 98) and 10 mm short axis right deep inguinal node (series 2/ image 110), within normal limits. Reproductive: Prostate is grossly unremarkable. Other: No abdominopelvic ascites. Small fat containing bilateral inguinal hernias (series 2/ image 114). Musculoskeletal: Mild degenerative changes of the lumbar spine, most prominent at L4-5. IMPRESSION: No findings suspicious for active lymphomatous involvement. Stable ancillary findings as above. Electronically Signed   By: Julian Hy M.D.   On: 02/07/2016 14:13   Ct Abdomen Pelvis W Contrast  02/07/2016  CLINICAL DATA:  Mantle cell lymphoma with intra-abdominal lymph nodes EXAM: CT CHEST, ABDOMEN, AND PELVIS WITH CONTRAST TECHNIQUE: Multidetector CT imaging of the chest, abdomen and pelvis was performed following the standard protocol during bolus administration of intravenous contrast. CONTRAST:  167mL OMNIPAQUE IOHEXOL 300 MG/ML  SOLN COMPARISON:  06/08/2015 FINDINGS: CT CHEST FINDINGS Mediastinum/Nodes: The heart is normal in size. No pericardial effusion. Mild coronary atherosclerosis in the left main coronary artery and right coronary artery. Mild atherosclerotic calcifications of the aortic arch. No suspicious mediastinal, hilar, or axillary lymphadenopathy. Visualized left thyroid is mildly heterogeneous/ nodular. Lungs/Pleura: Lungs are clear. No suspicious pulmonary nodules. No focal  consolidation. Mild left apical pleural parenchymal scarring. No pleural effusion or pneumothorax. Musculoskeletal: Degenerative changes of the thoracic spine. CT ABDOMEN PELVIS FINDINGS Hepatobiliary: Liver is notable for a 1.6 cm cyst in the posterior segment right hepatic lobe (series 2/ image 51), mildly decreased. Vague hyperenhancing lesion inferiorly in the right hepatic lobe (series 2/image 60), which normalizes on the delayed phase, favored to reflect a subtle perfusion anomaly. Gallbladder is notable for tiny dependent gallstones (series 2/image 59). No associated inflammatory changes. Pancreas: Within normal limits. Spleen: Within normal limits. Adrenals/Urinary Tract: Adrenal glands are within normal limits. 13 mm cyst in the lateral interpolar left kidney (series 5/ image 12). Right kidney is within normal limits. No hydronephrosis. Bladder is within normal limits. Stomach/Bowel: Stomach is notable for a small hiatal hernia. No evidence of bowel obstruction. Normal appendix (series 2/ image 95). Colonic diverticulosis, without evidence of diverticulitis. Vascular/Lymphatic: Atherosclerotic calcifications of the abdominal aorta and branch vessels. No evidence of abdominal aortic aneurysm.  No suspicious abdominopelvic lymphadenopathy. 8 mm short axis left external iliac node (series 2/ image 98) and 10 mm short axis right deep inguinal node (series 2/ image 110), within normal limits. Reproductive: Prostate is grossly unremarkable. Other: No abdominopelvic ascites. Small fat containing bilateral inguinal hernias (series 2/ image 114). Musculoskeletal: Mild degenerative changes of the lumbar spine, most prominent at L4-5. IMPRESSION: No findings suspicious for active lymphomatous involvement. Stable ancillary findings as above. Electronically Signed   By: Julian Hy M.D.   On: 02/07/2016 14:13     ASSESSMENT & PLAN:  Mantle cell lymphoma of intra-abdominal lymph nodes His last imaging study  showed no evidence of disease. He is tolerating reduced dose treatment better. Continue at 2 tablets daily. We see him back in 6 months for repeat history, physical examination and blood work.   Thrombocytopenia ([place]) This is likely due to recent treatment. The patient denies recent history of bleeding such as epistaxis, hematuria or hematochezia. He is asymptomatic from the low platelet count. I will observe for now.  he does not require transfusion now. I will continue the chemotherapy at current dose without dosage adjustment.  If the thrombocytopenia gets progressive worse in the future, I might have to delay his treatment or adjust the chemotherapy dose.    Moderate dementia without behavioral disturbance He appears to be functioning well. He will continue his medication for this.  I will get assistance from our oral chemotherapy navigator to help him find assistance to pay for Ibrutinib.  Orders Placed This Encounter  Procedures  . CBC with Differential/Platelet    Standing Status: Future     Number of Occurrences:      Standing Expiration Date: 03/15/2017  . Comprehensive metabolic panel    Standing Status: Future     Number of Occurrences:      Standing Expiration Date: 03/15/2017  . Lactate dehydrogenase (LDH)    Standing Status: Future     Number of Occurrences:      Standing Expiration Date: 03/15/2017   All questions were answered. The patient knows to call the clinic with any problems, questions or concerns. No barriers to learning was detected. I spent 15 minutes counseling the patient face to face. The total time spent in the appointment was 20 minutes and more than 50% was on counseling and review of test results     [facility], Gregor Dershem, MD 02/09/2016 10:18 AM

## 2016-02-09 NOTE — Assessment & Plan Note (Signed)
He appears to be functioning well. He will continue his medication for this.  

## 2016-02-15 ENCOUNTER — Encounter: Payer: Self-pay | Admitting: Hematology and Oncology

## 2016-02-15 NOTE — Progress Notes (Signed)
Returned Jeffrey Simmons's call regarding copay assistance for Imbruvica.  She stated she hasn't heard anything from [employer] regarding his application.  I called [employer] to check status of the application.  Rep stated that the funding for his Dx is closed.  They are waiting for funding to drop which should be at the end of this week.  Once that happens he will be approved and a letter will be mailed to them.  I relayed that message to Mrs. Jeffrey Simmons.  She verbalized understanding.

## 2016-03-26 ENCOUNTER — Encounter: Payer: Self-pay | Admitting: Hematology and Oncology

## 2016-03-26 NOTE — Progress Notes (Signed)
Per [facility] imbruvica was shipped via [employer] 03/23/16

## 2016-05-07 DIAGNOSIS — H353131 Nonexudative age-related macular degeneration, bilateral, early dry stage: Secondary | ICD-10-CM | POA: Diagnosis not present

## 2016-05-10 DIAGNOSIS — D485 Neoplasm of uncertain behavior of skin: Secondary | ICD-10-CM | POA: Diagnosis not present

## 2016-05-10 DIAGNOSIS — L570 Actinic keratosis: Secondary | ICD-10-CM | POA: Diagnosis not present

## 2016-05-10 DIAGNOSIS — L821 Other seborrheic keratosis: Secondary | ICD-10-CM | POA: Diagnosis not present

## 2016-05-10 DIAGNOSIS — D1801 Hemangioma of skin and subcutaneous tissue: Secondary | ICD-10-CM | POA: Diagnosis not present

## 2016-05-17 ENCOUNTER — Other Ambulatory Visit: Payer: Self-pay | Admitting: Geriatric Medicine

## 2016-05-17 ENCOUNTER — Ambulatory Visit
Admission: RE | Admit: 2016-05-17 | Discharge: 2016-05-17 | Disposition: A | Discharge status: Home / Self Care | Payer: Medicare Other | Source: Ambulatory Visit | Attending: Geriatric Medicine | Admitting: Geriatric Medicine

## 2016-05-17 DIAGNOSIS — R05 Cough: Secondary | ICD-10-CM

## 2016-05-17 DIAGNOSIS — R059 Cough, unspecified: Secondary | ICD-10-CM

## 2016-05-17 DIAGNOSIS — G301 Alzheimer's disease with late onset: Secondary | ICD-10-CM | POA: Diagnosis not present

## 2016-05-17 DIAGNOSIS — I10 Essential (primary) hypertension: Secondary | ICD-10-CM | POA: Diagnosis not present

## 2016-05-17 DIAGNOSIS — I700 Atherosclerosis of aorta: Secondary | ICD-10-CM | POA: Diagnosis not present

## 2016-06-11 DIAGNOSIS — R3121 Asymptomatic microscopic hematuria: Secondary | ICD-10-CM | POA: Diagnosis not present

## 2016-08-09 ENCOUNTER — Telehealth: Payer: Self-pay | Admitting: Hematology and Oncology

## 2016-08-09 ENCOUNTER — Other Ambulatory Visit (HOSPITAL_BASED_OUTPATIENT_CLINIC_OR_DEPARTMENT_OTHER): Payer: Medicare Other

## 2016-08-09 ENCOUNTER — Ambulatory Visit (HOSPITAL_BASED_OUTPATIENT_CLINIC_OR_DEPARTMENT_OTHER): Payer: Medicare Other | Admitting: Hematology and Oncology

## 2016-08-09 ENCOUNTER — Encounter: Payer: Self-pay | Admitting: Hematology and Oncology

## 2016-08-09 VITALS — BP 156/59 | HR 61 | Temp 97.7°F | Resp 17 | Wt 169.7 lb

## 2016-08-09 DIAGNOSIS — I10 Essential (primary) hypertension: Secondary | ICD-10-CM

## 2016-08-09 DIAGNOSIS — D696 Thrombocytopenia, unspecified: Secondary | ICD-10-CM

## 2016-08-09 DIAGNOSIS — C8313 Mantle cell lymphoma, intra-abdominal lymph nodes: Secondary | ICD-10-CM | POA: Diagnosis not present

## 2016-08-09 DIAGNOSIS — F0390 Unspecified dementia without behavioral disturbance: Secondary | ICD-10-CM

## 2016-08-09 DIAGNOSIS — F03B0 Unspecified dementia, moderate, without behavioral disturbance, psychotic disturbance, mood disturbance, and anxiety: Secondary | ICD-10-CM

## 2016-08-09 LAB — CBC WITH DIFFERENTIAL/PLATELET
BASO%: 0.9 % (ref 0.0–2.0)
BASOS ABS: 0 10*3/uL (ref 0.0–0.1)
EOS%: 1.3 % (ref 0.0–7.0)
Eosinophils Absolute: 0.1 10*3/uL (ref 0.0–0.5)
HEMATOCRIT: 39.4 % (ref 38.4–49.9)
HEMOGLOBIN: 13.3 g/dL (ref 13.0–17.1)
LYMPH#: 1.3 10*3/uL (ref 0.9–3.3)
LYMPH%: 24.5 % (ref 14.0–49.0)
MCH: 30.8 pg (ref 27.2–33.4)
MCHC: 33.7 g/dL (ref 32.0–36.0)
MCV: 91.4 fL (ref 79.3–98.0)
MONO#: 0.4 10*3/uL (ref 0.1–0.9)
MONO%: 7 % (ref 0.0–14.0)
NEUT#: 3.5 10*3/uL (ref 1.5–6.5)
NEUT%: 66.3 % (ref 39.0–75.0)
PLATELETS: 90 10*3/uL — AB (ref 140–400)
RBC: 4.31 10*6/uL (ref 4.20–5.82)
RDW: 14.2 % (ref 11.0–14.6)
WBC: 5.3 10*3/uL (ref 4.0–10.3)

## 2016-08-09 LAB — COMPREHENSIVE METABOLIC PANEL
ALBUMIN: 3.3 g/dL — AB (ref 3.5–5.0)
ALK PHOS: 97 U/L (ref 40–150)
ALT: 18 U/L (ref 0–55)
ANION GAP: 9 meq/L (ref 3–11)
AST: 21 U/L (ref 5–34)
BUN: 22.7 mg/dL (ref 7.0–26.0)
CALCIUM: 8.7 mg/dL (ref 8.4–10.4)
CHLORIDE: 108 meq/L (ref 98–109)
CO2: 25 mEq/L (ref 22–29)
CREATININE: 1.5 mg/dL — AB (ref 0.7–1.3)
EGFR: 43 mL/min/{1.73_m2} — ABNORMAL LOW (ref >90)
Glucose: 114 mg/dl (ref 70–140)
Potassium: 4.5 mEq/L (ref 3.5–5.1)
Sodium: 142 mEq/L (ref 136–145)
Total Bilirubin: 1.12 mg/dL (ref 0.20–1.20)
Total Protein: 5.7 g/dL — ABNORMAL LOW (ref 6.4–8.3)

## 2016-08-09 LAB — LACTATE DEHYDROGENASE: LDH: 190 U/L (ref 125–245)

## 2016-08-09 NOTE — Assessment & Plan Note (Signed)
This is likely due to recent treatment. The patient denies recent history of bleeding such as epistaxis, hematuria or hematochezia. He is asymptomatic from the low platelet count. I will observe for now.  he does not require transfusion now. I will continue the chemotherapy at current dose without dosage adjustment.  If the thrombocytopenia gets progressive worse in the future, I might have to delay his treatment or adjust the chemotherapy dose.   

## 2016-08-09 NOTE — Assessment & Plan Note (Signed)
He is not symptomatic. I recommend close follow-up with primary care doctor for medication adjustment

## 2016-08-09 NOTE — Assessment & Plan Note (Signed)
His last imaging study showed no evidence of disease. He is tolerating reduced dose treatment better. Continue at 2 tablets daily. We see him back in 6 months for repeat history, physical examination and blood work, along with CT imaging to exclude disease progression

## 2016-08-09 NOTE — Progress Notes (Signed)
[facility] OFFICE PROGRESS NOTE  Patient Care Team: Lajean Manes, MD as PCP - General (Internal Medicine) Irine Seal, MD as Consulting Physician (Urology)  SUMMARY OF ONCOLOGIC HISTORY:   Mantle cell lymphoma of intra-abdominal lymph nodes ([place])   03/04/2012 Initial Diagnosis    Mantle cell lymphoma of intra-abdominal lymph nodes      03/18/2015 - 03/21/2015 Hospital Admission    He was admitted to the hospital after he was found to have a fall. Chemotherapy was put on hold.      06/08/2015 Imaging    Repeat CT scan showed complete remission      02/08/2016 Imaging    CT imaging showed complete response/remission       INTERVAL HISTORY: Please see below for problem oriented charting. He returns today with his wife. There were no reported side effects. No diarrhea, recent infection of recent falls. The patient denies any recent signs or symptoms of bleeding such as spontaneous epistaxis, hematuria or hematochezia.  REVIEW OF SYSTEMS:   Constitutional: Denies fevers, chills or abnormal weight loss Eyes: Denies blurriness of vision Ears, nose, mouth, throat, and face: Denies mucositis or sore throat Respiratory: Denies cough, dyspnea or wheezes Cardiovascular: Denies palpitation, chest discomfort or lower extremity swelling Gastrointestinal:  Denies nausea, heartburn or change in bowel habits Skin: Denies abnormal skin rashes Lymphatics: Denies new lymphadenopathy or easy bruising Neurological:Denies numbness, tingling or new weaknesses Behavioral/Psych: Mood is stable, no new changes  All other systems were reviewed with the patient and are negative.  I have reviewed the past medical history, past surgical history, social history and family history with the patient and they are unchanged from previous note.  ALLERGIES:  is allergic to aricept [donepezil hcl] and zosyn [piperacillin sod-tazobactam so].  MEDICATIONS:  Current Outpatient Prescriptions   Medication Sig Dispense Refill  . amLODipine (NORVASC) 2.5 MG tablet Take 2.5 mg by mouth daily.    Marland Kitchen aspirin 81 MG tablet Take 81 mg by mouth daily.    Marland Kitchen ibrutinib (IMBRUVICA) 140 MG capsul Take 2 capsules (280 mg total) by mouth daily. 60 capsule 6  . Multiple Vitamins-Minerals (EYE-VITE PLUS LUTEIN) CAPS Take 1 capsule by mouth 2 (two) times daily.    Marland Kitchen NAMENDA XR 28 MG CP24 24 hr capsule Take 28 mg by mouth daily.     Marland Kitchen omeprazole (PRILOSEC) 20 MG capsule Take 20 mg by mouth daily.     No current facility-administered medications for this visit.     PHYSICAL EXAMINATION: ECOG PERFORMANCE STATUS: 0 - Asymptomatic  Vitals:   08/09/16 0920  BP: (!) 156/59  Pulse: 61  Resp: 17  Temp: 97.7 F (36.5 C)   Filed Weights   08/09/16 0920  Weight: 169 lb 11.2 oz (77 kg)    GENERAL:alert, no distress and comfortable SKIN: skin color, texture, turgor are normal, no rashes or significant lesions EYES: normal, Conjunctiva are pink and non-injected, sclera clear OROPHARYNX:no exudate, no erythema and lips, buccal mucosa, and tongue normal  NECK: supple, thyroid normal size, non-tender, without nodularity LYMPH:  no palpable lymphadenopathy in the cervical, axillary or inguinal LUNGS: clear to auscultation and percussion with normal breathing effort HEART: regular rate & rhythm and no murmurs and no lower extremity edema ABDOMEN:abdomen soft, non-tender and normal bowel sounds Musculoskeletal:no cyanosis of digits and no clubbing  NEURO: alert & oriented x 3 with fluent speech, no focal motor/sensory deficits  LABORATORY DATA:  I have reviewed the data as listed  Component Value Date/Time   NA 142 08/09/2016 0906   K 4.5 08/09/2016 0906   CL 111 03/21/2015 0445   CL 103 02/24/2013 0923   CO2 25 08/09/2016 0906   GLUCOSE 114 08/09/2016 0906   GLUCOSE 98 02/24/2013 0923   BUN 22.7 08/09/2016 0906   CREATININE 1.5 (H) 08/09/2016 0906   CALCIUM 8.7 08/09/2016 0906   PROT 5.7  (L) 08/09/2016 0906   ALBUMIN 3.3 (L) 08/09/2016 0906   AST 21 08/09/2016 0906   ALT 18 08/09/2016 0906   ALKPHOS 97 08/09/2016 0906   BILITOT 1.12 08/09/2016 0906   GFRNONAA 47 (L) 03/21/2015 0445   GFRAA 55 (L) 03/21/2015 0445    No results found for: SPEP, UPEP  Lab Results  Component Value Date   WBC 5.3 08/09/2016   NEUTROABS 3.5 08/09/2016   HGB 13.3 08/09/2016   HCT 39.4 08/09/2016   MCV 91.4 08/09/2016   PLT 90 (L) 08/09/2016      Chemistry      Component Value Date/Time   NA 142 08/09/2016 0906   K 4.5 08/09/2016 0906   CL 111 03/21/2015 0445   CL 103 02/24/2013 0923   CO2 25 08/09/2016 0906   BUN 22.7 08/09/2016 0906   CREATININE 1.5 (H) 08/09/2016 0906      Component Value Date/Time   CALCIUM 8.7 08/09/2016 0906   ALKPHOS 97 08/09/2016 0906   AST 21 08/09/2016 0906   ALT 18 08/09/2016 0906   BILITOT 1.12 08/09/2016 0906      ASSESSMENT & PLAN:  Mantle cell lymphoma of intra-abdominal lymph nodes His last imaging study showed no evidence of disease. He is tolerating reduced dose treatment better. Continue at 2 tablets daily. We see him back in 6 months for repeat history, physical examination and blood work, along with CT imaging to exclude disease progression  Thrombocytopenia ([place]) This is likely due to recent treatment. The patient denies recent history of bleeding such as epistaxis, hematuria or hematochezia. He is asymptomatic from the low platelet count. I will observe for now.  he does not require transfusion now. I will continue the chemotherapy at current dose without dosage adjustment.  If the thrombocytopenia gets progressive worse in the future, I might have to delay his treatment or adjust the chemotherapy dose.  Moderate dementia without behavioral disturbance He appears to be functioning well. He will continue his medication for this.   Essential hypertension He is not symptomatic. I recommend close follow-up with primary care doctor  for medication adjustment   Orders Placed This Encounter  Procedures  . CT CHEST W CONTRAST    Standing Status:   Future    Standing Expiration Date:   09/13/2017    Scheduling Instructions:     Patient prefer morning    Order Specific Question:   Reason for exam:    Answer:   lymphoma staging    Order Specific Question:   Preferred imaging location?    Answer:   [facility]  . CT ABDOMEN PELVIS W CONTRAST    Standing Status:   Future    Standing Expiration Date:   09/13/2017    Scheduling Instructions:     Patient prefer morning    Order Specific Question:   Reason for exam:    Answer:   staging lymphoma    Order Specific Question:   Preferred imaging location?    Answer:   [facility]  . CBC with Differential/Platelet    Standing  Status:   Future    Standing Expiration Date:   09/13/2017  . Comprehensive metabolic panel    Standing Status:   Future    Standing Expiration Date:   09/13/2017  . Lactate dehydrogenase    Standing Status:   Future    Standing Expiration Date:   09/13/2017   All questions were answered. The patient knows to call the clinic with any problems, questions or concerns. No barriers to learning was detected. I spent 15 minutes counseling the patient face to face. The total time spent in the appointment was 20 minutes and more than 50% was on counseling and review of test results     [facility], [place], MD 08/09/2016 1:51 PM

## 2016-08-09 NOTE — Telephone Encounter (Signed)
Avs report and schedule given to patient, per 08/09/16 los. °

## 2016-08-09 NOTE — Assessment & Plan Note (Signed)
He appears to be functioning well. He will continue his medication for this.

## 2016-08-23 ENCOUNTER — Telehealth: Payer: Self-pay | Admitting: *Deleted

## 2016-08-23 MED ORDER — IBRUTINIB 140 MG PO CAPS: 280.0000 mg | ORAL_CAPSULE | Freq: Every day | ORAL | 6 refills | Status: DC

## 2016-08-23 NOTE — Telephone Encounter (Signed)
Wife left VM pt needs refill on Ibrutinib.  Refill sent to [facility].  Notified wife.

## 2016-09-13 DIAGNOSIS — Z23 Encounter for immunization: Secondary | ICD-10-CM | POA: Diagnosis not present

## 2016-09-25 DIAGNOSIS — L03032 Cellulitis of left toe: Secondary | ICD-10-CM | POA: Diagnosis not present

## 2016-09-25 DIAGNOSIS — L600 Ingrowing nail: Secondary | ICD-10-CM | POA: Diagnosis not present

## 2016-10-01 DIAGNOSIS — I10 Essential (primary) hypertension: Secondary | ICD-10-CM | POA: Diagnosis not present

## 2016-10-01 DIAGNOSIS — Z1389 Encounter for screening for other disorder: Secondary | ICD-10-CM | POA: Diagnosis not present

## 2016-10-01 DIAGNOSIS — Z0000 Encounter for general adult medical examination without abnormal findings: Secondary | ICD-10-CM | POA: Diagnosis not present

## 2016-10-01 DIAGNOSIS — D696 Thrombocytopenia, unspecified: Secondary | ICD-10-CM | POA: Diagnosis not present

## 2016-10-01 DIAGNOSIS — G301 Alzheimer's disease with late onset: Secondary | ICD-10-CM | POA: Diagnosis not present

## 2016-10-02 DIAGNOSIS — I739 Peripheral vascular disease, unspecified: Secondary | ICD-10-CM | POA: Diagnosis not present

## 2016-10-02 DIAGNOSIS — B353 Tinea pedis: Secondary | ICD-10-CM | POA: Diagnosis not present

## 2016-10-02 DIAGNOSIS — L603 Nail dystrophy: Secondary | ICD-10-CM | POA: Diagnosis not present

## 2016-10-03 ENCOUNTER — Telehealth: Payer: Self-pay | Admitting: *Deleted

## 2016-10-03 NOTE — Telephone Encounter (Signed)
Informed wife it is ok w/ Dr. Alvy Bimler for pt to take Lamisal as prescribed by his foot doctor.  She verbalized understanding.

## 2016-10-03 NOTE — Telephone Encounter (Signed)
OK to take Lamisil from my stand point

## 2016-10-03 NOTE — Telephone Encounter (Signed)
Call received @ 100 Pt wife called stating that Husband Jeffrey Simmons had a toe infection and was told to take LAMISIL. & She didn't feel comfortable given him anything by mouth. She was made aware that Dr.Gorsuch was out of the office today however, there was an on call provider. She asked to leave message for Dr.Gorsuch nurse to give her a call back. Call was transferred to vmail/message left.

## 2016-10-09 DIAGNOSIS — M79671 Pain in right foot: Secondary | ICD-10-CM | POA: Diagnosis not present

## 2016-10-09 DIAGNOSIS — M79672 Pain in left foot: Secondary | ICD-10-CM | POA: Diagnosis not present

## 2016-10-09 DIAGNOSIS — M2042 Other hammer toe(s) (acquired), left foot: Secondary | ICD-10-CM | POA: Diagnosis not present

## 2016-10-09 DIAGNOSIS — B351 Tinea unguium: Secondary | ICD-10-CM | POA: Diagnosis not present

## 2016-12-18 DIAGNOSIS — I10 Essential (primary) hypertension: Secondary | ICD-10-CM | POA: Diagnosis not present

## 2016-12-18 DIAGNOSIS — R1310 Dysphagia, unspecified: Secondary | ICD-10-CM | POA: Diagnosis not present

## 2016-12-25 ENCOUNTER — Other Ambulatory Visit: Payer: Self-pay | Admitting: Geriatric Medicine

## 2016-12-25 DIAGNOSIS — R1319 Other dysphagia: Secondary | ICD-10-CM

## 2016-12-25 DIAGNOSIS — R1310 Dysphagia, unspecified: Secondary | ICD-10-CM

## 2016-12-26 ENCOUNTER — Telehealth: Payer: Self-pay

## 2016-12-26 NOTE — Telephone Encounter (Signed)
Wife, [name] called and left message stating that her husband Jeffrey Simmons needs financial assistance.

## 2016-12-26 NOTE — Telephone Encounter (Signed)
[name] states they are out of grant money and they have 5 pills left. And they need financial assistance with medication, Imbruvica. Patient's wife states that she was told to call per Dr. Alvy Bimler if financial assistance needed.

## 2016-12-28 ENCOUNTER — Ambulatory Visit
Admission: RE | Admit: 2016-12-28 | Discharge: 2016-12-28 | Disposition: A | Discharge status: Home / Self Care | Payer: Medicare Other | Source: Ambulatory Visit | Attending: Geriatric Medicine | Admitting: Geriatric Medicine

## 2016-12-28 ENCOUNTER — Other Ambulatory Visit: Payer: Medicare Other

## 2016-12-28 DIAGNOSIS — R1319 Other dysphagia: Secondary | ICD-10-CM

## 2016-12-28 DIAGNOSIS — K224 Dyskinesia of esophagus: Secondary | ICD-10-CM | POA: Diagnosis not present

## 2016-12-28 DIAGNOSIS — R1310 Dysphagia, unspecified: Secondary | ICD-10-CM

## 2016-12-31 ENCOUNTER — Telehealth: Payer: Self-pay

## 2016-12-31 NOTE — Telephone Encounter (Signed)
Wife called back and stated that her husband is out of chemo pills now. Still waiting on financial assistance. Per wife, per [employer] needs formed faxed back from Dr. Alvy Bimler.

## 2017-01-01 NOTE — Telephone Encounter (Signed)
[employer] called with patient concerns about financial assistance and Imbruvica perscription. States that will fax forms for MD to complete.

## 2017-01-02 ENCOUNTER — Telehealth: Payer: Self-pay | Admitting: Pharmacist

## 2017-01-02 NOTE — Telephone Encounter (Signed)
Oral Chemotherapy Pharmacist Encounter  I spoke with patient's wife, [name], and [place] today about status of patient's Imbruvica refill.  Per [name], the grant money awarded by [employer] ([employer]) has been depleted and [employer] needs "something" from our office to fill prescription. [name] also stated that she does not actually know what the copay for the next fill is, and states this information was never given to her by [employer].  I called [employer] at [phone] for clarification. Patient has depleted funds from current [employer], there are currently no other grants available for patient's diagnosis.  Patient's copay is $2406.71, patient's wife told [employer] she was not able to afford this copay when they originally reached out to her ~2 weeks ago [employer] then faxed an application for [employer] ([employer]) to the clinic as foundations are not open Patient is eligible to re-apply for more grant assistance from [employer] on 01/23/17  [employer] will apply to [employer] on patient's behalf on 01/23/17, neither the office or the patient needs to do anything else for this to happen  While I was on the phone with [employer], I had them reach out to [place] to inquire about copay.  I then called [name] back to discuss what I had learned from [employer].  As it turns out, while [name] was on the phone with [employer] she paid the $2400 copay and will receive patient's medication tomorrow (01/03/17). Once patient has been re-enrolled for assistance from [employer], [employer] will help patient gain reimbursement for this month's copay.  No other needs from [facility] identified at this time. We will sign off. [name] knows to call our office with medication acquisition issues. She knows to call Dr. Alvy Bimler for other questions or concerns.  Johny Drilling, PharmD, BCPS, BCOP 01/02/2017  2:24 PM [facility] [phone]

## 2017-01-09 DIAGNOSIS — R3912 Poor urinary stream: Secondary | ICD-10-CM | POA: Diagnosis not present

## 2017-01-09 DIAGNOSIS — N401 Enlarged prostate with lower urinary tract symptoms: Secondary | ICD-10-CM | POA: Diagnosis not present

## 2017-01-09 DIAGNOSIS — R3121 Asymptomatic microscopic hematuria: Secondary | ICD-10-CM | POA: Diagnosis not present

## 2017-02-06 ENCOUNTER — Other Ambulatory Visit (HOSPITAL_BASED_OUTPATIENT_CLINIC_OR_DEPARTMENT_OTHER): Payer: Medicare Other

## 2017-02-06 ENCOUNTER — Telehealth: Payer: Self-pay | Admitting: *Deleted

## 2017-02-06 DIAGNOSIS — C8313 Mantle cell lymphoma, intra-abdominal lymph nodes: Secondary | ICD-10-CM | POA: Diagnosis present

## 2017-02-06 LAB — CBC WITH DIFFERENTIAL/PLATELET
BASO%: 1 % (ref 0.0–2.0)
BASOS ABS: 0.1 10*3/uL (ref 0.0–0.1)
EOS ABS: 0.1 10*3/uL (ref 0.0–0.5)
EOS%: 1.1 % (ref 0.0–7.0)
HEMATOCRIT: 38.5 % (ref 38.4–49.9)
HEMOGLOBIN: 13.1 g/dL (ref 13.0–17.1)
LYMPH%: 22.8 % (ref 14.0–49.0)
MCH: 31.5 pg (ref 27.2–33.4)
MCHC: 33.9 g/dL (ref 32.0–36.0)
MCV: 92.8 fL (ref 79.3–98.0)
MONO#: 0.4 10*3/uL (ref 0.1–0.9)
MONO%: 7.8 % (ref 0.0–14.0)
NEUT#: 3.5 10*3/uL (ref 1.5–6.5)
NEUT%: 67.3 % (ref 39.0–75.0)
Platelets: 98 10*3/uL — ABNORMAL LOW (ref 140–400)
RBC: 4.15 10*6/uL — ABNORMAL LOW (ref 4.20–5.82)
RDW: 14.2 % (ref 11.0–14.6)
WBC: 5.2 10*3/uL (ref 4.0–10.3)
lymph#: 1.2 10*3/uL (ref 0.9–3.3)

## 2017-02-06 LAB — COMPREHENSIVE METABOLIC PANEL
ALBUMIN: 3.3 g/dL — AB (ref 3.5–5.0)
ALK PHOS: 103 U/L (ref 40–150)
ALT: 11 U/L (ref 0–55)
AST: 17 U/L (ref 5–34)
Anion Gap: 6 mEq/L (ref 3–11)
BUN: 22.2 mg/dL (ref 7.0–26.0)
CALCIUM: 8.4 mg/dL (ref 8.4–10.4)
CHLORIDE: 108 meq/L (ref 98–109)
CO2: 27 mEq/L (ref 22–29)
Creatinine: 1.4 mg/dL — ABNORMAL HIGH (ref 0.7–1.3)
EGFR: 44 mL/min/{1.73_m2} — AB (ref >90)
Glucose: 84 mg/dl (ref 70–140)
POTASSIUM: 4.3 meq/L (ref 3.5–5.1)
SODIUM: 142 meq/L (ref 136–145)
Total Bilirubin: 1.06 mg/dL (ref 0.20–1.20)
Total Protein: 5.3 g/dL — ABNORMAL LOW (ref 6.4–8.3)

## 2017-02-06 LAB — LACTATE DEHYDROGENASE: LDH: 203 U/L (ref 125–245)

## 2017-02-06 NOTE — Telephone Encounter (Signed)
Notified Radiology Scheduling that CT has been approved and was expected on 3/14. Requested they call patient today and schedule in first available opening.  Dr Alvy Bimler appt will need to be scheduled once CT is scheduled

## 2017-02-07 ENCOUNTER — Ambulatory Visit: Payer: Medicare Other | Admitting: Hematology and Oncology

## 2017-02-11 ENCOUNTER — Telehealth: Payer: Self-pay | Admitting: *Deleted

## 2017-02-11 NOTE — Telephone Encounter (Signed)
Notified of appt with Dr Alvy Bimler on 3/27 @ 1115

## 2017-02-12 DIAGNOSIS — I700 Atherosclerosis of aorta: Secondary | ICD-10-CM | POA: Diagnosis not present

## 2017-02-12 DIAGNOSIS — B351 Tinea unguium: Secondary | ICD-10-CM | POA: Diagnosis not present

## 2017-02-12 DIAGNOSIS — M79672 Pain in left foot: Secondary | ICD-10-CM | POA: Diagnosis not present

## 2017-02-12 DIAGNOSIS — C8590 Non-Hodgkin lymphoma, unspecified, unspecified site: Secondary | ICD-10-CM | POA: Diagnosis not present

## 2017-02-12 DIAGNOSIS — D696 Thrombocytopenia, unspecified: Secondary | ICD-10-CM | POA: Diagnosis not present

## 2017-02-12 DIAGNOSIS — I10 Essential (primary) hypertension: Secondary | ICD-10-CM | POA: Diagnosis not present

## 2017-02-12 DIAGNOSIS — G301 Alzheimer's disease with late onset: Secondary | ICD-10-CM | POA: Diagnosis not present

## 2017-02-12 DIAGNOSIS — M79671 Pain in right foot: Secondary | ICD-10-CM | POA: Diagnosis not present

## 2017-02-13 ENCOUNTER — Ambulatory Visit (HOSPITAL_COMMUNITY)
Admission: RE | Admit: 2017-02-13 | Discharge: 2017-02-13 | Disposition: A | Discharge status: Home / Self Care | Payer: Medicare Other | Source: Ambulatory Visit | Attending: Hematology and Oncology | Admitting: Hematology and Oncology

## 2017-02-13 ENCOUNTER — Encounter (HOSPITAL_COMMUNITY): Payer: Self-pay

## 2017-02-13 DIAGNOSIS — N281 Cyst of kidney, acquired: Secondary | ICD-10-CM | POA: Insufficient documentation

## 2017-02-13 DIAGNOSIS — C8313 Mantle cell lymphoma, intra-abdominal lymph nodes: Secondary | ICD-10-CM | POA: Insufficient documentation

## 2017-02-13 DIAGNOSIS — K5730 Diverticulosis of large intestine without perforation or abscess without bleeding: Secondary | ICD-10-CM | POA: Insufficient documentation

## 2017-02-13 DIAGNOSIS — I700 Atherosclerosis of aorta: Secondary | ICD-10-CM | POA: Insufficient documentation

## 2017-02-13 DIAGNOSIS — Z8572 Personal history of non-Hodgkin lymphomas: Secondary | ICD-10-CM | POA: Diagnosis not present

## 2017-02-13 DIAGNOSIS — K7689 Other specified diseases of liver: Secondary | ICD-10-CM | POA: Diagnosis not present

## 2017-02-13 MED ORDER — IOPAMIDOL (ISOVUE-300) INJECTION 61%
100.0000 mL | Freq: Once | INTRAVENOUS | Status: AC | PRN
  Administered 2017-02-13: 80 mL via INTRAVENOUS

## 2017-02-13 MED ORDER — IOPAMIDOL (ISOVUE-300) INJECTION 61%
INTRAVENOUS | Status: DC
Start: 2017-02-13 — End: 2017-02-14
  Filled 2017-02-13: qty 100

## 2017-02-19 ENCOUNTER — Encounter: Payer: Self-pay | Admitting: Hematology and Oncology

## 2017-02-19 ENCOUNTER — Telehealth: Payer: Self-pay | Admitting: Hematology and Oncology

## 2017-02-19 ENCOUNTER — Ambulatory Visit (HOSPITAL_BASED_OUTPATIENT_CLINIC_OR_DEPARTMENT_OTHER): Payer: Medicare Other | Admitting: Hematology and Oncology

## 2017-02-19 DIAGNOSIS — C8313 Mantle cell lymphoma, intra-abdominal lymph nodes: Secondary | ICD-10-CM | POA: Diagnosis not present

## 2017-02-19 DIAGNOSIS — N183 Chronic kidney disease, stage 3 unspecified: Secondary | ICD-10-CM | POA: Insufficient documentation

## 2017-02-19 DIAGNOSIS — F0390 Unspecified dementia without behavioral disturbance: Secondary | ICD-10-CM | POA: Diagnosis not present

## 2017-02-19 DIAGNOSIS — I10 Essential (primary) hypertension: Secondary | ICD-10-CM

## 2017-02-19 DIAGNOSIS — D696 Thrombocytopenia, unspecified: Secondary | ICD-10-CM

## 2017-02-19 DIAGNOSIS — F03B0 Unspecified dementia, moderate, without behavioral disturbance, psychotic disturbance, mood disturbance, and anxiety: Secondary | ICD-10-CM

## 2017-02-19 NOTE — Assessment & Plan Note (Signed)
He appears to be functioning well. He will continue his medication for this.

## 2017-02-19 NOTE — Assessment & Plan Note (Signed)
His recent imaging study showed no evidence of disease. He is tolerating reduced dose Ibrutinib well. Continue at 2 tablets daily. We see him back in 6 months for repeat history, physical examination and blood work. I will order annual CT imaging to exclude disease progression, next due March 2019

## 2017-02-19 NOTE — Telephone Encounter (Signed)
Gave patient avs report and appointments for September.  °

## 2017-02-19 NOTE — Assessment & Plan Note (Signed)
He is not symptomatic. I recommend close follow-up with primary care doctor for medication adjustment

## 2017-02-19 NOTE — Assessment & Plan Note (Signed)
This is likely related to dehydration Continue aggressive medical management and I encouraged his wife to make sure the patient drinks adequate amount of fluid intake

## 2017-02-19 NOTE — Assessment & Plan Note (Signed)
This is likely due to recent treatment. The patient denies recent history of bleeding such as epistaxis, hematuria or hematochezia. He is asymptomatic from the low platelet count. I will observe for now.  he does not require transfusion now. I will continue the chemotherapy at current dose without dosage adjustment.  If the thrombocytopenia gets progressive worse in the future, I might have to delay his treatment or adjust the chemotherapy dose.   

## 2017-02-19 NOTE — Progress Notes (Signed)
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 Chemotherapy was put on hold.      06/08/2015 Imaging    Repeat CT scan showed complete remission      02/08/2016 Imaging    CT imaging showed complete response/remission      02/13/2017 Imaging    Stable CT appearance of the chest, abdomen and pelvis. No findings for recurrent lymphoma. Stable atherosclerotic calcifications involving the thoracic and abdominal aorta and branch vessels but no aneurysm or dissection. Advanced sigmoid diverticulosis but no findings for diverticulitis. Stable hepatic and left renal cysts       INTERVAL HISTORY: Please see below for problem oriented charting. He is here accompanied by his wife There were no reported recent falls No recent infection The patient denies any recent signs or symptoms of bleeding such as spontaneous epistaxis, hematuria or hematochezia. The patient feels well.  He denies pain. There were no reported lymphadenopathy, anorexia or abnormal weight loss  REVIEW OF SYSTEMS:   Constitutional: Denies fevers, chills or abnormal weight loss Eyes: Denies blurriness of vision Ears, nose, mouth, throat, and face: Denies mucositis or sore throat Respiratory: Denies cough, dyspnea or wheezes Cardiovascular: Denies palpitation, chest discomfort or lower extremity swelling Gastrointestinal:  Denies nausea, heartburn or change in bowel habits Skin: Denies abnormal skin rashes Lymphatics: Denies new lymphadenopathy or easy bruising Neurological:Denies numbness, tingling or new  weaknesses Behavioral/Psych: Mood is stable, no new changes  All other systems were reviewed with the patient and are negative.  I have reviewed the past medical history, past surgical history, social history and family history with the patient and they are unchanged from previous note.  ALLERGIES:  is allergic to aricept [donepezil hcl] and zosyn [piperacillin sod-tazobactam so].  MEDICATIONS:  Current Outpatient Prescriptions  Medication Sig Dispense Refill  . amLODipine (NORVASC) 2.5 MG tablet Take 2.5 mg by mouth daily.    Marland Kitchen aspirin 81 MG tablet Take 81 mg by mouth daily.    Marland Kitchen ibrutinib (IMBRUVICA) 140 MG capsul Take 2 capsules (280 mg total) by mouth daily. 60 capsule 6  . Multiple Vitamins-Minerals (EYE-VITE PLUS LUTEIN) CAPS Take 1 capsule by mouth 2 (two) times daily.    Marland Kitchen NAMENDA XR 28 MG CP24 24 hr capsule Take 28 mg by mouth daily.     Marland Kitchen omeprazole (PRILOSEC) 20 MG capsule Take 20 mg by mouth daily.     No current facility-administered medications for this visit.     PHYSICAL EXAMINATION: ECOG PERFORMANCE STATUS: 1 - Symptomatic but completely ambulatory  Vitals:   02/19/17 1055  BP: (!) 180/69  Pulse: (!) 58  Resp: 17  Temp: 97.6 F (36.4 C)   Filed Weights   02/19/17 1055  Weight: 170 lb 6.4 oz (77.3 kg)    GENERAL:alert, no distress and comfortable SKIN: skin color, texture, turgor are normal, no rashes or significant lesions EYES: normal, Conjunctiva are pink and non-injected, sclera clear OROPHARYNX:no exudate, no erythema and lips, buccal mucosa, and tongue normal  NECK: supple, thyroid normal size, non-tender, without nodularity LYMPH:  no palpable lymphadenopathy in the cervical, axillary or inguinal LUNGS: clear to auscultation and percussion with normal breathing effort HEART: regular rate & rhythm and no murmurs and no lower extremity edema ABDOMEN:abdomen soft, non-tender and normal bowel sounds Musculoskeletal:no cyanosis of digits and no clubbing   NEURO: alert & oriented x 3 with fluent speech, no focal motor/sensory deficits  LABORATORY DATA:  I have reviewed the data as listed    Component Value Date/Time   NA 142 02/06/2017 1017   K 4.3 02/06/2017 1017   CL 111 03/21/2015 0445   CL 103 02/24/2013 0923   CO2 27 02/06/2017 1017   GLUCOSE 84 02/06/2017 1017   GLUCOSE 98 02/24/2013 0923   BUN 22.2 02/06/2017 1017   CREATININE 1.4 (H) 02/06/2017 1017   CALCIUM 8.4 02/06/2017 1017   PROT 5.3 (L) 02/06/2017 1017   ALBUMIN 3.3 (L) 02/06/2017 1017   AST 17 02/06/2017 1017   ALT 11 02/06/2017 1017   ALKPHOS 103 02/06/2017 1017   BILITOT 1.06 02/06/2017 1017   GFRNONAA 47 (L) 03/21/2015 0445   GFRAA 55 (L) 03/21/2015 0445    No results found for: SPEP, UPEP  Lab Results  Component Value Date   WBC 5.2 02/06/2017   NEUTROABS 3.5 02/06/2017   HGB 13.1 02/06/2017   HCT 38.5 02/06/2017   MCV 92.8 02/06/2017   PLT 98 (L) 02/06/2017      Chemistry      Component Value Date/Time   NA 142 02/06/2017 1017   K 4.3 02/06/2017 1017   CL 111 03/21/2015 0445   CL 103 02/24/2013 0923   CO2 27 02/06/2017 1017   BUN 22.2 02/06/2017 1017   CREATININE 1.4 (H) 02/06/2017 1017      Component Value Date/Time   CALCIUM 8.4 02/06/2017 1017   ALKPHOS 103 02/06/2017 1017   AST 17 02/06/2017 1017   ALT 11 02/06/2017 1017   BILITOT 1.06 02/06/2017 1017       RADIOGRAPHIC STUDIES: I have personally reviewed the radiological images as listed and agreed with the findings in the report. Ct Chest W Contrast  Result Date: 02/13/2017 CLINICAL DATA:  History of mantle cell lymphoma. EXAM: CT CHEST, ABDOMEN, AND PELVIS WITH CONTRAST TECHNIQUE: Multidetector CT imaging of the chest, abdomen and pelvis was performed following the standard protocol during bolus administration of intravenous contrast. CONTRAST:  55mL ISOVUE-300 IOPAMIDOL (ISOVUE-300) INJECTION 61% COMPARISON:  06/08/2015 and 02/07/2016 FINDINGS: CT CHEST FINDINGS Chest wall:  No chest wall mass, supraclavicular or axillary lymphadenopathy. Tiny scattered lymph nodes are stable. Status post right thyroid lobe resection. Small nodules noted in the left thyroid lobe. Cardiovascular: The heart is normal in size. No pericardial effusion. Stable mild tortuosity and atherosclerotic calcifications involving the thoracic aorta. The branch vessels are patent. Stable scattered coronary artery calcifications. Mediastinum/Nodes: No mediastinal or hilar mass or adenopathy. The esophagus is grossly normal except for a stable moderate-sized hiatal hernia. Lungs/Pleura: No acute pulmonary findings. No worrisome pulmonary lesions. There are few scattered tiny subpleural pulmonary nodules which are likely benign lymph nodes. No pleural effusion. Musculoskeletal: No significant bony findings. CT ABDOMEN PELVIS FINDINGS Hepatobiliary: Stable low-attenuation liver lesions consistent with benign cysts. A few small vascular shunts are noted. No worrisome hepatic lesions or intrahepatic biliary dilatation. Small gallstone noted in the gallbladder. Pancreas: No mass, inflammation or ductal dilatation. Spleen: Normal size.  No focal lesions. Adrenals/Urinary Tract: The adrenal glands are unremarkable and stable. No renal lesions. No hydronephrosis, renal, ureteral or bladder calculi.  Stomach/Bowel: The stomach, duodenum, small bowel and colon are grossly normal. No inflammatory changes, mass lesions or obstructive findings. Advanced descending and sigmoid diverticulosis without findings for acute diverticulitis. The terminal ileum is normal. The appendix is normal. Vascular/Lymphatic: Stable aortic and branch vessel calcifications but no aneurysm or dissection. The major venous structures are patent. Stable small scattered mesenteric and retroperitoneal lymph nodes but no mass or overt adenopathy. Reproductive: The prostate gland and seminal vesicles are unremarkable. Other: There is a small right inguinal hernia  containing small bowel loops but no obstruction or incarceration. No inguinal mass or adenopathy. No pelvic adenopathy. Musculoskeletal: No significant bony findings. IMPRESSION: Stable CT appearance of the chest, abdomen and pelvis. No findings for recurrent lymphoma. Stable atherosclerotic calcifications involving the thoracic and abdominal aorta and branch vessels but no aneurysm or dissection. Advanced sigmoid diverticulosis but no findings for diverticulitis. Stable hepatic and left renal cysts.  The Electronically Signed   By: Marijo Sanes M.D.   On: 02/13/2017 14:30   Ct Abdomen Pelvis W Contrast  Result Date: 02/13/2017 CLINICAL DATA:  History of mantle cell lymphoma. EXAM: CT CHEST, ABDOMEN, AND PELVIS WITH CONTRAST TECHNIQUE: Multidetector CT imaging of the chest, abdomen and pelvis was performed following the standard protocol during bolus administration of intravenous contrast. CONTRAST:  42mL ISOVUE-300 IOPAMIDOL (ISOVUE-300) INJECTION 61% COMPARISON:  06/08/2015 and 02/07/2016 FINDINGS: CT CHEST FINDINGS Chest wall: No chest wall mass, supraclavicular or axillary lymphadenopathy. Tiny scattered lymph nodes are stable. Status post right thyroid lobe resection. Small nodules noted in the left thyroid lobe. Cardiovascular: The heart is normal in size. No pericardial effusion. Stable mild tortuosity and atherosclerotic calcifications involving the thoracic aorta. The branch vessels are patent. Stable scattered coronary artery calcifications. Mediastinum/Nodes: No mediastinal or hilar mass or adenopathy. The esophagus is grossly normal except for a stable moderate-sized hiatal hernia. Lungs/Pleura: No acute pulmonary findings. No worrisome pulmonary lesions. There are few scattered tiny subpleural pulmonary nodules which are likely benign lymph nodes. No pleural effusion. Musculoskeletal: No significant bony findings. CT ABDOMEN PELVIS FINDINGS Hepatobiliary: Stable low-attenuation liver lesions  consistent with benign cysts. A few small vascular shunts are noted. No worrisome hepatic lesions or intrahepatic biliary dilatation. Small gallstone noted in the gallbladder. Pancreas: No mass, inflammation or ductal dilatation. Spleen: Normal size.  No focal lesions. Adrenals/Urinary Tract: The adrenal glands are unremarkable and stable. No renal lesions. No hydronephrosis, renal, ureteral or bladder calculi. Stomach/Bowel: The stomach, duodenum, small bowel and colon are grossly normal. No inflammatory changes, mass lesions or obstructive findings. Advanced descending and sigmoid diverticulosis without findings for acute diverticulitis. The terminal ileum is normal. The appendix is normal. Vascular/Lymphatic: Stable aortic and branch vessel calcifications but no aneurysm or dissection. The major venous structures are patent. Stable small scattered mesenteric and retroperitoneal lymph nodes but no mass or overt adenopathy. Reproductive: The prostate gland and seminal vesicles are unremarkable. Other: There is a small right inguinal hernia containing small bowel loops but no obstruction or incarceration. No inguinal mass or adenopathy. No pelvic adenopathy. Musculoskeletal: No significant bony findings. IMPRESSION: Stable CT appearance of the chest, abdomen and pelvis. No findings for recurrent lymphoma. Stable atherosclerotic calcifications involving the thoracic and abdominal aorta and branch vessels but no aneurysm or dissection. Advanced sigmoid diverticulosis but no findings for diverticulitis. Stable hepatic and left renal cysts.  The Electronically Signed   By: Marijo Sanes M.D.   On: 02/13/2017 14:30    ASSESSMENT & PLAN:  Mantle cell lymphoma of intra-abdominal  lymph nodes His recent imaging study showed no evidence of disease. He is tolerating reduced dose Ibrutinib well. Continue at 2 tablets daily. We see him back in 6 months for repeat history, physical examination and blood work. I will  order annual CT imaging to exclude disease progression, next due March 2019  Thrombocytopenia [facility]) This is likely due to recent treatment. The patient denies recent history of bleeding such as epistaxis, hematuria or hematochezia. He is asymptomatic from the low platelet count. I will observe for now.  he does not require transfusion now. I will continue the chemotherapy at current dose without dosage adjustment.  If the thrombocytopenia gets progressive worse in the future, I might have to delay his treatment or adjust the chemotherapy dose.  Chronic kidney disease, stage III (moderate) This is likely related to dehydration Continue aggressive medical management and I encouraged his wife to make sure the patient drinks adequate amount of fluid intake  Moderate dementia without behavioral disturbance He appears to be functioning well. He will continue his medication for this.   Essential hypertension He is not symptomatic. I recommend close follow-up with primary care doctor for medication adjustment   No orders of the defined types were placed in this encounter.  All questions were answered. The patient knows to call the clinic with any problems, questions or concerns. No barriers to learning was detected. I spent 20 minutes counseling the patient face to face. The total time spent in the appointment was 25 minutes and more than 50% was on counseling and review of test results     Heath Lark, MD 02/19/2017 12:53 PM

## 2017-02-20 DIAGNOSIS — H353211 Exudative age-related macular degeneration, right eye, with active choroidal neovascularization: Secondary | ICD-10-CM | POA: Diagnosis not present

## 2017-02-22 ENCOUNTER — Other Ambulatory Visit: Payer: Self-pay | Admitting: *Deleted

## 2017-02-22 MED ORDER — IBRUTINIB 140 MG PO CAPS: 280.0000 mg | ORAL_CAPSULE | Freq: Every day | ORAL | 6 refills | Status: DC

## 2017-03-05 DIAGNOSIS — H35373 Puckering of macula, bilateral: Secondary | ICD-10-CM | POA: Diagnosis not present

## 2017-03-05 DIAGNOSIS — H3561 Retinal hemorrhage, right eye: Secondary | ICD-10-CM | POA: Diagnosis not present

## 2017-03-05 DIAGNOSIS — Z961 Presence of intraocular lens: Secondary | ICD-10-CM | POA: Diagnosis not present

## 2017-03-05 DIAGNOSIS — H353122 Nonexudative age-related macular degeneration, left eye, intermediate dry stage: Secondary | ICD-10-CM | POA: Diagnosis not present

## 2017-03-05 DIAGNOSIS — H35452 Secondary pigmentary degeneration, left eye: Secondary | ICD-10-CM | POA: Diagnosis not present

## 2017-03-05 DIAGNOSIS — H353211 Exudative age-related macular degeneration, right eye, with active choroidal neovascularization: Secondary | ICD-10-CM | POA: Diagnosis not present

## 2017-03-29 ENCOUNTER — Other Ambulatory Visit: Payer: Self-pay | Admitting: *Deleted

## 2017-03-29 MED ORDER — IBRUTINIB 140 MG PO CAPS: 280.0000 mg | ORAL_CAPSULE | Freq: Every day | ORAL | 11 refills | Status: DC

## 2017-04-09 DIAGNOSIS — H353211 Exudative age-related macular degeneration, right eye, with active choroidal neovascularization: Secondary | ICD-10-CM | POA: Diagnosis not present

## 2017-04-22 DIAGNOSIS — Z515 Encounter for palliative care: Secondary | ICD-10-CM | POA: Diagnosis not present

## 2017-04-22 DIAGNOSIS — G301 Alzheimer's disease with late onset: Secondary | ICD-10-CM | POA: Diagnosis not present

## 2017-04-22 DIAGNOSIS — F0280 Dementia in other diseases classified elsewhere without behavioral disturbance: Secondary | ICD-10-CM | POA: Diagnosis not present

## 2017-04-25 DIAGNOSIS — G301 Alzheimer's disease with late onset: Secondary | ICD-10-CM | POA: Diagnosis not present

## 2017-04-25 DIAGNOSIS — I10 Essential (primary) hypertension: Secondary | ICD-10-CM | POA: Diagnosis not present

## 2017-05-07 DIAGNOSIS — I10 Essential (primary) hypertension: Secondary | ICD-10-CM | POA: Diagnosis not present

## 2017-05-07 DIAGNOSIS — G301 Alzheimer's disease with late onset: Secondary | ICD-10-CM | POA: Diagnosis not present

## 2017-05-22 DIAGNOSIS — M79671 Pain in right foot: Secondary | ICD-10-CM | POA: Diagnosis not present

## 2017-05-22 DIAGNOSIS — B351 Tinea unguium: Secondary | ICD-10-CM | POA: Diagnosis not present

## 2017-05-22 DIAGNOSIS — G5761 Lesion of plantar nerve, right lower limb: Secondary | ICD-10-CM | POA: Diagnosis not present

## 2017-05-22 DIAGNOSIS — M79672 Pain in left foot: Secondary | ICD-10-CM | POA: Diagnosis not present

## 2017-05-30 DIAGNOSIS — H353211 Exudative age-related macular degeneration, right eye, with active choroidal neovascularization: Secondary | ICD-10-CM | POA: Diagnosis not present

## 2017-06-17 DIAGNOSIS — I10 Essential (primary) hypertension: Secondary | ICD-10-CM | POA: Diagnosis not present

## 2017-06-17 DIAGNOSIS — G301 Alzheimer's disease with late onset: Secondary | ICD-10-CM | POA: Diagnosis not present

## 2017-07-08 DIAGNOSIS — H353211 Exudative age-related macular degeneration, right eye, with active choroidal neovascularization: Secondary | ICD-10-CM | POA: Diagnosis not present

## 2017-07-11 DIAGNOSIS — R05 Cough: Secondary | ICD-10-CM | POA: Diagnosis not present

## 2017-07-11 DIAGNOSIS — G301 Alzheimer's disease with late onset: Secondary | ICD-10-CM | POA: Diagnosis not present

## 2017-07-11 DIAGNOSIS — I10 Essential (primary) hypertension: Secondary | ICD-10-CM | POA: Diagnosis not present

## 2017-07-31 DIAGNOSIS — N183 Chronic kidney disease, stage 3 (moderate): Secondary | ICD-10-CM | POA: Diagnosis not present

## 2017-07-31 DIAGNOSIS — I129 Hypertensive chronic kidney disease with stage 1 through stage 4 chronic kidney disease, or unspecified chronic kidney disease: Secondary | ICD-10-CM | POA: Diagnosis not present

## 2017-07-31 DIAGNOSIS — G301 Alzheimer's disease with late onset: Secondary | ICD-10-CM | POA: Diagnosis not present

## 2017-08-15 ENCOUNTER — Other Ambulatory Visit: Payer: Self-pay | Admitting: Geriatric Medicine

## 2017-08-15 DIAGNOSIS — G453 Amaurosis fugax: Secondary | ICD-10-CM | POA: Diagnosis not present

## 2017-08-15 DIAGNOSIS — I10 Essential (primary) hypertension: Secondary | ICD-10-CM | POA: Diagnosis not present

## 2017-08-19 ENCOUNTER — Other Ambulatory Visit: Payer: Self-pay | Admitting: Hematology and Oncology

## 2017-08-19 ENCOUNTER — Ambulatory Visit (HOSPITAL_BASED_OUTPATIENT_CLINIC_OR_DEPARTMENT_OTHER): Payer: Medicare Other | Admitting: Hematology and Oncology

## 2017-08-19 ENCOUNTER — Telehealth: Payer: Self-pay | Admitting: Hematology and Oncology

## 2017-08-19 ENCOUNTER — Other Ambulatory Visit (HOSPITAL_BASED_OUTPATIENT_CLINIC_OR_DEPARTMENT_OTHER): Payer: Medicare Other

## 2017-08-19 VITALS — BP 206/54 | HR 53 | Temp 97.5°F | Resp 17 | Ht 74.0 in | Wt 160.8 lb

## 2017-08-19 DIAGNOSIS — Y92009 Unspecified place in unspecified non-institutional (private) residence as the place of occurrence of the external cause: Principal | ICD-10-CM

## 2017-08-19 DIAGNOSIS — N183 Chronic kidney disease, stage 3 unspecified: Secondary | ICD-10-CM

## 2017-08-19 DIAGNOSIS — C8313 Mantle cell lymphoma, intra-abdominal lymph nodes: Secondary | ICD-10-CM | POA: Diagnosis not present

## 2017-08-19 DIAGNOSIS — R634 Abnormal weight loss: Secondary | ICD-10-CM | POA: Diagnosis not present

## 2017-08-19 DIAGNOSIS — W19XXXD Unspecified fall, subsequent encounter: Secondary | ICD-10-CM

## 2017-08-19 DIAGNOSIS — I10 Essential (primary) hypertension: Secondary | ICD-10-CM | POA: Diagnosis not present

## 2017-08-19 DIAGNOSIS — D696 Thrombocytopenia, unspecified: Secondary | ICD-10-CM | POA: Diagnosis not present

## 2017-08-19 LAB — CBC WITH DIFFERENTIAL/PLATELET
BASO%: 0.3 % (ref 0.0–2.0)
Basophils Absolute: 0 10*3/uL (ref 0.0–0.1)
EOS ABS: 0.1 10*3/uL (ref 0.0–0.5)
EOS%: 1.2 % (ref 0.0–7.0)
HCT: 42.6 % (ref 38.4–49.9)
HGB: 14.5 g/dL (ref 13.0–17.1)
LYMPH#: 1.5 10*3/uL (ref 0.9–3.3)
LYMPH%: 25 % (ref 14.0–49.0)
MCH: 32.1 pg (ref 27.2–33.4)
MCHC: 34 g/dL (ref 32.0–36.0)
MCV: 94.2 fL (ref 79.3–98.0)
MONO#: 0.5 10*3/uL (ref 0.1–0.9)
MONO%: 7.7 % (ref 0.0–14.0)
NEUT%: 65.8 % (ref 39.0–75.0)
NEUTROS ABS: 3.9 10*3/uL (ref 1.5–6.5)
Platelets: 96 10*3/uL — ABNORMAL LOW (ref 140–400)
RBC: 4.52 10*6/uL (ref 4.20–5.82)
RDW: 14.2 % (ref 11.0–14.6)
WBC: 5.9 10*3/uL (ref 4.0–10.3)

## 2017-08-19 LAB — COMPREHENSIVE METABOLIC PANEL
ALT: 10 U/L (ref 0–55)
ANION GAP: 6 meq/L (ref 3–11)
AST: 16 U/L (ref 5–34)
Albumin: 3.6 g/dL (ref 3.5–5.0)
Alkaline Phosphatase: 89 U/L (ref 40–150)
BUN: 20.9 mg/dL (ref 7.0–26.0)
CHLORIDE: 107 meq/L (ref 98–109)
CO2: 27 meq/L (ref 22–29)
Calcium: 9.1 mg/dL (ref 8.4–10.4)
Creatinine: 1.4 mg/dL — ABNORMAL HIGH (ref 0.7–1.3)
EGFR: 45 mL/min/{1.73_m2} — AB (ref >90)
Glucose: 78 mg/dl (ref 70–140)
POTASSIUM: 4.6 meq/L (ref 3.5–5.1)
Sodium: 140 mEq/L (ref 136–145)
Total Bilirubin: 1.19 mg/dL (ref 0.20–1.20)
Total Protein: 5.9 g/dL — ABNORMAL LOW (ref 6.4–8.3)

## 2017-08-19 NOTE — Telephone Encounter (Signed)
Scheduled appt per 9/24 los - Gave patient AVS and calender per los.  

## 2017-08-20 ENCOUNTER — Ambulatory Visit
Admission: RE | Admit: 2017-08-20 | Discharge: 2017-08-20 | Disposition: A | Discharge status: Home / Self Care | Payer: Medicare Other | Source: Ambulatory Visit | Attending: Geriatric Medicine | Admitting: Geriatric Medicine

## 2017-08-20 ENCOUNTER — Other Ambulatory Visit: Payer: Self-pay | Admitting: Hematology and Oncology

## 2017-08-20 ENCOUNTER — Encounter: Payer: Self-pay | Admitting: Hematology and Oncology

## 2017-08-20 ENCOUNTER — Telehealth: Payer: Self-pay

## 2017-08-20 DIAGNOSIS — I6523 Occlusion and stenosis of bilateral carotid arteries: Secondary | ICD-10-CM | POA: Diagnosis not present

## 2017-08-20 DIAGNOSIS — G453 Amaurosis fugax: Secondary | ICD-10-CM

## 2017-08-20 DIAGNOSIS — R634 Abnormal weight loss: Secondary | ICD-10-CM | POA: Insufficient documentation

## 2017-08-20 NOTE — Assessment & Plan Note (Signed)
The patient has significant gait imbalance He had recent fall I recommend physical therapy and Occupational Therapy consult

## 2017-08-20 NOTE — Assessment & Plan Note (Signed)
This is likely related to aging Recommend risk factor modification

## 2017-08-20 NOTE — Assessment & Plan Note (Signed)
The patient has significant hypertension Due to recent fall and hypotension, his blood pressure medication was discontinued recently I recommend close blood pressure monitoring and if his blood pressure remain high, he may need to be restarted back on treatment

## 2017-08-20 NOTE — Telephone Encounter (Signed)
I placed orders for PT & OT You might have to fax the order as I cannot find the specific ones his wife prefers

## 2017-08-20 NOTE — Progress Notes (Signed)
[facility] OFFICE PROGRESS NOTE  Patient Care Team: Lajean Manes, MD as PCP - General (Internal Medicine) Irine Seal, MD as Consulting Physician (Urology)  SUMMARY OF ONCOLOGIC HISTORY:   Mantle cell lymphoma of intra-abdominal lymph nodes ([place])   03/04/2012 Initial Diagnosis    Mantle cell lymphoma of intra-abdominal lymph nodes      03/18/2015 - 03/21/2015 Hospital Admission    He was admitted to the hospital after he was found to have a fall. Chemotherapy was put on hold.      06/08/2015 Imaging    Repeat CT scan showed complete remission      02/08/2016 Imaging    CT imaging showed complete response/remission      02/13/2017 Imaging    Stable CT appearance of the chest, abdomen and pelvis. No findings for recurrent lymphoma. Stable atherosclerotic calcifications involving the thoracic and abdominal aorta and branch vessels but no aneurysm or dissection. Advanced sigmoid diverticulosis but no findings for diverticulitis. Stable hepatic and left renal cysts       INTERVAL HISTORY: Please see below for problem oriented charting. He is seen as part of his follow-up His wife is very concerned about his well-being The patient has not move much He had recent fall and had hip pain However, he was able to get up out of the chair and walk my office without significant complaints of pain His wife is getting burned out taking care of him She denies recent behavioral disturbance from him from dementia There were no reported recent infection He has lost significant amount of weight since the last time I saw him His wife stated that the patient eats very little  REVIEW OF SYSTEMS:   Constitutional: Denies fevers, chills Eyes: Denies blurriness of vision Ears, nose, mouth, throat, and face: Denies mucositis or sore throat Respiratory: Denies cough, dyspnea or wheezes Cardiovascular: Denies palpitation, chest discomfort or lower extremity swelling Gastrointestinal:   Denies nausea, heartburn or change in bowel habits Skin: Denies abnormal skin rashes Lymphatics: Denies new lymphadenopathy or easy bruising Neurological:Denies numbness, tingling or new weaknesses Behavioral/Psych: Mood is stable, no new changes  All other systems were reviewed with the patient and are negative.  I have reviewed the past medical history, past surgical history, social history and family history with the patient and they are unchanged from previous note.  ALLERGIES:  is allergic to aricept [donepezil hcl] and zosyn [piperacillin sod-tazobactam so].  MEDICATIONS:  Current Outpatient Prescriptions  Medication Sig Dispense Refill  . aspirin 81 MG tablet Take 81 mg by mouth daily.    Marland Kitchen ibrutinib (IMBRUVICA) 140 MG capsul Take 2 capsules (280 mg total) by mouth daily. 60 capsule 11  . Multiple Vitamins-Minerals (EYE-VITE PLUS LUTEIN) CAPS Take 1 capsule by mouth 2 (two) times daily.    Marland Kitchen NAMENDA XR 28 MG CP24 24 hr capsule Take 28 mg by mouth daily.     Marland Kitchen omeprazole (PRILOSEC) 20 MG capsule Take 20 mg by mouth daily.    . ranitidine (ZANTAC) 75 MG tablet Take 75 mg by mouth 2 (two) times daily. 2 tablets before am and evening meds     No current facility-administered medications for this visit.     PHYSICAL EXAMINATION: ECOG PERFORMANCE STATUS: 2 - Symptomatic, <50% confined to bed  Vitals:   08/19/17 1052  BP: (!) 206/54  Pulse: (!) 53  Resp: 17  Temp: (!) 97.5 F (36.4 C)  SpO2: 100%   Filed Weights   08/19/17 1052  Weight: 160 lb 12.8 oz (72.9 kg)    GENERAL:alert, no distress and comfortable SKIN: skin color, texture, turgor are normal, no rashes or significant lesions EYES: normal, Conjunctiva are pink and non-injected, sclera clear OROPHARYNX:no exudate, no erythema and lips, buccal mucosa, and tongue normal  NECK: supple, thyroid normal size, non-tender, without nodularity LYMPH:  no palpable lymphadenopathy in the cervical, axillary or inguinal LUNGS:  clear to auscultation and percussion with normal breathing effort HEART: regular rate & rhythm and no murmurs and no lower extremity edema ABDOMEN:abdomen soft, non-tender and normal bowel sounds Musculoskeletal:no cyanosis of digits and no clubbing  NEURO: alert & oriented x 3 with fluent speech, no focal motor/sensory deficits  LABORATORY DATA:  I have reviewed the data as listed    Component Value Date/Time   NA 140 08/19/2017 1030   K 4.6 08/19/2017 1030   CL 111 03/21/2015 0445   CL 103 02/24/2013 0923   CO2 27 08/19/2017 1030   GLUCOSE 78 08/19/2017 1030   GLUCOSE 98 02/24/2013 0923   BUN 20.9 08/19/2017 1030   CREATININE 1.4 (H) 08/19/2017 1030   CALCIUM 9.1 08/19/2017 1030   PROT 5.9 (L) 08/19/2017 1030   ALBUMIN 3.6 08/19/2017 1030   AST 16 08/19/2017 1030   ALT 10 08/19/2017 1030   ALKPHOS 89 08/19/2017 1030   BILITOT 1.19 08/19/2017 1030   GFRNONAA 47 (L) 03/21/2015 0445   GFRAA 55 (L) 03/21/2015 0445    No results found for: SPEP, UPEP  Lab Results  Component Value Date   WBC 5.9 08/19/2017   NEUTROABS 3.9 08/19/2017   HGB 14.5 08/19/2017   HCT 42.6 08/19/2017   MCV 94.2 08/19/2017   PLT 96 (L) 08/19/2017      Chemistry      Component Value Date/Time   NA 140 08/19/2017 1030   K 4.6 08/19/2017 1030   CL 111 03/21/2015 0445   CL 103 02/24/2013 0923   CO2 27 08/19/2017 1030   BUN 20.9 08/19/2017 1030   CREATININE 1.4 (H) 08/19/2017 1030      Component Value Date/Time   CALCIUM 9.1 08/19/2017 1030   ALKPHOS 89 08/19/2017 1030   AST 16 08/19/2017 1030   ALT 10 08/19/2017 1030   BILITOT 1.19 08/19/2017 1030       RADIOGRAPHIC STUDIES: I have personally reviewed the radiological images as listed and agreed with the findings in the report. US Carotid Bilateral  Result Date: 08/20/2017 CLINICAL DATA:  Amaurosis fugax of the the right eye. History of hypertension. Former smoker. EXAM: BILATERAL CAROTID DUPLEX ULTRASOUND TECHNIQUE: Pearline Cables scale  imaging, color Doppler and duplex ultrasound were performed of bilateral carotid and vertebral arteries in the neck. COMPARISON:  None. FINDINGS: Criteria: Quantification of carotid stenosis is based on velocity parameters that correlate the residual internal carotid diameter with NASCET-based stenosis levels, using the diameter of the distal internal carotid lumen as the denominator for stenosis measurement. The following velocity measurements were obtained: RIGHT ICA:  87/15 cm/sec CCA:  16/1 cm/sec SYSTOLIC ICA/CCA RATIO:  1.0 DIASTOLIC ICA/CCA RATIO:  2.0 ECA:  122 cm/sec LEFT ICA:  72/12 cm/sec CCA:  09/6 cm/sec SYSTOLIC ICA/CCA RATIO:  0.7 DIASTOLIC ICA/CCA RATIO:  2.4 ECA:  92 cm/sec RIGHT CAROTID ARTERY: There is a moderate amount of echogenic shadowing plaque within the right carotid bulb (Images 5 and 12), extending to involve the origin and proximal aspects of the right internal carotid artery (image 14), not resulting in elevated peak systolic velocities within  the interrogated course the right internal carotid artery to suggest a hemodynamically significant stenosis. RIGHT VERTEBRAL ARTERY:  Antegrade flow LEFT CAROTID ARTERY: There is a minimal amount of atherosclerotic plaque within the left carotid bulb (image 36), extending to involve the origin and proximal aspects of the left internal carotid artery (image 39, not resulting in elevated peak systolic velocities within the interrogated course the left internal carotid artery to suggest a hemodynamically significant stenosis. LEFT VERTEBRAL ARTERY:  Antegrade flow IMPRESSION: Minimal to moderate amount of bilateral atherosclerotic plaque, right greater than left, not resulting in a hemodynamically significant stenosis within either internal carotid artery. Electronically Signed   By: Sandi Mariscal M.D.   On: 08/20/2017 12:20    ASSESSMENT & PLAN:  Mantle cell lymphoma of intra-abdominal lymph nodes His recent imaging study showed no evidence of  disease. He is tolerating reduced dose Ibrutinib well. Continue at 2 tablets daily. We see him back in 6 months for repeat history, physical examination and blood work. I will order annual CT imaging to exclude disease progression, next due March 2019  Fall at home The patient has significant gait imbalance He had recent fall I recommend physical therapy and Occupational Therapy consult  Essential hypertension The patient has significant hypertension Due to recent fall and hypotension, his blood pressure medication was discontinued recently I recommend close blood pressure monitoring and if his blood pressure remain high, he may need to be restarted back on treatment  Thrombocytopenia ([place]) This is likely due to recent treatment. The patient denies recent history of bleeding such as epistaxis, hematuria or hematochezia. He is asymptomatic from the low platelet count. I will observe for now.  he does not require transfusion now. I will continue the chemotherapy at current dose without dosage adjustment.  If the thrombocytopenia gets progressive worse in the future, I might have to delay his treatment or adjust the chemotherapy dose.  Chronic kidney disease, stage III (moderate) This is likely related to aging Recommend risk factor modification  Weight loss He has progressive weight loss due to poor appetite and reduced mobility This could be due to a spectrum related to his dementia Clinically, he does not appear malnourished and serum albumin is within normal limits   Orders Placed This Encounter  Procedures  . CT CHEST W CONTRAST    Standing Status:   Future    Standing Expiration Date:   09/24/2018    Order Specific Question:   If indicated for the ordered procedure, I authorize the administration of contrast media per Radiology protocol    Answer:   Yes    Order Specific Question:   Preferred imaging location?    Answer:   [facility]    Order Specific Question:    Radiology Contrast Protocol - do NOT remove file path    Answer:   \\[email or web address]  . CT ABDOMEN PELVIS W CONTRAST    Standing Status:   Future    Standing Expiration Date:   09/24/2018    Order Specific Question:   If indicated for the ordered procedure, I authorize the administration of contrast media per Radiology protocol    Answer:   Yes    Order Specific Question:   Preferred imaging location?    Answer:   [facility]    Order Specific Question:   Radiology Contrast Protocol - do NOT remove file path    Answer:   \\[email or web address]  . Comprehensive metabolic panel    Standing Status:  Future    Standing Expiration Date:   09/24/2018  . CBC with Differential/Platelet    Standing Status:   Future    Standing Expiration Date:   09/24/2018  . Ambulatory referral to Physical Therapy    Referral Priority:   Routine    Referral Type:   Physical Medicine    Referral Reason:   Specialty Services Required    Requested Specialty:   Physical Therapy    Number of Visits Requested:   1  . Ambulatory referral to Occupational Therapy    Referral Priority:   Routine    Referral Type:   Occupational Therapy    Referral Reason:   Specialty Services Required    Requested Specialty:   Occupational Therapy    Number of Visits Requested:   1   All questions were answered. The patient knows to call the clinic with any problems, questions or concerns. No barriers to learning was detected. I spent 25 minutes counseling the patient face to face. The total time spent in the appointment was 30 minutes and more than 50% was on counseling and review of test results     Heath Lark, MD 08/20/2017 5:32 PM

## 2017-08-20 NOTE — Assessment & Plan Note (Signed)
He has progressive weight loss due to poor appetite and reduced mobility This could be due to a spectrum related to his dementia Clinically, he does not appear malnourished and serum albumin is within normal limits

## 2017-08-20 NOTE — Assessment & Plan Note (Signed)
This is likely due to recent treatment. The patient denies recent history of bleeding such as epistaxis, hematuria or hematochezia. He is asymptomatic from the low platelet count. I will observe for now.  he does not require transfusion now. I will continue the chemotherapy at current dose without dosage adjustment.  If the thrombocytopenia gets progressive worse in the future, I might have to delay his treatment or adjust the chemotherapy dose.   

## 2017-08-20 NOTE — Assessment & Plan Note (Signed)
His recent imaging study showed no evidence of disease. He is tolerating reduced dose Ibrutinib well. Continue at 2 tablets daily. We see him back in 6 months for repeat history, physical examination and blood work. I will order annual CT imaging to exclude disease progression, next due March 2019

## 2017-08-20 NOTE — Telephone Encounter (Signed)
Wife called and left message regarding PT/ OT orders. Called wife back, she said the name of the Facility is [facility]. Phone # [phone] and fax # [phone] to send order for physical therapy and occupational therapy.

## 2017-08-21 ENCOUNTER — Telehealth: Payer: Self-pay | Admitting: *Deleted

## 2017-08-21 NOTE — Telephone Encounter (Signed)
Referral for OT/PT faxed to [facility]

## 2017-08-22 ENCOUNTER — Emergency Department (HOSPITAL_COMMUNITY): Payer: Medicare Other

## 2017-08-22 ENCOUNTER — Inpatient Hospital Stay (HOSPITAL_COMMUNITY)
Admission: EM | Admit: 2017-08-22 | Discharge: 2017-08-24 | DRG: 065 | Disposition: A | Discharge status: Home Health | Payer: Medicare Other | Attending: Internal Medicine | Admitting: Internal Medicine

## 2017-08-22 ENCOUNTER — Encounter (HOSPITAL_COMMUNITY): Payer: Self-pay

## 2017-08-22 DIAGNOSIS — I10 Essential (primary) hypertension: Secondary | ICD-10-CM

## 2017-08-22 DIAGNOSIS — G301 Alzheimer's disease with late onset: Secondary | ICD-10-CM | POA: Diagnosis present

## 2017-08-22 DIAGNOSIS — Z96659 Presence of unspecified artificial knee joint: Secondary | ICD-10-CM | POA: Diagnosis present

## 2017-08-22 DIAGNOSIS — F02818 Dementia in other diseases classified elsewhere, unspecified severity, with other behavioral disturbance: Secondary | ICD-10-CM

## 2017-08-22 DIAGNOSIS — F0281 Dementia in other diseases classified elsewhere with behavioral disturbance: Secondary | ICD-10-CM | POA: Diagnosis present

## 2017-08-22 DIAGNOSIS — Z79899 Other long term (current) drug therapy: Secondary | ICD-10-CM | POA: Diagnosis not present

## 2017-08-22 DIAGNOSIS — E785 Hyperlipidemia, unspecified: Secondary | ICD-10-CM | POA: Diagnosis present

## 2017-08-22 DIAGNOSIS — I351 Nonrheumatic aortic (valve) insufficiency: Secondary | ICD-10-CM | POA: Diagnosis not present

## 2017-08-22 DIAGNOSIS — I340 Nonrheumatic mitral (valve) insufficiency: Secondary | ICD-10-CM | POA: Diagnosis not present

## 2017-08-22 DIAGNOSIS — N183 Chronic kidney disease, stage 3 unspecified: Secondary | ICD-10-CM | POA: Diagnosis present

## 2017-08-22 DIAGNOSIS — H3411 Central retinal artery occlusion, right eye: Secondary | ICD-10-CM | POA: Diagnosis not present

## 2017-08-22 DIAGNOSIS — H538 Other visual disturbances: Secondary | ICD-10-CM | POA: Diagnosis not present

## 2017-08-22 DIAGNOSIS — I639 Cerebral infarction, unspecified: Principal | ICD-10-CM | POA: Diagnosis present

## 2017-08-22 DIAGNOSIS — Z66 Do not resuscitate: Secondary | ICD-10-CM | POA: Diagnosis not present

## 2017-08-22 DIAGNOSIS — Z7982 Long term (current) use of aspirin: Secondary | ICD-10-CM

## 2017-08-22 DIAGNOSIS — D696 Thrombocytopenia, unspecified: Secondary | ICD-10-CM | POA: Diagnosis present

## 2017-08-22 DIAGNOSIS — K219 Gastro-esophageal reflux disease without esophagitis: Secondary | ICD-10-CM | POA: Diagnosis present

## 2017-08-22 DIAGNOSIS — I6521 Occlusion and stenosis of right carotid artery: Secondary | ICD-10-CM | POA: Diagnosis not present

## 2017-08-22 DIAGNOSIS — F0390 Unspecified dementia without behavioral disturbance: Secondary | ICD-10-CM | POA: Diagnosis not present

## 2017-08-22 DIAGNOSIS — H53141 Visual discomfort, right eye: Secondary | ICD-10-CM | POA: Diagnosis not present

## 2017-08-22 DIAGNOSIS — H353211 Exudative age-related macular degeneration, right eye, with active choroidal neovascularization: Secondary | ICD-10-CM | POA: Diagnosis not present

## 2017-08-22 DIAGNOSIS — Z87891 Personal history of nicotine dependence: Secondary | ICD-10-CM

## 2017-08-22 DIAGNOSIS — R29702 NIHSS score 2: Secondary | ICD-10-CM | POA: Diagnosis present

## 2017-08-22 DIAGNOSIS — M316 Other giant cell arteritis: Secondary | ICD-10-CM | POA: Diagnosis not present

## 2017-08-22 DIAGNOSIS — H5461 Unqualified visual loss, right eye, normal vision left eye: Secondary | ICD-10-CM | POA: Diagnosis present

## 2017-08-22 DIAGNOSIS — I129 Hypertensive chronic kidney disease with stage 1 through stage 4 chronic kidney disease, or unspecified chronic kidney disease: Secondary | ICD-10-CM | POA: Diagnosis present

## 2017-08-22 DIAGNOSIS — C8313 Mantle cell lymphoma, intra-abdominal lymph nodes: Secondary | ICD-10-CM | POA: Diagnosis present

## 2017-08-22 LAB — CBC WITH DIFFERENTIAL/PLATELET
BASOS ABS: 0.1 10*3/uL (ref 0.0–0.1)
Basophils Relative: 1 %
EOS ABS: 0.1 10*3/uL (ref 0.0–0.7)
Eosinophils Relative: 2 %
HCT: 39.4 % (ref 39.0–52.0)
Hemoglobin: 13.3 g/dL (ref 13.0–17.0)
LYMPHS ABS: 1.6 10*3/uL (ref 0.7–4.0)
Lymphocytes Relative: 27 %
MCH: 31.4 pg (ref 26.0–34.0)
MCHC: 33.8 g/dL (ref 30.0–36.0)
MCV: 93.1 fL (ref 78.0–100.0)
MONO ABS: 0.4 10*3/uL (ref 0.1–1.0)
Monocytes Relative: 7 %
NEUTROS ABS: 3.8 10*3/uL (ref 1.7–7.7)
Neutrophils Relative %: 63 %
Platelets: UNDETERMINED 10*3/uL (ref 150–400)
RBC: 4.23 MIL/uL (ref 4.22–5.81)
RDW: 14.3 % (ref 11.5–15.5)
WBC: 6 10*3/uL (ref 4.0–10.5)

## 2017-08-22 LAB — COMPREHENSIVE METABOLIC PANEL
ALBUMIN: 3.4 g/dL — AB (ref 3.5–5.0)
ALT: 14 U/L — ABNORMAL LOW (ref 17–63)
ANION GAP: 8 (ref 5–15)
AST: 22 U/L (ref 15–41)
Alkaline Phosphatase: 73 U/L (ref 38–126)
BUN: 25 mg/dL — ABNORMAL HIGH (ref 6–20)
CO2: 23 mmol/L (ref 22–32)
Calcium: 8.5 mg/dL — ABNORMAL LOW (ref 8.9–10.3)
Chloride: 106 mmol/L (ref 101–111)
Creatinine, Ser: 1.53 mg/dL — ABNORMAL HIGH (ref 0.61–1.24)
GFR calc Af Amer: 45 mL/min — ABNORMAL LOW (ref >60)
GFR calc non Af Amer: 39 mL/min — ABNORMAL LOW (ref >60)
GLUCOSE: 89 mg/dL (ref 65–99)
POTASSIUM: 4.7 mmol/L (ref 3.5–5.1)
SODIUM: 137 mmol/L (ref 135–145)
Total Bilirubin: 0.9 mg/dL (ref 0.3–1.2)
Total Protein: 5.2 g/dL — ABNORMAL LOW (ref 6.5–8.1)

## 2017-08-22 LAB — PROTIME-INR
INR: 1.01
PROTHROMBIN TIME: 13.2 s (ref 11.4–15.2)

## 2017-08-22 MED ORDER — LORAZEPAM 2 MG/ML IJ SOLN
1.0000 mg | Freq: Once | INTRAMUSCULAR | Status: AC
  Administered 2017-08-22: 1 mg via INTRAVENOUS
  Filled 2017-08-22: qty 1

## 2017-08-22 MED ORDER — STROKE: EARLY STAGES OF RECOVERY BOOK
Freq: Once | Status: AC
  Administered 2017-08-23: 05:00:00
  Filled 2017-08-22: qty 1

## 2017-08-22 MED ORDER — ENOXAPARIN SODIUM 40 MG/0.4ML ~~LOC~~ SOLN
40.0000 mg | Freq: Every day | SUBCUTANEOUS | Status: DC
  Administered 2017-08-23 – 2017-08-24 (×2): 40 mg via SUBCUTANEOUS
  Filled 2017-08-22 (×2): qty 0.4

## 2017-08-22 MED ORDER — ASPIRIN 300 MG RE SUPP: 300.0000 mg | Freq: Every day | RECTAL | Status: DC

## 2017-08-22 MED ORDER — FAMOTIDINE 20 MG PO TABS: 20.0000 mg | ORAL_TABLET | Freq: Two times a day (BID) | ORAL | Status: DC

## 2017-08-22 MED ORDER — MEMANTINE HCL ER 28 MG PO CP24
28.0000 mg | ORAL_CAPSULE | Freq: Every day | ORAL | Status: DC
  Administered 2017-08-23 – 2017-08-24 (×2): 28 mg via ORAL
  Filled 2017-08-22 (×2): qty 1

## 2017-08-22 MED ORDER — ACETAMINOPHEN 650 MG RE SUPP: 650.0000 mg | RECTAL | Status: DC | PRN

## 2017-08-22 MED ORDER — PROSIGHT PO TABS
1.0000 | ORAL_TABLET | Freq: Every day | ORAL | Status: DC
  Administered 2017-08-23 – 2017-08-24 (×2): 1 via ORAL
  Filled 2017-08-22 (×2): qty 1

## 2017-08-22 MED ORDER — HYDRALAZINE HCL 20 MG/ML IJ SOLN
5.0000 mg | Freq: Once | INTRAMUSCULAR | Status: AC
  Administered 2017-08-22: 5 mg via INTRAVENOUS
  Filled 2017-08-22: qty 1

## 2017-08-22 MED ORDER — ACETAMINOPHEN 160 MG/5ML PO SOLN: 650.0000 mg | ORAL | Status: DC | PRN

## 2017-08-22 MED ORDER — ACETAMINOPHEN 325 MG PO TABS: 650.0000 mg | ORAL_TABLET | ORAL | Status: DC | PRN

## 2017-08-22 MED ORDER — IBRUTINIB 140 MG PO CAPS
280.0000 mg | ORAL_CAPSULE | Freq: Every day | ORAL | Status: DC
  Administered 2017-08-23: 280 mg via ORAL
  Filled 2017-08-22 (×2): qty 2

## 2017-08-22 MED ORDER — SODIUM CHLORIDE 0.9 % IV SOLN
INTRAVENOUS | Status: AC
  Administered 2017-08-23 (×3): via INTRAVENOUS

## 2017-08-22 MED ORDER — ASPIRIN 325 MG PO TABS
325.0000 mg | ORAL_TABLET | Freq: Every day | ORAL | Status: DC
  Administered 2017-08-23 – 2017-08-24 (×2): 325 mg via ORAL
  Filled 2017-08-22 (×2): qty 1

## 2017-08-22 NOTE — ED Provider Notes (Signed)
[facility] DEPT Provider Note   CSN: [id number] Arrival date & time: 08/22/17  1645  History   Chief Complaint No chief complaint on file.   HPI Jeffrey Simmons is a 83 y.o. male.  The patient is an 83yo male with a past medical history significant for mantle cell lymphoma, CKD, thrombocytopenia, who presents to the ED complaining of vision changes to the right eye.  His symptoms began 8 days ago.  He initially had intermittent vision in his right eye throughout last Wednesday, Thursday, and Friday.  He was seen by his PCP last Thursday at which time bilateral carotid Doppler ultrasounds were performed; no results available at this time.  He then lost vision in his right eye on Saturday which he has not regained.  He was sent to the ED today by Opthalmology who sent him to the ED with concerns for a right retinal artery occlusion.  The patient also reports intermittent "giving out" of his left hip, with no associated weakness, numbness, or tingling.  At this time, the patient is complaining of painless vision loss of the right eye.  No headaches, numbness, weakness, or other neurologic symptoms.      The history is provided by medical records, the patient, the spouse and a relative. No language interpreter was used.    Past Medical History:  Diagnosis Date  . Dementia   . GERD (gastroesophageal reflux disease)   . Hematuria 08/23/2014  . Hypertension   . Mantle cell lymphoma of intra-abdominal lymph nodes ([place]) 03/04/2012   Dx 01/07/09 dominant mass R pelvis 4.8 x 2.3 cm  bx R inguinal node 02/01/09 Rx R-CVP x 4 then 4x CNovantrone VP    Patient Active Problem List   Diagnosis Date Noted  . Acute CVA (cerebrovascular accident) ([place]) 08/22/2017  . Stroke (cerebrum) ([place]) 08/22/2017  . Weight loss 08/20/2017  . Chronic kidney disease, stage III (moderate) 02/19/2017  . Essential hypertension 06/09/2015  . Leukopenia due to antineoplastic chemotherapy ([place]) 03/31/2015  . CAP (community  acquired pneumonia) 03/21/2015  . Fall at home 03/18/2015  . Sepsis ([place]) 03/18/2015  . Traumatic hematoma of head 03/18/2015  . Elevated troponin 03/18/2015  . Rhabdomyolysis 03/18/2015  . Seborrheic keratoses, inflamed 02/03/2015  . Hematuria 08/23/2014  . Thrombocytopenia ([place]) 05/04/2014  . Bruising 05/04/2014  . Moderate dementia without behavioral disturbance 01/05/2014  . Mantle cell lymphoma of intra-abdominal lymph nodes ([place]) 03/04/2012    Past Surgical History:  Procedure Laterality Date  . REPLACEMENT TOTAL KNEE  2008       Home Medications    Prior to Admission medications   Medication Sig Start Date End Date Taking? Authorizing Provider  aspirin 81 MG tablet Take 81 mg by mouth daily.   Yes [provider]  ibrutinib (IMBRUVICA) 140 MG capsul Take 2 capsules (280 mg total) by mouth daily. 03/29/17  Yes Gorsuch, Ni, MD  Multiple Vitamins-Minerals (EYE-VITE PLUS LUTEIN) CAPS Take 1 capsule by mouth 2 (two) times daily.   Yes [provider]  NAMENDA XR 28 MG CP24 24 hr capsule Take 28 mg by mouth daily.  02/14/15  Yes [provider]  ranitidine (ZANTAC) 75 MG tablet Take 75 mg by mouth 2 (two) times daily. 2 tablets before am and evening meds   Yes [provider]    Family History Family History  Problem Relation Age of Onset  . Stroke Mother     Social History Social History  Substance Use Topics  . Smoking  status: Former Smoker    Quit date: 11/26/1966  . Smokeless tobacco: Never Used  . Alcohol use Yes     Comment: 1 or 2 beers daily   Allergies   Aricept [donepezil hcl] and Zosyn [piperacillin sod-tazobactam so]  Review of Systems Review of Systems  Constitutional: Negative for chills and fever.  HENT: Negative.   Eyes: Positive for visual disturbance. Negative for pain.  Respiratory: Negative for cough and shortness of breath.   Cardiovascular: Negative for chest pain and palpitations.  Gastrointestinal:  Negative for abdominal pain and vomiting.  Genitourinary: Negative for dysuria and hematuria.  Musculoskeletal: Negative for back pain.  Skin: Negative for color change and rash.  Neurological: Negative for dizziness, tremors, seizures, syncope, facial asymmetry, speech difficulty, weakness, light-headedness, numbness and headaches.  Hematological: Negative.   Psychiatric/Behavioral: Negative.   All other systems reviewed and are negative.   Physical Exam Updated Vital Signs BP (!) 155/70   Pulse 60   Temp 97.7 F (36.5 C)   Resp 13   SpO2 98%   Physical Exam  Constitutional: He appears well-developed and well-nourished. No distress.  HENT:  Head: Normocephalic and atraumatic.  Mouth/Throat: Oropharynx is clear and moist.  Eyes: Conjunctivae are normal.  OS 20/40 OD: patient can determine whether or a not a hand in front of his face is moving, however unable to perform visual acuity exam  OU 20/40  Neck: Neck supple.  Cardiovascular: Normal rate, regular rhythm, normal heart sounds and intact distal pulses.   No murmur heard. Pulmonary/Chest: Effort normal and breath sounds normal. No respiratory distress.  Abdominal: Soft. There is no tenderness.  Musculoskeletal: He exhibits no edema.  Neurological: He is alert. No sensory deficit. He exhibits normal muscle tone. Coordination normal.  answering all questions and following commands appropriately; right pupil fixed  Skin: Skin is warm and dry.  Psychiatric: He has a normal mood and affect.  Nursing note and vitals reviewed.  ED Treatments / Results  Labs (all labs ordered are listed, but only abnormal results are displayed) Labs Reviewed  COMPREHENSIVE METABOLIC PANEL - Abnormal; Notable for the following:       Result Value   BUN 25 (*)    Creatinine, Ser 1.53 (*)    Calcium 8.5 (*)    Total Protein 5.2 (*)    Albumin 3.4 (*)    ALT 14 (*)    GFR calc non Af Amer 39 (*)    GFR calc Af Amer 45 (*)    All other  components within normal limits  CBC WITH DIFFERENTIAL/PLATELET  PROTIME-INR  HEMOGLOBIN A1C  LIPID PANEL  CBC  CREATININE, SERUM  CBC  COMPREHENSIVE METABOLIC PANEL   EKG  EKG Interpretation None       Radiology Ct Head Wo Contrast  Result Date: 08/22/2017 CLINICAL DATA:  Fuzziness right eye 1 week.  Dementia. EXAM: CT HEAD WITHOUT CONTRAST TECHNIQUE: Contiguous axial images were obtained from the base of the skull through the vertex without intravenous contrast. COMPARISON:  03/18/2015 FINDINGS: Brain: Ventricles, cisterns and other CSF spaces are within normal. There is chronic ischemic microvascular disease present. No evidence of mass, mass effect, shift of midline structures or acute hemorrhage. No acute infarction. Vascular: No hyperdense vessel or unexpected calcification. Skull: Normal. Negative for fracture or focal lesion. Sinuses/Orbits: The orbits are normal. Small polyp versus mucous retention cyst over the floor the right maxillary sinus. Other: None. IMPRESSION: No acute intracranial findings. Mild chronic ischemic microvascular disease. Electronically  Signed   By: Marin Olp M.D.   On: 08/22/2017 20:24    Procedures Procedures (including critical care time)  Medications Ordered in ED Medications  famotidine (PEPCID) tablet 20 mg (not administered)  ibrutinib (IMBRUVICA) capsule 280 mg (not administered)  memantine (NAMENDA XR) 24 hr capsule 28 mg (not administered)  EYE-VITE PLUS LUTEIN CAPS 1 capsule (not administered)   stroke: mapping our early stages of recovery book (not administered)  0.9 %  sodium chloride infusion (not administered)  acetaminophen (TYLENOL) tablet 650 mg (not administered)    Or  acetaminophen (TYLENOL) solution 650 mg (not administered)    Or  acetaminophen (TYLENOL) suppository 650 mg (not administered)  enoxaparin (LOVENOX) injection 40 mg (not administered)  aspirin suppository 300 mg (not administered)    Or  aspirin tablet  325 mg (not administered)  hydrALAZINE (APRESOLINE) injection 5 mg (5 mg Intravenous Given 08/22/17 2127)  LORazepam (ATIVAN) injection 1 mg (1 mg Intravenous Given 08/22/17 2207)    Initial Impression / Assessment and Plan / ED Course  I have reviewed the triage vital signs and the nursing notes.  Pertinent labs & imaging results that were available during my care of the patient were reviewed by me and considered in my medical decision making (see chart for details).    Initial differential diagnosis included CVA, CRAO, and carotid artery stenosis.  Pertinent labs included an unremarkable CBC.  CMP with creatinine 1.53, consistent with the patient's baseline.  Normal coags.  Imaging studies included a head CT with no acute intracranial abnormalities.  I consulted Neurology who evaluated the patient in the ED and recommended further imaging studies.   MRA brain, head, and neck pending.  The patient was given hydralazine for hypertension and Ativan for agitation.  Upon reassessment, he had no new complaints or symptoms.  Based on the above findings, I suspect the patient most likely has a central retinal artery occlusion at this time.  MRI is pending to further evaluate for a CVA and evaluate the extent of neurological involvement.  The patient was admitted to the Hospitalist service in stable condition for further stroke work-up and evaluation.  Final Clinical Impressions(s) / ED Diagnoses   Final diagnoses:  Cerebrovascular accident (CVA), unspecified mechanism [facility])   New Prescriptions New Prescriptions   No medications on file     Charisse March, MD 08/23/17 0128    Drenda Freeze, MD 08/24/17 [phone]

## 2017-08-22 NOTE — H&P (Signed)
History and Physical    Jeffrey Simmons WPV:[id number] DOB: 20-Apr-1927 DOA: 08/22/2017  PCP: Lajean Manes, MD  Patient coming from: Home.  Chief Complaint: Right eye vision loss.  HPI: Jeffrey Simmons is a 83 y.o. male with history of mantle cell lymphoma, chronic kidney disease, chronic thrombocytopenia, hypertension, dementia has been experiencing some blurred vision in the right eye over the last 1 week. Patient's symptoms started last Wednesday with blurriness of the vision on the right eye which continued for next 2 days and had gone to his PCP. At the PCPs office patient blood pressure was found to be low and his antihypertensives were stopped. Carotid Dopplers were ordered which was done 2 days ago which did not show any hemodynamically significant obstruction. Following the PCPs office visit patient patient became more words and became completely blind. Patient had followed up with his ophthalmologist today and was referred to the ER for concern for stroke. Patient had his left leg giveaway 2 days ago but otherwise he is not weak or any extremities. Denies any difficulty swallowing or speaking.   ED Course: In the ER patient had CT of the head which was unremarkable. Patient was evaluated by the neurologist and is being admitted for stroke workup.  Review of Systems: As per HPI, rest all negative.   Past Medical History:  Diagnosis Date  . Dementia   . GERD (gastroesophageal reflux disease)   . Hematuria 08/23/2014  . Hypertension   . Mantle cell lymphoma of intra-abdominal lymph nodes ([place]) 03/04/2012   Dx 01/07/09 dominant mass R pelvis 4.8 x 2.3 cm  bx R inguinal node 02/01/09 Rx R-CVP x 4 then 4x CNovantrone VP    Past Surgical History:  Procedure Laterality Date  . REPLACEMENT TOTAL KNEE  2008     reports that he quit smoking about 50 years ago. He has never used smokeless tobacco. He reports that he drinks alcohol. He reports that he does not use drugs.  Allergies  Allergen  Reactions  . Aricept [Donepezil Hcl] Other (See Comments)    Unknown   . Zosyn [Piperacillin Sod-Tazobactam So] Rash    Pink macular rash on back, chest, shoulders    Family History  Problem Relation Age of Onset  . Stroke Mother     Prior to Admission medications   Medication Sig Start Date End Date Taking? Authorizing Provider  aspirin 81 MG tablet Take 81 mg by mouth daily.   Yes [provider]  ibrutinib (IMBRUVICA) 140 MG capsul Take 2 capsules (280 mg total) by mouth daily. 03/29/17  Yes Gorsuch, Ni, MD  Multiple Vitamins-Minerals (EYE-VITE PLUS LUTEIN) CAPS Take 1 capsule by mouth 2 (two) times daily.   Yes [provider]  NAMENDA XR 28 MG CP24 24 hr capsule Take 28 mg by mouth daily.  02/14/15  Yes [provider]  ranitidine (ZANTAC) 75 MG tablet Take 75 mg by mouth 2 (two) times daily. 2 tablets before am and evening meds   Yes [provider]    Physical Exam: Vitals:   08/22/17 2045 08/22/17 2114 08/22/17 2128 08/22/17 2200  BP: (!) 212/78 (!) 190/81  (!) 171/69  Pulse:  (!) 58  60  Resp:  19  (!) 23  Temp:   97.7 F (36.5 C)   TempSrc:      SpO2:  100%  98%      Constitutional: Moderately built and nourished. Vitals:   08/22/17 2045 08/22/17 2114 08/22/17 2128 08/22/17 2200  BP: (!) 212/78 (!) 190/81  (!) 171/69  Pulse:  (!) 58  60  Resp:  19  (!) 23  Temp:   97.7 F (36.5 C)   TempSrc:      SpO2:  100%  98%   Eyes: Anicteric no pallor. Right eye is having poor vision. ENMT: No discharge from the ears eyes nose or mouth. Neck: No mass felt. No JVD appreciated. Respiratory: No rhonchi or crepitations. Cardiovascular: S1-S2 heard no murmurs appreciated. Abdomen: Soft nontender bowel sounds present. No guarding or rigidity. Musculoskeletal: No edema. No joint effusion. Skin: No rash. Skin appears warm. Neurologic: Alert awake oriented to time place and person. Moves all extremities 5 x 5. No facial asymmetry. Right  eye is having poor vision. Tongue is midline. Psychiatric: Appears normal. Normal affect.   Labs on Admission: I have personally reviewed following labs and imaging studies  CBC:  Recent Labs Lab 08/19/17 1030 08/22/17 1707  WBC 5.9 6.0  NEUTROABS 3.9 3.8  HGB 14.5 13.3  HCT 42.6 39.4  MCV 94.2 93.1  PLT 96* PLATELET CLUMPS NOTED ON SMEAR, UNABLE TO ESTIMATE   Basic Metabolic Panel:  Recent Labs Lab 08/19/17 1030 08/22/17 1707  NA 140 137  K 4.6 4.7  CL  --  106  CO2 27 23  GLUCOSE 78 89  BUN 20.9 25*  CREATININE 1.4* 1.53*  CALCIUM 9.1 8.5*   GFR: Estimated Creatinine Clearance: 34.4 mL/min (A) (by C-G formula based on SCr of 1.53 mg/dL (H)). Liver Function Tests:  Recent Labs Lab 08/19/17 1030 08/22/17 1707  AST 16 22  ALT 10 14*  ALKPHOS 89 73  BILITOT 1.19 0.9  PROT 5.9* 5.2*  ALBUMIN 3.6 3.4*   No results for input(s): LIPASE, AMYLASE in the last 168 hours. No results for input(s): AMMONIA in the last 168 hours. Coagulation Profile:  Recent Labs Lab 08/22/17 1707  INR 1.01   Cardiac Enzymes: No results for input(s): CKTOTAL, CKMB, CKMBINDEX, TROPONINI in the last 168 hours. BNP (last 3 results) No results for input(s): PROBNP in the last 8760 hours. HbA1C: No results for input(s): HGBA1C in the last 72 hours. CBG: No results for input(s): GLUCAP in the last 168 hours. Lipid Profile: No results for input(s): CHOL, HDL, LDLCALC, TRIG, CHOLHDL, LDLDIRECT in the last 72 hours. Thyroid Function Tests: No results for input(s): TSH, T4TOTAL, FREET4, T3FREE, THYROIDAB in the last 72 hours. Anemia Panel: No results for input(s): VITAMINB12, FOLATE, FERRITIN, TIBC, IRON, RETICCTPCT in the last 72 hours. Urine analysis:    Component Value Date/Time   COLORURINE AMBER (A) 03/18/2015 0412   APPEARANCEUR CLEAR 03/18/2015 0412   LABSPEC 1.018 03/18/2015 0412   LABSPEC 1.025 08/25/2014 1035   PHURINE 5.5 03/18/2015 0412   GLUCOSEU NEGATIVE  03/18/2015 0412   GLUCOSEU Negative 08/25/2014 1035   HGBUR MODERATE (A) 03/18/2015 0412   BILIRUBINUR NEGATIVE 03/18/2015 0412   BILIRUBINUR Negative 08/25/2014 1035   KETONESUR NEGATIVE 03/18/2015 0412   PROTEINUR 100 (A) 03/18/2015 0412   UROBILINOGEN 1.0 03/18/2015 0412   UROBILINOGEN 0.2 08/25/2014 1035   NITRITE NEGATIVE 03/18/2015 0412   LEUKOCYTESUR NEGATIVE 03/18/2015 0412   LEUKOCYTESUR Trace 08/25/2014 1035   Sepsis Labs: @LABRCNTIP (procalcitonin:4,lacticidven:4) )No results found for this or any previous visit (from the past 240 hour(s)).   Radiological Exams on Admission: Ct Head Wo Contrast  Result Date: 08/22/2017 CLINICAL DATA:  Fuzziness right eye 1 week.  Dementia. EXAM: CT HEAD WITHOUT CONTRAST TECHNIQUE: Contiguous axial images were obtained  from the base of the skull through the vertex without intravenous contrast. COMPARISON:  03/18/2015 FINDINGS: Brain: Ventricles, cisterns and other CSF spaces are within normal. There is chronic ischemic microvascular disease present. No evidence of mass, mass effect, shift of midline structures or acute hemorrhage. No acute infarction. Vascular: No hyperdense vessel or unexpected calcification. Skull: Normal. Negative for fracture or focal lesion. Sinuses/Orbits: The orbits are normal. Small polyp versus mucous retention cyst over the floor the right maxillary sinus. Other: None. IMPRESSION: No acute intracranial findings. Mild chronic ischemic microvascular disease. Electronically Signed   By: Marin Olp M.D.   On: 08/22/2017 20:24     Assessment/Plan Principal Problem:   Acute CVA (cerebrovascular accident) [facility]) Active Problems:   Mantle cell lymphoma of intra-abdominal lymph nodes (H[facility])   Thrombocytopenia (HCC)   Essential hypertension   Chronic kidney disease, stage III (moderate)   Stroke (cerebrum) ([place])    1. Acute CVA - patient's symptoms are consistent with CVA. MRI brain and MRA of the brain and neck has been  ordered. Check 2-D echo. Monitor shows normal sinus rhythm. EKG is pending. Check hemoglobin A1c and lipid panel physical therapy consult. Patient is on aspirin. 2. Uncontrolled hypertension - patient recently had fluctuating blood pressures and antihypertensives were stopped. At this time since patient has possible acute stroke we will allow for permissive hypertension. Patient is on when necessary IV hydralazine for systolic blood pressure more than 544 and diastolic more than 920. 3. Mantle cell lymphoma - will continue Ibrutinib. 4. Chronic thrombocytopenia - follow CBC. 5. Chronic kidney disease stage III - creatinine appears to be at baseline. 6. Dementia on Namenda.  I have reviewed patient's old charts and labs.   DVT prophylaxis: Lovenox. Code Status: DO NOT RESUSCITATE.  Family Communication: Discussed with patient's wife and son.  Disposition Plan: Home.  Consults called: Neurology.  Admission status: Inpatient.    Rise Patience MD [facility] Pager [phone].  If 7PM-7AM, please contact night-coverage [email or web address] Password [facility]  08/22/2017, 10:41 PM

## 2017-08-22 NOTE — ED Notes (Signed)
Neurology at bedside.

## 2017-08-22 NOTE — ED Notes (Addendum)
Patient sent from Opthalmology for confirmed Central Retinal Occlusion

## 2017-08-22 NOTE — ED Notes (Signed)
Provider at bedside

## 2017-08-22 NOTE — ED Notes (Signed)
Pt in CT.

## 2017-08-22 NOTE — Consult Note (Signed)
Requesting Physician: Dr. Kenton Kingfisher    Chief Complaint: Right eye vision loss  History obtained from: Chart review  HPI:                                                                                                                                       Jeffrey Simmons is an 83 y.o. male past medical history of hypertension,lymphoma, thrombocytopenia, chronic kidney disease dementia who presents to the emergency room for right eye vision loss that occurred on Saturday.    the patient is a poor historian due to dementia and history of was obtained by chart review CRAO. It appears the patient's symptoms began 8 days ago with intermittent right eye vision loss that started on Wednesday and occurred multiple times including Thursday and Friday. He was seen by  PCP who ordered carotid Doppler. Carotid Doppler was apparently for performed however unable to review the results. Then on Saturday the patient had complete loss of right eye and family saw an ophthalmologist today who was concerned the patient had A CRAO. Patient was sent to the emergency room for stroke evaluation     Past Medical History:  Diagnosis Date  . Dementia   . GERD (gastroesophageal reflux disease)   . Hematuria 08/23/2014  . Hypertension   . Mantle cell lymphoma of intra-abdominal lymph nodes ([place]) 03/04/2012   Dx 01/07/09 dominant mass R pelvis 4.8 x 2.3 cm  bx R inguinal node 02/01/09 Rx R-CVP x 4 then 4x CNovantrone VP    Past Surgical History:  Procedure Laterality Date  . REPLACEMENT TOTAL KNEE  2008    Family History  Problem Relation Age of Onset  . Stroke Mother    Social History:  reports that he quit smoking about 50 years ago. He has never used smokeless tobacco. He reports that he drinks alcohol. He reports that he does not use drugs.  Allergies:  Allergies  Allergen Reactions  . Aricept [Donepezil Hcl] Other (See Comments)    Unknown   . Zosyn [Piperacillin Sod-Tazobactam So] Rash    Pink macular rash on  back, chest, shoulders    Medications:                                                                                                                           I reviewed his medications  ROS:  14 systems reviewed and negative   Examination:                                                                                                      General: Appears well-developed and well-nourished.  Psych: Affect appropriate to situation Eyes: No scleral injection HENT: No OP obstrucion Head: Normocephalic.  Cardiovascular: Normal rate and regular rhythm.  Respiratory: Effort normal and breath sounds normal to anterior ascultation GI: Soft.  No distension. There is no tenderness.  Skin: WDI   Neurological Examination Mental Status: Alert, oriented to name only.  Speech fluent without evidence of aphasia.  Able to follow 3 step commands without difficulty. Cranial Nerves: II: Right eye vision loss, pupil fixed.  unable to finger count . Left eye visual fields intact  III,IV, VI: ptosis not present, extra-ocular motions intact bilaterally, pupils equal, round, reactive to light and accommodation V,VII: smile symmetric, facial light touch sensation normal bilaterally VIII: hearing normal bilaterally IX,X: uvula rises symmetrically XI: bilateral shoulder shrug XII: midline tongue extension Motor: Right : Upper extremity   5/5    Left:     Upper extremity   5/5  Lower extremity   5/5     Lower extremity   5/5 Tone and bulk:normal tone throughout; no atrophy noted Sensory: Pinprick and light touch intact throughout, bilaterally Deep Tendon Reflexes: 2+ and symmetric throughout Plantars: Right: downgoing   Left: downgoing Cerebellar: normal finger-to-nose, normal rapid alternating movements and normal heel-to-shin test Gait: normal gait and station      Lab Results: Basic Metabolic Panel:  Recent Labs Lab 08/19/17 1030 08/22/17 1707  NA 140 137  K 4.6 4.7  CL  --  106  CO2 27 23  GLUCOSE 78 89  BUN 20.9 25*  CREATININE 1.4* 1.53*  CALCIUM 9.1 8.5*    CBC:  Recent Labs Lab 08/19/17 1030 08/22/17 1707  WBC 5.9 6.0  NEUTROABS 3.9 3.8  HGB 14.5 13.3  HCT 42.6 39.4  MCV 94.2 93.1  PLT 96* PLATELET CLUMPS NOTED ON SMEAR, UNABLE TO ESTIMATE    Coagulation Studies:  Recent Labs  08/22/17 1707  LABPROT 13.2  INR 1.01    Imaging: Ct Head Wo Contrast  Result Date: 08/22/2017 CLINICAL DATA:  Fuzziness right eye 1 week.  Dementia. EXAM: CT HEAD WITHOUT CONTRAST TECHNIQUE: Contiguous axial images were obtained from the base of the skull through the vertex without intravenous contrast. COMPARISON:  03/18/2015 FINDINGS: Brain: Ventricles, cisterns and other CSF spaces are within normal. There is chronic ischemic microvascular disease present. No evidence of mass, mass effect, shift of midline structures or acute hemorrhage. No acute infarction. Vascular: No hyperdense vessel or unexpected calcification. Skull: Normal. Negative for fracture or focal lesion. Sinuses/Orbits: The orbits are normal. Small polyp versus mucous retention cyst over the floor the right maxillary sinus. Other: None. IMPRESSION: No acute intracranial findings. Mild chronic ischemic microvascular disease. Electronically Signed   By: Marin Olp M.D.   On: 08/22/2017 20:24     ASSESSMENT AND PLAN  83yo male with a past medical history  significant for mantle cell lymphoma, CKD, thrombocytopenia, dementia with multiple episodes of transient vision loss in the right eye and permanent vision loss on Saturday. He was seen by ophthalmologist today who diagnosed him with right CRAO and advised him to come to the emergency room. The patient needs admission for evaluation of stroke risk factors and to rule out carotid disease for which she needs MR angiogram. He is  outside the window for any intervention for CRAO.  Right CRAO   Recommend # MRI of the brain without contrast #MRA Head and neck  #Transthoracic Echo  # Start patient on ASA 325 mg, Platelets are 96 which should be okay. Repeat CBC in morning.  #Start or continue Atorvastatin 80 mg/other high intensity statin # BP goal: permissive HTN upto 258 systolic, PRNs above 527 # HBAIC and Lipid profile # Telemetry monitoring # Frequent neuro checks # NPO until passes stroke swallow screen  Please page stroke NP  Or  PA  Or MD from 8am -4 pm  as this patient from this time will be  followed by the stroke.   You can look them up on [email or web address]  Password [facility]    Alan Riles [facility] Pager Number [phone]

## 2017-08-22 NOTE — ED Triage Notes (Signed)
Pt presents with "fuzziness" to R eye x 1 week ago that was treated with injections that was improving but getting worse over the past few days.  Wife also reports pt has had weakness to L hip x 5 days ago.

## 2017-08-22 NOTE — ED Notes (Signed)
Pt agitated with family in room; MD aware

## 2017-08-23 ENCOUNTER — Inpatient Hospital Stay (HOSPITAL_COMMUNITY): Payer: Medicare Other

## 2017-08-23 ENCOUNTER — Ambulatory Visit (HOSPITAL_COMMUNITY): Payer: Medicare Other

## 2017-08-23 DIAGNOSIS — H3411 Central retinal artery occlusion, right eye: Secondary | ICD-10-CM

## 2017-08-23 DIAGNOSIS — I351 Nonrheumatic aortic (valve) insufficiency: Secondary | ICD-10-CM

## 2017-08-23 DIAGNOSIS — N183 Chronic kidney disease, stage 3 (moderate): Secondary | ICD-10-CM

## 2017-08-23 DIAGNOSIS — I340 Nonrheumatic mitral (valve) insufficiency: Secondary | ICD-10-CM

## 2017-08-23 DIAGNOSIS — C8313 Mantle cell lymphoma, intra-abdominal lymph nodes: Secondary | ICD-10-CM

## 2017-08-23 DIAGNOSIS — F0281 Dementia in other diseases classified elsewhere with behavioral disturbance: Secondary | ICD-10-CM

## 2017-08-23 DIAGNOSIS — F02818 Dementia in other diseases classified elsewhere, unspecified severity, with other behavioral disturbance: Secondary | ICD-10-CM

## 2017-08-23 DIAGNOSIS — G301 Alzheimer's disease with late onset: Secondary | ICD-10-CM

## 2017-08-23 DIAGNOSIS — D696 Thrombocytopenia, unspecified: Secondary | ICD-10-CM

## 2017-08-23 LAB — CREATININE, SERUM
Creatinine, Ser: 1.28 mg/dL — ABNORMAL HIGH (ref 0.61–1.24)
GFR calc Af Amer: 56 mL/min — ABNORMAL LOW (ref >60)
GFR calc non Af Amer: 48 mL/min — ABNORMAL LOW (ref >60)

## 2017-08-23 LAB — ECHOCARDIOGRAM COMPLETE
AVPHT: 590 ms
CHL CUP MV DEC (S): 261
CHL CUP TV REG PEAK VELOCITY: 214 cm/s
E decel time: 261 msec
EERAT: 9.16
FS: 28 % (ref 28–44)
HEIGHTINCHES: 73 in
IVS/LV PW RATIO, ED: 1.22
LA vol A4C: 80 ml
LA vol: 73.1 mL
LADIAMINDEX: 2.64 cm/m2
LASIZE: 50 mm
LAVOLIN: 38.6 mL/m2
LDCA: 3.8 cm2
LEFT ATRIUM END SYS DIAM: 50 mm
LV E/e'average: 9.16
LV PW d: 9 mm — AB (ref 0.6–1.1)
LV e' LATERAL: 6.81 cm/s
LVEEMED: 9.16
LVOT SV: 105 mL
LVOT VTI: 27.5 cm
LVOT diameter: 22 mm
LVOT peak grad rest: 5 mmHg
LVOT peak vel: 116 cm/s
MV pk A vel: 71.1 m/s
MVPKEVEL: 62.4 m/s
RV LATERAL S' VELOCITY: 11.4 cm/s
RV TAPSE: 19.4 mm
RV sys press: 21 mmHg
TDI e' lateral: 6.81
TDI e' medial: 6.57
TRMAXVEL: 214 cm/s
WEIGHTICAEL: 2475.2 [oz_av]

## 2017-08-23 LAB — COMPREHENSIVE METABOLIC PANEL
ALK PHOS: 70 U/L (ref 38–126)
ALT: 13 U/L — AB (ref 17–63)
ANION GAP: 8 (ref 5–15)
AST: 19 U/L (ref 15–41)
Albumin: 3.5 g/dL (ref 3.5–5.0)
BILIRUBIN TOTAL: 1.2 mg/dL (ref 0.3–1.2)
BUN: 18 mg/dL (ref 6–20)
CO2: 23 mmol/L (ref 22–32)
CREATININE: 1.16 mg/dL (ref 0.61–1.24)
Calcium: 8.5 mg/dL — ABNORMAL LOW (ref 8.9–10.3)
Chloride: 108 mmol/L (ref 101–111)
GFR, EST NON AFRICAN AMERICAN: 54 mL/min — AB (ref >60)
Glucose, Bld: 94 mg/dL (ref 65–99)
Potassium: 3.9 mmol/L (ref 3.5–5.1)
SODIUM: 139 mmol/L (ref 135–145)
TOTAL PROTEIN: 5.2 g/dL — AB (ref 6.5–8.1)

## 2017-08-23 LAB — C-REACTIVE PROTEIN

## 2017-08-23 LAB — CBC
HCT: 40.9 % (ref 39.0–52.0)
HEMOGLOBIN: 13.9 g/dL (ref 13.0–17.0)
MCH: 31.2 pg (ref 26.0–34.0)
MCHC: 34 g/dL (ref 30.0–36.0)
MCV: 91.7 fL (ref 78.0–100.0)
Platelets: 86 10*3/uL — ABNORMAL LOW (ref 150–400)
RBC: 4.46 MIL/uL (ref 4.22–5.81)
RDW: 14 % (ref 11.5–15.5)
WBC: 5.8 10*3/uL (ref 4.0–10.5)

## 2017-08-23 LAB — LIPID PANEL
CHOLESTEROL: 179 mg/dL (ref 0–200)
HDL: 36 mg/dL — ABNORMAL LOW (ref >40)
LDL Cholesterol: 117 mg/dL — ABNORMAL HIGH (ref 0–99)
TRIGLYCERIDES: 132 mg/dL (ref <150)
Total CHOL/HDL Ratio: 5 RATIO
VLDL: 26 mg/dL (ref 0–40)

## 2017-08-23 LAB — SEDIMENTATION RATE: SED RATE: 1 mm/h (ref 0–16)

## 2017-08-23 LAB — HEMOGLOBIN A1C
Hgb A1c MFr Bld: 4.7 % — ABNORMAL LOW (ref 4.8–5.6)
MEAN PLASMA GLUCOSE: 88.19 mg/dL

## 2017-08-23 LAB — GLUCOSE, CAPILLARY: GLUCOSE-CAPILLARY: 119 mg/dL — AB (ref 65–99)

## 2017-08-23 MED ORDER — HYDRALAZINE HCL 20 MG/ML IJ SOLN: 10.0000 mg | INTRAMUSCULAR | Status: DC | PRN

## 2017-08-23 MED ORDER — GADOBENATE DIMEGLUMINE 529 MG/ML IV SOLN
15.0000 mL | Freq: Once | INTRAVENOUS | Status: AC | PRN
  Administered 2017-08-23: 15 mL via INTRAVENOUS

## 2017-08-23 MED ORDER — ATORVASTATIN CALCIUM 20 MG PO TABS
20.0000 mg | ORAL_TABLET | Freq: Every day | ORAL | Status: DC
  Administered 2017-08-23: 20 mg via ORAL
  Filled 2017-08-23: qty 1

## 2017-08-23 MED ORDER — FAMOTIDINE 20 MG PO TABS
20.0000 mg | ORAL_TABLET | Freq: Every day | ORAL | Status: DC
  Filled 2017-08-23: qty 1

## 2017-08-23 MED ORDER — LORAZEPAM 2 MG/ML IJ SOLN
1.0000 mg | Freq: Once | INTRAMUSCULAR | Status: AC | PRN
  Administered 2017-08-23: 1 mg via INTRAVENOUS
  Filled 2017-08-23: qty 1

## 2017-08-23 MED ORDER — FAMOTIDINE 20 MG PO TABS
20.0000 mg | ORAL_TABLET | Freq: Every day | ORAL | Status: DC
  Administered 2017-08-23 – 2017-08-24 (×2): 20 mg via ORAL
  Filled 2017-08-23: qty 1

## 2017-08-23 NOTE — ED Notes (Signed)
Pt continually trying to climb out of bed, reports being uncomfortable. Hospital bed ordered for pt. Toileting offered

## 2017-08-23 NOTE — Progress Notes (Signed)
  Echocardiogram 2D Echocardiogram has been performed.  Jeffrey Simmons 08/23/2017, 5:51 PM

## 2017-08-23 NOTE — ED Notes (Signed)
Pt taken to MRI  

## 2017-08-23 NOTE — Evaluation (Signed)
Physical Therapy Evaluation Patient Details Name: Jeffrey Simmons MRN: [id number] DOB: 1927/01/20 Today's Date: 08/23/2017   History of Present Illness  Jeffrey Simmons is a 83 y.o. male with history of mantle cell lymphoma, CKD, chronic thrombocytopenia, HTN, dementia has been experiencing some intermittent blurred vision in the right eye with one episode of complete vision loss over the last 1 week. MRI: Punctate acute/early subacute small vessel infarct within left parietal subcortical white matter  Clinical Impression  Pt admitted with/for episode of vision loss over the last 1 week.  MRI showed punctated acute/early subacute infarct in left parietal/ subcortical white matter.  Pt needing a min guard level for mobility a time of evaluation .  Pt currently limited functionally due to the problems listed below.  (see problems list.)  Pt will benefit from PT to maximize function and safety to be able to get home safely with available assist..     Follow Up Recommendations Home health PT    Equipment Recommendations       Recommendations for Other Services       Precautions / Restrictions Precautions Precautions: Fall      Mobility  Bed Mobility               General bed mobility comments: not tested  Transfers Overall transfer level: Needs assistance                  Ambulation/Gait                Stairs            Wheelchair Mobility    Modified Rankin (Stroke Patients Only)       Balance                                             Pertinent Vitals/Pain Pain Assessment: No/denies pain    Home Living Family/patient expects to be discharged to:: Private residence Living Arrangements: Spouse/significant other Available Help at Discharge: Family;Available 24 hours/day Type of Home: Independent living facility Home Access: Level entry     Home Layout: One level Home Equipment: Cane - single point;Walker - 2 wheels;Hand  held shower head;Grab bars - toilet;Grab bars - tub/shower      Prior Function Level of Independence: Needs assistance   Gait / Transfers Assistance Needed: does not normally use an AD  ADL's / Homemaking Assistance Needed: pt manages his basic ADL's per his wife, "I just have to fix food and put it in front of him."  Comments: S due to dementia; he does all of his bathing/dressing/toileting; does not use AD-takes small steps per wife and holds onto furniture/walls if near by     Hand Dominance   Dominant Hand: Right    Extremity/Trunk Assessment   Upper Extremity Assessment Upper Extremity Assessment: Defer to OT evaluation    Lower Extremity Assessment Lower Extremity Assessment: Overall WFL for tasks assessed    Cervical / Trunk Assessment Cervical / Trunk Assessment: Kyphotic  Communication   Communication: No difficulties  Cognition Arousal/Alertness: Awake/alert Behavior During Therapy: WFL for tasks assessed/performed Overall Cognitive Status: History of cognitive impairments - at baseline  General Comments      Exercises     Assessment/Plan    PT Assessment All further PT needs can be met in the next venue of care  PT Problem List Decreased activity tolerance;Decreased balance;Decreased mobility;Decreased coordination;Pain       PT Treatment Interventions      PT Goals (Current goals can be found in the Care Plan section)  Acute Rehab PT Goals Patient Stated Goal: wife is hopeful for home today/tomorrow PT Goal Formulation: With patient Time For Goal Achievement: 08/30/17 Potential to Achieve Goals: Good    Frequency     Barriers to discharge        Co-evaluation               AM-PAC PT "6 Clicks" Daily Activity  Outcome Measure Difficulty turning over in bed (including adjusting bedclothes, sheets and blankets)?: A Little Difficulty moving from lying on back to sitting on the side  of the bed? : A Little Difficulty sitting down on and standing up from a chair with arms (e.g., wheelchair, bedside commode, etc,.)?: A Little Help needed moving to and from a bed to chair (including a wheelchair)?: A Little Help needed walking in hospital room?: A Little Help needed climbing 3-5 steps with a railing? : A Little 6 Click Score: 18    End of Session   Activity Tolerance: Patient tolerated treatment well Patient left: in bed;with call bell/phone within reach Nurse Communication: Mobility status PT Visit Diagnosis: Unsteadiness on feet (R26.81)    Time: 1188-6773 PT Time Calculation (min) (ACUTE ONLY): 22 min   Charges:   PT Evaluation $PT Eval Moderate Complexity: 1 Mod     PT G Codes:        Aug 31, 2017  Donnella Sham, PT [phone]  (pager)  Tessie Fass Vendela Troung 2017-08-31, 5:52 PM

## 2017-08-23 NOTE — Progress Notes (Addendum)
STROKE TEAM PROGRESS NOTE   HISTORY OF PRESENT ILLNESS (per record) Jeffrey Simmons is an 83 y.o. male past medical history of hypertension, lymphoma (currently on oral chemotherapy), thrombocytopenia, chronic kidney disease dementia who presents to the emergency room for right eye vision loss that occurred on Saturday.  The patient is a poor historian due to dementia and history of was obtained by chart review CRAO. It appears the patient's symptoms began 8 days ago with intermittent right eye vision loss that started on Wednesday and occurred multiple times including Thursday and Friday. He was seen by PCP who ordered carotid Doppler. Carotid Doppler was performed, but result were not available in the EHR.  On Saturday the patient had complete loss of right eye and family saw an ophthalmologist today who was concerned the patient had a CRAO. Patient was sent to the emergency room for stroke evaluation.  Patient was not administered IV t-PA secondary to presenting with minimal symptoms/arriving outside of the treatment window. He was admitted to General Neurology for further evaluation and treatment.   SUBJECTIVE (INTERVAL HISTORY) His wife is at the bedside.  The patient is sitting up in the chair.  His wife reports that the pt has been seeing an eye doctor (Dr. Posey Pronto), and getting injections in his R eye for "clouding behind the retina" on a monthly basis for the past four months. Pt has demential and not able to provide more hx. Wife stated that pt seems to have on and off right eye vision loss since one week ago. Last weekend, he had one episode of right eye vision total loss for 7 hours. However, pt sleeps a lot during the day so she does not know how pt vision actually changes during this time.   I then called Dr. Posey Pronto and talked to him over the phone. He stated that pt had no intermittent vision loss. Pt had vision loss and seeing only HW at right eye. He did right eye fluorescein Angiography and  confirmed right CRAO. He sent pt to hospital for further evaluation.     OBJECTIVE Temp:  [97.7 F (36.5 C)-98.2 F (36.8 C)] 97.7 F (36.5 C) (09/28 0412) Pulse Rate:  [57-60] 58 (09/28 0412) Cardiac Rhythm: Heart block (09/28 0703) Resp:  [13-23] 20 (09/28 0412) BP: (134-213)/(58-81) 170/67 (09/28 0412) SpO2:  [95 %-100 %] 98 % (09/28 0412) Weight:  [70.2 kg (154 lb 11.2 oz)] 70.2 kg (154 lb 11.2 oz) (09/28 0416)  CBC:   Recent Labs Lab 08/19/17 1030 08/22/17 1707 08/23/17 0427  WBC 5.9 6.0 5.8  NEUTROABS 3.9 3.8  --   HGB 14.5 13.3 13.9  HCT 42.6 39.4 40.9  MCV 94.2 93.1 91.7  PLT 96* PLATELET CLUMPS NOTED ON SMEAR, UNABLE TO ESTIMATE 86*    Basic Metabolic Panel:   Recent Labs Lab 08/22/17 1707 08/23/17 0427  NA 137 139  K 4.7 3.9  CL 106 108  CO2 23 23  GLUCOSE 89 94  BUN 25* 18  CREATININE 1.53* 1.16  CALCIUM 8.5* 8.5*    Lipid Panel:     Component Value Date/Time   CHOL 179 08/23/2017 0427   TRIG 132 08/23/2017 0427   HDL 36 (L) 08/23/2017 0427   CHOLHDL 5.0 08/23/2017 0427   VLDL 26 08/23/2017 0427   LDLCALC 117 (H) 08/23/2017 0427   HgbA1c:  Lab Results  Component Value Date   HGBA1C 4.7 (L) 08/23/2017   Urine Drug Screen: No results found for: LABOPIA, [place], [place], AMPHETMU,  THCU, LABBARB  Alcohol Level No results found for: [place] I have personally reviewed the radiological images below and agree with the radiology interpretations.  Ct Head Wo Contrast 08/22/2017 IMPRESSION: No acute intracranial findings. Mild chronic ischemic microvascular disease.   Mr Jeffrey Simmons And Wo Contrast Mr Jeffrey Simmons EB Contrast Mr Angiogram Neck W Or Wo Contrast 08/23/2017 IMPRESSION: MRI head: 1. Punctate acute/early subacute small vessel infarct within left parietal subcortical white matter. No acute hemorrhage or mass effect. 2. Moderate chronic microvascular ischemic changes and moderate parenchymal volume loss of the brain. Scattered  foci of chronic microhemorrhage in a nonspecific distribution, probably related to hypertension, less likely amyloid vasculopathy. 3. No abnormal enhancement of the brain. MRA head: 1. Patent circle of Willis. No large vessel occlusion, aneurysm, or significant stenosis is identified. 2. Foci of mild stenosis and bilateral M1 and mild-to-moderate stenosis of bilateral P2/P3 segments. MRA neck: 1. Patent carotid and vertebral arteries. No hemodynamically significant stenosis by NASCET criteria, dissection, occlusion, or aneurysm. 2. Mild atherosclerosis of right carotid bifurcation with mild less than 50% distal common carotid artery stenosis.  Carotid US 08/20/2017 Minimal to moderate amount of bilateral atherosclerotic plaque, right greater than left, not resulting in a hemodynamically significant stenosis within either internal carotid artery.  TTE 08/23/2017 Study Conclusions - Left ventricle: The cavity size was normal. Systolic function was normal. The estimated ejection fraction was in the range of 60% to 65%. Wall motion was normal; there were no regional wall motion abnormalities. Doppler parameters are consistent with abnormal left ventricular relaxation (grade 1 diastolic dysfunction). - Aortic valve: There was mild to moderate regurgitation. - Mitral valve: There was mild regurgitation. - Left atrium: The atrium was moderately to severely dilated. - Right atrium: The atrium was moderately dilated.   PHYSICAL EXAM  Temp:  [97.7 F (36.5 C)] 97.7 F (36.5 C) (09/28 1426) Pulse Rate:  [57-61] 61 (09/28 1426) Resp:  [13-23] 20 (09/28 1426) BP: (134-212)/(62-81) 181/62 (09/28 1426) SpO2:  [95 %-100 %] 100 % (09/28 1426) Weight:  [154 lb 11.2 oz (70.2 kg)] 154 lb 11.2 oz (70.2 kg) (09/28 0416)  General - Well nourished, well developed, in no apparent distress.  Ophthalmologic - Fundi not visualized due to noncooperation.  Cardiovascular - Regular rate and rhythm.  Mental Status -   Awake alert, orientated to person, but not to time or place. Limited speech output, hard of hearing, answer questions in short words and irrelevant. No dysarthria. Able to name 2/4 and able to repeat.   Cranial Nerves II - XII - II - Visual field intact OS, right eye nasal half visual field can see HW, temporal half able to see FC. III, IV, VI - Extraocular movements intact. V - Facial sensation intact bilaterally. VII - Facial movement intact bilaterally. VIII - Hard of hearing & vestibular intact bilaterally. X - Palate elevates symmetrically. XI - Chin turning & shoulder shrug intact bilaterally. XII - Tongue protrusion intact.  Motor Strength - The patient's strength was normal in all extremities and pronator drift was absent.  Bulk was normal and fasciculations were absent.   Motor Tone - Muscle tone was assessed at the neck and appendages and was normal.  Reflexes - The patient's reflexes were 1+ in all extremities and he had no pathological reflexes.  Sensory - Light touch, temperature/pinprick were assessed and were symmetrical.    Coordination - not cooperative.  Tremor was absent.  Gait and Station - not tested.  ASSESSMENT/PLAN Jeffrey Simmons is a 83 y.o. male with history of hypertension, lymphoma, thrombocytopenia, chronic kidney disease dementia who presents to the emergency room for right eye vision loss that occurred on Saturday. He did not receive IV t-PA due to presenting with minimal symptoms/arriving outside of the tPA treatment window.   Stroke: Punctate acute/early subacute infarct within left parietal cortical/subcortical region. Taken together the right CRAO, concerning for cardioembolic source.   Resultant  Right eye decreased vision  CT head: no acute stroke  MRI head: Punctate acute/early subacute infarct within left parietal cortical/subcortical white matter.  Multifocal punctate microhemorrhages, likely secondary to chronic hypertension, or possible  amyloid angiopathy  MRA head and neck: No LVO or high-grade stenosis  Carotid Doppler: B ICA 1-39% stenosis, VAs antegrade  2D Echo: EF 60-65%. No source of embolus  Recommend 30 day cardiac event monitoring to rule out afib as outpt  LDL 117  HgbA1c 4.7  Lovenox 40 mg sq daily for VTE prophylaxis Diet Heart Room service appropriate? Yes; Fluid consistency: Thin  aspirin 81 mg daily prior to admission, now on aspirin 325 mg daily. Continue ASA '325mg'$  on discharge.  Patient counseled to be compliant with his antithrombotic medications  Ongoing aggressive stroke risk factor management  Therapy recommendations: Home health PT/OT  Disposition: pending  Right eye retinopathy  ESR and CRP normal, unlikely GCA.   Following with Dr. Posey Pronto for retinal injection monthly  Dr. Posey Pronto can be reached at [phone] (office) [phone] (cell)  Hypertension  On the high side  BP goal normotensive Avoid hypertension due to the presence of microhemorrhages on MRI  Hyperlipidemia  Home meds: none  LDL 117, goal < 70  Add atorvastatin 20 mg PO daily  Continue statin at discharge  Other Stroke Risk Factors  Advanced age  ETOH use, advised to drink no more than 2 drink(s) a day  Family hx stroke (father)  Other Active Problems  Dementia   Lymphoma under chemo  Hospital day # 1  Neurology will sign off. Please call with questions. Pt will follow up with Cecille Rubin, NP, at [facility] in about 6 weeks. Thanks for the consult.  Rosalin Hawking, MD PhD Stroke Neurology 08/23/2017 8:34 PM   To contact Stroke Continuity provider, please refer to [email or web address] After hours, contact General Neurology

## 2017-08-23 NOTE — Consult Note (Signed)
[facility] CM Primary Care Navigator  08/23/2017  Linas Stepter 1927-10-19 [id number]    Met with patientand wife [name]) at the bedsideto identify possible discharge needs.  Wife reports that patient and her resides at [facility] in [place]. Patient not able to answer questions appropriately at times due to dementia. Wife reports that patient had right eye vision loss and was sent to the emergency room by ophthalmologist for stroke evaluation which had led to this admission.  Patient's wife endorses Dr. Lajean Manes with [facility] as the primary care provider.   Wife shared using [facility] pharmacy in [place] and [facility] service to obtain medications without difficulty.   Patient's wifeismanaginghis medicationsat home with use of "pill box" system filled weekly.  Patient's wife drives andprovidestransportation to his doctors'appointments.  Wife is the primary caregiver for patient. She mentioned that she is able to request for "companion" at the facility who can come and assist with patient's care when needed.   Anticipated discharge plan is to go back to [facility] facility with home health services per wife's report. Wife states that the facility offers rehabilitation if neededafter discharge.  Patient's wife voiced understanding to callprimary care provider's officewhen hegets back to facility and schedulea post discharge follow-upwithin a week or sooner if needed. Patient letter (with PCP's contact number) was provided as a reminder.  Explained to patient and wife regarding[facility] CM services available for health management but wifedenies any needs or concerns at this point and states being able to get help at the facility when needed. She declined EMMI calls to follow-up patient's recovery and verbalized that facility staff ("companion") are there to provide  assistance with care needs if needed per wife's report.  Patient's wifevoiced understandingto seek referral from patient's primary care provider to [facility] care management if deemed necessaryfor services in the future.  [facility] care management information provided for future needs that may arise.   For questions, please contact:  Dannielle Huh, BSN, RN- [facility] Primary Care Navigator  Telephone: [phone] [place]

## 2017-08-23 NOTE — Progress Notes (Signed)
Pt arrived to [facility] [place] from the ED around 0400 via stretcher. Pt A&Ox2 (person/place-know's he is in a hospital, but not sure which one). Pt has white/fall/DNR bracelets in place. Pt oriented to the room/rules/call light, call light within reach, however pt trying to get OOB without assistance. [name] skin is dry/flaky (generalized, but mostly lower legs/ankles/feet/heels. Pt has generalized petechia over his body, mostly on chest, back and bilateral legs. Redness present to groin/scrotum. Ecchymosis present to lateral aspect of right lower leg. Low bed in place d/t high fall risk. Q2H neuro checks and NIH-score 2. Will continue to monitor pt status.

## 2017-08-23 NOTE — Progress Notes (Signed)
PROGRESS NOTE    Karan Ramnauth  RXV:[id number] DOB: 1927-03-03 DOA: 08/22/2017 PCP: Lajean Manes, MD   Brief Narrative: Faaris Arizpe is a 83 y.o. male with history of Mantle cell lymphoma, chronic kidney disease, chronic thrombocytopenia, hypertension, dementia has been experiencing some blurred vision in the right eye over the last 1 week. Patient's symptoms started last Wednesday with blurriness of the vision on the right eye which continued for next 2 days and had gone to his PCP. At the PCPs office patient blood pressure was found to be low and his antihypertensives were stopped. Carotid Dopplers were ordered which was done 2 days ago which did not show any hemodynamically significant obstruction. Following the PCPs office visit patient patient became more worried and became completely blind. Patient had followed up with his ophthalmologist yesterday and was referred to the ER for Central Retinal Artery Occlusion and concern for stroke. Patient had his left leg giveaway 2 days ago but otherwise he is not weak or any extremities. Denies any difficulty swallowing or speaking. Was admitted and Neurology followed as he was worked up. Neurology recommended Daily ASA and Atorvastatin. PT evaluated and recommended Home Health PT. Will D/C Home in AM.   Assessment & Plan:   Principal Problem:   Acute CVA (cerebrovascular accident) ([place]) Active Problems:   Mantle cell lymphoma of intra-abdominal lymph nodes (HCC)   Thrombocytopenia (HCC)   Essential hypertension   Chronic kidney disease, stage III (moderate)   Stroke (cerebrum) (HCC)  Right Central Retinal Artery Occulusion with concern for CVA  -Sent over by Opthalmology -Neurology Consulted and appreciate Recc's -CT Head showed No acute intracranial findings and Mild chronic ischemic microvascular disease. -MRI Brain showed Punctate acute/early subacute small vessel infarct within left parietal subcortical white matter. No acute hemorrhage or  mass effect. Moderate chronic microvascular ischemic changes and moderate parenchymal volume loss of the brain. Scattered foci of chronic microhemorrhage in a nonspecific distribution, probably related to hypertension, less likely amyloid vasculopathy. No abnormal enhancement of the brain.   -MRA Brain showed Patent circle of Willis. No large vessel occlusion, aneurysm, or significant stenosis is identified. Foci of mild stenosis and bilateral M1 and mild-to-moderate stenosis of bilateral P2/P3 segments -MRA Neck showed Patent carotid and vertebral arteries. No hemodynamically significant stenosis by NASCET criteria, dissection, occlusion, or aneurysm. Mild atherosclerosis of right carotid bifurcation with mild less than 50% distal common carotid artery stenosis -Recent U/S Carotid's showed Minimal to moderate amount of bilateral atherosclerotic plaque, right greater than left, not resulting in a hemodynamically significant stenosis within either internal carotid artery -ECHOCARDIOGRAM ordered and showed EF of 60-65% with Grade 1 DD  -Lipid Panel showed Cholesterol of 179, HDL of 36, LDL of 117, TG of 132, and VLDL of 26 -HbA1c was 4.7 -Neuro recommending Full Dose ASA 325 mg po Daily and Atorvastatin 20 mg po qHS -PT/OT recommending Home Health PT/OT -Checked ESR and CRP to r/o Temporal Arteritis as it was unilateral vision loss and ESR was 1 and CRP was <0.8 -Will D/C in AM to Home with Home Health  Essential Hypertension  -Patient recently had fluctuating blood pressures and antihypertensives were stopped. -Allow for permissive HTN   Mantle Cell Lymphoma -C/w Ibrutinib 280 mg po Daily   Chronic Kidney disease stage III  -Creatinine appears to be at baseline. -BUN/Cr went from 25/1.53 -> 18/1.16 and repeat Cr was 1.28 -Follow up as an outpatient   Dementia -C/w Memantine 28 mg po Daily  Hyperlipidemia  -Lipid Panel  showed Cholesterol of 179, HDL of 36, LDL of 117, TG of 132, and  VLDL of 26 -Started Patient on Atorvastatin 20 mg po qHS  Thrombocytopenia -Chronic -Platelet Count was 86 -Continue to Monitor and repeat CBC in AM   DVT prophylaxis: Enoxaparin 40 mg sq daily Code Status: FULL CODE Family Communication: Discussed with Wife at Bedside Disposition Plan: Home with [facility] PT/OT in AM  Consultants:   Neurology   Procedures:  ECHOCARDIOGRAM Study Conclusions  - Left ventricle: The cavity size was normal. Systolic function was   normal. The estimated ejection fraction was in the range of 60%   to 65%. Wall motion was normal; there were no regional wall   motion abnormalities. Doppler parameters are consistent with   abnormal left ventricular relaxation (grade 1 diastolic   dysfunction). - Aortic valve: There was mild to moderate regurgitation. - Mitral valve: There was mild regurgitation. - Left atrium: The atrium was moderately to severely dilated. - Right atrium: The atrium was moderately dilated.   Antimicrobials:  Anti-infectives    None     Subjective: Seen and still had some trouble seeing out of his Right eye especially medially but with his dementia patient would cheat during visual field testing. No CP or SOB. No other concerns or complaints at this time.   Objective: Vitals:   08/23/17 0300 08/23/17 0412 08/23/17 0416 08/23/17 1426  BP: (!) 160/76 (!) 170/67  (!) 181/62  Pulse:  (!) 58  61  Resp: '13 20  20  '$ Temp:  97.7 F (36.5 C)  97.7 F (36.5 C)  TempSrc:  Oral  Oral  SpO2:  98%  100%  Weight:   70.2 kg (154 lb 11.2 oz)   Height:   '6\' 1"'$  (1.854 m)     Intake/Output Summary (Last 24 hours) at 08/23/17 1858 Last data filed at 08/23/17 6283  Gross per 24 hour  Intake             1672 ml  Output             1000 ml  Net              672 ml   Filed Weights   08/23/17 0416  Weight: 70.2 kg (154 lb 11.2 oz)   Examination: Physical Exam:  Constitutional: WN/WD pleasantly demented Caucasian male in NAD and  appears calm and comfortable Eyes: Lids and conjunctivae normal, sclerae anicteric; Has difficulty with visual field testing ENMT: External Ears, Nose appear normal. Grossly normal hearing. Mucous membranes are moist. Neck: Appears normal, supple, no cervical masses, normal ROM, no appreciable thyromegaly, no JVD Respiratory: Clear to auscultation bilaterally, no wheezing, rales, rhonchi or crackles. Normal respiratory effort and patient is not tachypenic. No accessory muscle use.  Cardiovascular: RRR, no murmurs / rubs / gallops. S1 and S2 auscultated. No extremity edema.  Abdomen: Soft, non-tender, non-distended. No masses palpated. No appreciable hepatosplenomegaly. Bowel sounds positive.  GU: Deferred. Musculoskeletal: No clubbing / cyanosis of digits/nails. No joint deformity upper and lower extremities. Good ROM, no contractures.   Skin: No rashes, lesions, ulcers on a limited skin eval. No induration; Warm and dry.  Neurologic: Difficulty seeing out of Right eye with no other focal deficits. Sensation intact in all 4 Extremities.Romberg sign cerebellar reflexes not assessed.  Psychiatric: Impaired judgment and insight. Alert and awake. Pleasantly demented.   Data Reviewed: I have personally reviewed following labs and imaging studies  CBC:  Recent Labs Lab 08/19/17 1030  08/22/17 1707 08/23/17 0427  WBC 5.9 6.0 5.8  NEUTROABS 3.9 3.8  --   HGB 14.5 13.3 13.9  HCT 42.6 39.4 40.9  MCV 94.2 93.1 91.7  PLT 96* PLATELET CLUMPS NOTED ON SMEAR, UNABLE TO ESTIMATE 86*   Basic Metabolic Panel:  Recent Labs Lab 08/19/17 1030 08/22/17 1707 08/23/17 0427 08/23/17 1520  NA 140 137 139  --   K 4.6 4.7 3.9  --   CL  --  106 108  --   CO2 '27 23 23  '$ --   GLUCOSE 78 89 94  --   BUN 20.9 25* 18  --   CREATININE 1.4* 1.53* 1.16 1.28*  CALCIUM 9.1 8.5* 8.5*  --    GFR: Estimated Creatinine Clearance: 39.6 mL/min (A) (by C-G formula based on SCr of 1.28 mg/dL (H)). Liver Function  Tests:  Recent Labs Lab 08/19/17 1030 08/22/17 1707 08/23/17 0427  AST '16 22 19  '$ ALT 10 14* 13*  ALKPHOS 89 73 70  BILITOT 1.19 0.9 1.2  PROT 5.9* 5.2* 5.2*  ALBUMIN 3.6 3.4* 3.5   No results for input(s): LIPASE, AMYLASE in the last 168 hours. No results for input(s): AMMONIA in the last 168 hours. Coagulation Profile:  Recent Labs Lab 08/22/17 1707  INR 1.01   Cardiac Enzymes: No results for input(s): CKTOTAL, CKMB, CKMBINDEX, TROPONINI in the last 168 hours. BNP (last 3 results) No results for input(s): PROBNP in the last 8760 hours. HbA1C:  Recent Labs  08/23/17 0427  HGBA1C 4.7*   CBG:  Recent Labs Lab 08/23/17 1641  GLUCAP 119*   Lipid Profile:  Recent Labs  08/23/17 0427  CHOL 179  HDL 36*  LDLCALC 117*  TRIG 132  CHOLHDL 5.0   Thyroid Function Tests: No results for input(s): TSH, T4TOTAL, FREET4, T3FREE, THYROIDAB in the last 72 hours. Anemia Panel: No results for input(s): VITAMINB12, FOLATE, FERRITIN, TIBC, IRON, RETICCTPCT in the last 72 hours. Sepsis Labs: No results for input(s): PROCALCITON, LATICACIDVEN in the last 168 hours.  No results found for this or any previous visit (from the past 240 hour(s)).   Radiology Studies: Ct Head Wo Contrast  Result Date: 08/22/2017 CLINICAL DATA:  Fuzziness right eye 1 week.  Dementia. EXAM: CT HEAD WITHOUT CONTRAST TECHNIQUE: Contiguous axial images were obtained from the base of the skull through the vertex without intravenous contrast. COMPARISON:  03/18/2015 FINDINGS: Brain: Ventricles, cisterns and other CSF spaces are within normal. There is chronic ischemic microvascular disease present. No evidence of mass, mass effect, shift of midline structures or acute hemorrhage. No acute infarction. Vascular: No hyperdense vessel or unexpected calcification. Skull: Normal. Negative for fracture or focal lesion. Sinuses/Orbits: The orbits are normal. Small polyp versus mucous retention cyst over the floor  the right maxillary sinus. Other: None. IMPRESSION: No acute intracranial findings. Mild chronic ischemic microvascular disease. Electronically Signed   By: Marin Olp M.D.   On: 08/22/2017 20:24   Mr Jodene Nam Head Wo Contrast  Result Date: 08/23/2017 CLINICAL DATA:  83 y/o M; vision changes in the right eye. History of mantle cell lymphoma and thrombocytopenia. EXAM: MRI HEAD WITHOUT AND WITH CONTRAST MRA HEAD WITHOUT CONTRAST MRA NECK WITHOUT AND WITH CONTRAST TECHNIQUE: Multiplanar, multiecho pulse sequences of the brain and surrounding structures were obtained without and with intravenous contrast. Angiographic images of the head were obtained using MRA technique without contrast. Angiographic images of the neck were obtained using MRA technique without and with contrast. CONTRAST:  83m MULTIHANCE  GADOBENATE DIMEGLUMINE 529 MG/ML IV SOLN COMPARISON:  The 08/22/2017 CT of the head. FINDINGS: MRI HEAD FINDINGS Brain: Left parietal lobe punctate subcortical focus of reduced diffusion compatible with small vessel acute/early subacute infarction (series 4, image 28) no additional diffusion signal abnormality. Patchy nonspecific foci of T2 FLAIR hyperintense signal abnormality in subcortical and periventricular white matter are compatible with moderate chronic microvascular ischemic changes for age. Moderate brain parenchymal volume loss. No hydrocephalus, focal mass effect, or extra-axial collection. No effacement of basilar cisterns. There several scattered punctate foci of susceptibility hypointensity best appreciated in left posterior temporal lobe, right periatrial white matter, and the right frontal centrum semiovale which are compatible with hemosiderin deposition from prior microhemorrhage. After administration of intravenous contrast there is no abnormal enhancement of the brain. Vascular: As below. Skull and upper cervical spine: Normal marrow signal. Sinuses/Orbits: Mild diffuse ethmoid sinus mucosal  thickening. No significant abnormal signal of mastoid air cells. Bilateral intra-ocular lens replacement. Other: None. MRA HEAD FINDINGS Internal carotid arteries: Patent. 3 mm inferiorly directed outpouching of left paraclinoid ICA (series 5, image 120) which may represent a small aneurysm or infundibular origin of diminutive vessel. Anterior cerebral arteries:  Patent. Middle cerebral arteries: Patent. Mild right mid M1 and left distal M1 stenosis. Anterior communicating artery: Patent. Posterior communicating arteries: Fetal right PCA. Probable diminutive left posterior communicating artery. Posterior cerebral arteries: Patent. Fetal right PCA. Segments of mild-to-moderate stenosis and bilateral P2 and P3 distributions. Basilar artery:  Patent. Vertebral arteries:  Patent. No additional evidence of high-grade stenosis, large vessel occlusion, or aneurysm unless noted above. MRA NECK FINDINGS Aortic arch: Patent. Right common carotid artery: Patent. Plaque at the right carotid bifurcation with mild less than 50% distal common carotid artery stenosis just proximal to the bifurcation. Right internal carotid artery: Patent. Right vertebral artery: Patent. Left common carotid artery: Patent. Left Internal carotid artery: Patent. Left Vertebral artery: Patent. There is no evidence of hemodynamically significant stenosis by NASCET criteria, occlusion, or aneurysm. IMPRESSION: MRI head: 1. Punctate acute/early subacute small vessel infarct within left parietal subcortical white matter. No acute hemorrhage or mass effect. 2. Moderate chronic microvascular ischemic changes and moderate parenchymal volume loss of the brain. Scattered foci of chronic microhemorrhage in a nonspecific distribution, probably related to hypertension, less likely amyloid vasculopathy. 3. No abnormal enhancement of the brain. MRA head: 1. Patent circle of Willis. No large vessel occlusion, aneurysm, or significant stenosis is identified. 2. Foci  of mild stenosis and bilateral M1 and mild-to-moderate stenosis of bilateral P2/P3 segments. MRA neck: 1. Patent carotid and vertebral arteries. No hemodynamically significant stenosis by NASCET criteria, dissection, occlusion, or aneurysm. 2. Mild atherosclerosis of right carotid bifurcation with mild less than 50% distal common carotid artery stenosis. Electronically Signed   By: Mitzi Hansen M.D.   On: 08/23/2017 05:19   Mr Angiogram Neck W Or Wo Contrast  Result Date: 08/23/2017 CLINICAL DATA:  83 y/o M; vision changes in the right eye. History of mantle cell lymphoma and thrombocytopenia. EXAM: MRI HEAD WITHOUT AND WITH CONTRAST MRA HEAD WITHOUT CONTRAST MRA NECK WITHOUT AND WITH CONTRAST TECHNIQUE: Multiplanar, multiecho pulse sequences of the brain and surrounding structures were obtained without and with intravenous contrast. Angiographic images of the head were obtained using MRA technique without contrast. Angiographic images of the neck were obtained using MRA technique without and with contrast. CONTRAST:  49mL MULTIHANCE GADOBENATE DIMEGLUMINE 529 MG/ML IV SOLN COMPARISON:  The 08/22/2017 CT of the head. FINDINGS: MRI HEAD FINDINGS Brain:  Left parietal lobe punctate subcortical focus of reduced diffusion compatible with small vessel acute/early subacute infarction (series 4, image 28) no additional diffusion signal abnormality. Patchy nonspecific foci of T2 FLAIR hyperintense signal abnormality in subcortical and periventricular white matter are compatible with moderate chronic microvascular ischemic changes for age. Moderate brain parenchymal volume loss. No hydrocephalus, focal mass effect, or extra-axial collection. No effacement of basilar cisterns. There several scattered punctate foci of susceptibility hypointensity best appreciated in left posterior temporal lobe, right periatrial white matter, and the right frontal centrum semiovale which are compatible with hemosiderin  deposition from prior microhemorrhage. After administration of intravenous contrast there is no abnormal enhancement of the brain. Vascular: As below. Skull and upper cervical spine: Normal marrow signal. Sinuses/Orbits: Mild diffuse ethmoid sinus mucosal thickening. No significant abnormal signal of mastoid air cells. Bilateral intra-ocular lens replacement. Other: None. MRA HEAD FINDINGS Internal carotid arteries: Patent. 3 mm inferiorly directed outpouching of left paraclinoid ICA (series 5, image 120) which may represent a small aneurysm or infundibular origin of diminutive vessel. Anterior cerebral arteries:  Patent. Middle cerebral arteries: Patent. Mild right mid M1 and left distal M1 stenosis. Anterior communicating artery: Patent. Posterior communicating arteries: Fetal right PCA. Probable diminutive left posterior communicating artery. Posterior cerebral arteries: Patent. Fetal right PCA. Segments of mild-to-moderate stenosis and bilateral P2 and P3 distributions. Basilar artery:  Patent. Vertebral arteries:  Patent. No additional evidence of high-grade stenosis, large vessel occlusion, or aneurysm unless noted above. MRA NECK FINDINGS Aortic arch: Patent. Right common carotid artery: Patent. Plaque at the right carotid bifurcation with mild less than 50% distal common carotid artery stenosis just proximal to the bifurcation. Right internal carotid artery: Patent. Right vertebral artery: Patent. Left common carotid artery: Patent. Left Internal carotid artery: Patent. Left Vertebral artery: Patent. There is no evidence of hemodynamically significant stenosis by NASCET criteria, occlusion, or aneurysm. IMPRESSION: MRI head: 1. Punctate acute/early subacute small vessel infarct within left parietal subcortical white matter. No acute hemorrhage or mass effect. 2. Moderate chronic microvascular ischemic changes and moderate parenchymal volume loss of the brain. Scattered foci of chronic microhemorrhage in a  nonspecific distribution, probably related to hypertension, less likely amyloid vasculopathy. 3. No abnormal enhancement of the brain. MRA head: 1. Patent circle of Willis. No large vessel occlusion, aneurysm, or significant stenosis is identified. 2. Foci of mild stenosis and bilateral M1 and mild-to-moderate stenosis of bilateral P2/P3 segments. MRA neck: 1. Patent carotid and vertebral arteries. No hemodynamically significant stenosis by NASCET criteria, dissection, occlusion, or aneurysm. 2. Mild atherosclerosis of right carotid bifurcation with mild less than 50% distal common carotid artery stenosis. Electronically Signed   By: Kristine Garbe M.D.   On: 08/23/2017 05:19   Mr Jeri Cos And Wo Contrast  Result Date: 08/23/2017 CLINICAL DATA:  83 y/o M; vision changes in the right eye. History of mantle cell lymphoma and thrombocytopenia. EXAM: MRI HEAD WITHOUT AND WITH CONTRAST MRA HEAD WITHOUT CONTRAST MRA NECK WITHOUT AND WITH CONTRAST TECHNIQUE: Multiplanar, multiecho pulse sequences of the brain and surrounding structures were obtained without and with intravenous contrast. Angiographic images of the head were obtained using MRA technique without contrast. Angiographic images of the neck were obtained using MRA technique without and with contrast. CONTRAST:  54m MULTIHANCE GADOBENATE DIMEGLUMINE 529 MG/ML IV SOLN COMPARISON:  The 08/22/2017 CT of the head. FINDINGS: MRI HEAD FINDINGS Brain: Left parietal lobe punctate subcortical focus of reduced diffusion compatible with small vessel acute/early subacute infarction (series 4, image 28)  no additional diffusion signal abnormality. Patchy nonspecific foci of T2 FLAIR hyperintense signal abnormality in subcortical and periventricular white matter are compatible with moderate chronic microvascular ischemic changes for age. Moderate brain parenchymal volume loss. No hydrocephalus, focal mass effect, or extra-axial collection. No effacement of basilar  cisterns. There several scattered punctate foci of susceptibility hypointensity best appreciated in left posterior temporal lobe, right periatrial white matter, and the right frontal centrum semiovale which are compatible with hemosiderin deposition from prior microhemorrhage. After administration of intravenous contrast there is no abnormal enhancement of the brain. Vascular: As below. Skull and upper cervical spine: Normal marrow signal. Sinuses/Orbits: Mild diffuse ethmoid sinus mucosal thickening. No significant abnormal signal of mastoid air cells. Bilateral intra-ocular lens replacement. Other: None. MRA HEAD FINDINGS Internal carotid arteries: Patent. 3 mm inferiorly directed outpouching of left paraclinoid ICA (series 5, image 120) which may represent a small aneurysm or infundibular origin of diminutive vessel. Anterior cerebral arteries:  Patent. Middle cerebral arteries: Patent. Mild right mid M1 and left distal M1 stenosis. Anterior communicating artery: Patent. Posterior communicating arteries: Fetal right PCA. Probable diminutive left posterior communicating artery. Posterior cerebral arteries: Patent. Fetal right PCA. Segments of mild-to-moderate stenosis and bilateral P2 and P3 distributions. Basilar artery:  Patent. Vertebral arteries:  Patent. No additional evidence of high-grade stenosis, large vessel occlusion, or aneurysm unless noted above. MRA NECK FINDINGS Aortic arch: Patent. Right common carotid artery: Patent. Plaque at the right carotid bifurcation with mild less than 50% distal common carotid artery stenosis just proximal to the bifurcation. Right internal carotid artery: Patent. Right vertebral artery: Patent. Left common carotid artery: Patent. Left Internal carotid artery: Patent. Left Vertebral artery: Patent. There is no evidence of hemodynamically significant stenosis by NASCET criteria, occlusion, or aneurysm. IMPRESSION: MRI head: 1. Punctate acute/early subacute small vessel  infarct within left parietal subcortical white matter. No acute hemorrhage or mass effect. 2. Moderate chronic microvascular ischemic changes and moderate parenchymal volume loss of the brain. Scattered foci of chronic microhemorrhage in a nonspecific distribution, probably related to hypertension, less likely amyloid vasculopathy. 3. No abnormal enhancement of the brain. MRA head: 1. Patent circle of Willis. No large vessel occlusion, aneurysm, or significant stenosis is identified. 2. Foci of mild stenosis and bilateral M1 and mild-to-moderate stenosis of bilateral P2/P3 segments. MRA neck: 1. Patent carotid and vertebral arteries. No hemodynamically significant stenosis by NASCET criteria, dissection, occlusion, or aneurysm. 2. Mild atherosclerosis of right carotid bifurcation with mild less than 50% distal common carotid artery stenosis. Electronically Signed   By: Kristine Garbe M.D.   On: 08/23/2017 05:19   Scheduled Meds: . aspirin  300 mg Rectal Daily   Or  . aspirin  325 mg Oral Daily  . atorvastatin  20 mg Oral q1800  . enoxaparin (LOVENOX) injection  40 mg Subcutaneous Daily  . famotidine  20 mg Oral Daily  . ibrutinib  280 mg Oral Daily  . memantine  28 mg Oral Daily  . multivitamin  1 tablet Oral Daily   Continuous Infusions: . sodium chloride 100 mL/hr at 08/23/17 1223     LOS: 1 day   Kerney Elbe, DO [facility] Pager [phone]  If 7PM-7AM, please contact night-coverage [email or web address] Password [facility] 08/23/2017, 6:58 PM

## 2017-08-23 NOTE — Evaluation (Signed)
Occupational Therapy Evaluation and Discharge Patient Details Name: Jeffrey Simmons MRN: [id number] DOB: 1927/07/18 Today's Date: 08/23/2017    History of Present Illness Jeffrey Simmons is a 83 y.o. male with history of mantle cell lymphoma, CKD, chronic thrombocytopenia, HTN, dementia has been experiencing some intermittent blurred vision in the right eye with one episode of complete vision loss over the last 1 week. MRI: Punctate acute/early subacute small vessel infarct within left parietal subcortical white matter   Clinical Impression   This 83 yo male admitted and found to have above presents to acute OT with all education completed with pt and wife from an acute care standpoint, recommend HHOT (to look at vision functionally in the home) and 24 hour S/min guard A when up on his feet (wife aware). No further OT needs, we will sign off.     Follow Up Recommendations  Home health OT;Supervision/Assistance - 24 hour    Equipment Recommendations  None recommended by OT       Precautions / Restrictions Precautions Precautions: Fall Restrictions Weight Bearing Restrictions: No      Mobility Bed Mobility               General bed mobility comments: S  Transfers Overall transfer level: Needs assistance Equipment used: None Transfers: Sit to/from Stand Sit to Stand: Min guard         General transfer comment: Ambulation min guard A in room; pt holding onto sink, foot rail of bed    Balance Overall balance assessment: Needs assistance Sitting-balance support: No upper extremity supported;Feet supported Sitting balance-Leahy Scale: Good     Standing balance support: No upper extremity supported Standing balance-Leahy Scale: Fair                             ADL either performed or assessed with clinical judgement   ADL                                         General ADL Comments: min guard A when up on his feet due to he is having  intermittent vision loss in right eye which puts him at an increased fall risk--I have educated his wife on this     Vision Baseline Vision/History: Wears glasses Wears Glasses: At all times Patient Visual Report: Blurring of vision Vision Assessment?: Yes Eye Alignment: Within Functional Limits Ocular Range of Motion: Within Functional Limits Alignment/Gaze Preference: Within Defined Limits Tracking/Visual Pursuits:  (see additional comments) Visual Fields:  (difficult to test due to following instructions for this) Additional Comments: with eyes together he can track in all quads without difficulty, with left eye covered he has difficulty tracking medially with right eye            Pertinent Vitals/Pain Pain Assessment: No/denies pain     Hand Dominance Right   Extremity/Trunk Assessment Upper Extremity Assessment Upper Extremity Assessment: Overall WFL for tasks assessed   Lower Extremity Assessment Lower Extremity Assessment: Defer to PT evaluation       Communication Communication Communication: No difficulties   Cognition Arousal/Alertness: Awake/alert Behavior During Therapy: WFL for tasks assessed/performed Overall Cognitive Status: History of cognitive impairments - at baseline  Home Living Family/patient expects to be discharged to:: Private residence Living Arrangements: Spouse/significant other Available Help at Discharge: Family;Available 24 hours/day Type of Home: Independent living facility Home Access: Level entry     Home Layout: One level     Bathroom Shower/Tub: Walk-in shower;Curtain   Bathroom Toilet: Handicapped height     Home Equipment: [place] - single point;Walker - 2 wheels;Hand held shower head;Grab bars - toilet;Grab bars - tub/shower          Prior Functioning/Environment          Comments: S due to dementia; he does all of his bathing/dressing/toileting; does  not use AD-takes small steps per wife and holds onto furniture/walls if near by        OT Problem List: Impaired balance (sitting and/or standing);Impaired vision/perception         OT Goals(Current goals can be found in the care plan section) Acute Rehab OT Goals Patient Stated Goal: wife is hopeful for home today/tomorrow  OT Frequency:                AM-PAC PT "6 Clicks" Daily Activity     Outcome Measure Help from another person eating meals?: A Little Help from another person taking care of personal grooming?: A Little Help from another person toileting, which includes using toliet, bedpan, or urinal?: A Little Help from another person bathing (including washing, rinsing, drying)?: A Little Help from another person to put on and taking off regular upper body clothing?: A Little Help from another person to put on and taking off regular lower body clothing?: A Little 6 Click Score: 18   End of Session Equipment Utilized During Treatment: Gait belt  Activity Tolerance: Patient tolerated treatment well Patient left: in chair;with call bell/phone within reach;with chair alarm set;with family/visitor present  OT Visit Diagnosis: Unsteadiness on feet (R26.81);Other (comment) (low vision right eye)                Time: 7510-2585 OT Time Calculation (min): 48 min Charges:  OT General Charges $OT Visit: 1 Visit OT Evaluation $OT Eval Moderate Complexity: 1 Mod OT Treatments $Self Care/Home Management : 23-37 mins [place], OTR/L [phone] 08/23/2017

## 2017-08-24 DIAGNOSIS — G301 Alzheimer's disease with late onset: Secondary | ICD-10-CM

## 2017-08-24 DIAGNOSIS — F0281 Dementia in other diseases classified elsewhere with behavioral disturbance: Secondary | ICD-10-CM

## 2017-08-24 MED ORDER — ASPIRIN 325 MG PO TABS: 325.0000 mg | ORAL_TABLET | Freq: Every day | ORAL | 0 refills | Status: DC

## 2017-08-24 MED ORDER — HYDRALAZINE HCL 20 MG/ML IJ SOLN: 10.0000 mg | INTRAMUSCULAR | Status: DC | PRN

## 2017-08-24 MED ORDER — AMLODIPINE BESYLATE 2.5 MG PO TABS
2.5000 mg | ORAL_TABLET | Freq: Every day | ORAL | Status: DC
  Filled 2017-08-24: qty 1

## 2017-08-24 MED ORDER — STROKE: EARLY STAGES OF RECOVERY BOOK: 1.0000 | Freq: Once | 0 refills | Status: AC

## 2017-08-24 MED ORDER — ATORVASTATIN CALCIUM 20 MG PO TABS: 20.0000 mg | ORAL_TABLET | Freq: Every day | ORAL | 0 refills | Status: DC

## 2017-08-24 MED ORDER — AMLODIPINE BESYLATE 2.5 MG PO TABS: 2.5000 mg | ORAL_TABLET | Freq: Every day | ORAL | 0 refills | Status: AC

## 2017-08-24 MED ORDER — STROKE: EARLY STAGES OF RECOVERY BOOK
Freq: Once | Status: AC
  Administered 2017-08-24: 14:00:00
  Filled 2017-08-24 (×2): qty 1

## 2017-08-24 MED ORDER — HALOPERIDOL LACTATE 5 MG/ML IJ SOLN
2.0000 mg | Freq: Once | INTRAMUSCULAR | Status: AC
  Administered 2017-08-24: 2 mg via INTRAVENOUS
  Filled 2017-08-24: qty 1

## 2017-08-24 NOTE — Progress Notes (Signed)
Pt was reported to having intermittent agitation. Upon assessment, pt was calm after receiving a bath. NP on call was notified for a PRN Ativan as requested by family to have on file if patient was later reassessed to be agitated. Will continue to monitor.

## 2017-08-24 NOTE — Evaluation (Signed)
Speech Language Pathology Evaluation Patient Details Name: Jeffrey Simmons MRN: [id number] DOB: 1927/10/03 Today's Date: 08/24/2017 Time: 7858-8502 SLP Time Calculation (min) (ACUTE ONLY): 22 min  Problem List:  Patient Active Problem List   Diagnosis Date Noted  . CRAO (central retinal artery occlusion), right   . Late onset Alzheimer's disease with behavioral disturbance   . Acute CVA (cerebrovascular accident) ([place]) 08/22/2017  . Stroke (cerebrum) ([place]) 08/22/2017  . Weight loss 08/20/2017  . Chronic kidney disease, stage III (moderate) 02/19/2017  . Essential hypertension 06/09/2015  . Leukopenia due to antineoplastic chemotherapy ([place]) 03/31/2015  . CAP (community acquired pneumonia) 03/21/2015  . Fall at home 03/18/2015  . Sepsis ([place]) 03/18/2015  . Traumatic hematoma of head 03/18/2015  . Elevated troponin 03/18/2015  . Rhabdomyolysis 03/18/2015  . Seborrheic keratoses, inflamed 02/03/2015  . Hematuria 08/23/2014  . Thrombocytopenia ([place]) 05/04/2014  . Bruising 05/04/2014  . Moderate dementia without behavioral disturbance 01/05/2014  . Mantle cell lymphoma of intra-abdominal lymph nodes ([place]) 03/04/2012   Past Medical History:  Past Medical History:  Diagnosis Date  . Dementia   . GERD (gastroesophageal reflux disease)   . Hematuria 08/23/2014  . Hypertension   . Mantle cell lymphoma of intra-abdominal lymph nodes ([facility]) 03/04/2012   Dx 01/07/09 dominant mass R pelvis 4.8 x 2.3 cm  bx R inguinal node 02/01/09 Rx R-CVP x 4 then 4x CNovantrone VP   Past Surgical History:  Past Surgical History:  Procedure Laterality Date  . REPLACEMENT TOTAL KNEE  2008   HPI:  Pt is an 83 y.o. male with PMH of mantle cell lymphoma, chronic kidney disease, chronic thrombocytopenia, hypertension, dementia has been experiencing some blurred vision in the right eye over the last 1 week. Was referred to ED for concern of stroke. MRI showed punctate acute/early subacute small vessel infarct within  left parietal subcortical white matter. No acute hemorrhage or mass effect; scattered foci of chronic microhemorrhage. SLP eval ordered as part of stroke workup.   Assessment / Plan / Recommendation Clinical Impression  Pt with baseline deficits of severe cognitive impairments; wife feels that pt is near baseline with cognitive-linguistic skills. Feels that pt is showing more significant deficits due to being in a new environment. Pt with difficulty following 2-step commands, answering yes/ no questions, disoriented to location/ time/ situation. Answered some simple biographical questions verbally; unable to assess naming skills as pt was confused by expectation of task. Wife at bedside currently upset regarding pt's status; feels that he is leaving hospital worse than he came in. Attempted to provide support/ education on speaking in simple sentences/ using visual cues. Will sign off as pt is at or near baseline; wife may benefit from speech therapy consult at assisted living facility for support and review of compensatory strategies.     SLP Assessment  SLP Recommendation/Assessment:  (may benefit from SLP consult at facility for compensatory) SLP Visit Diagnosis: Cognitive communication deficit (R41.841)    Follow Up Recommendations  24 hour supervision/assistance;Other (comment) (compensatory strategy education for wife)    Frequency and Duration           SLP Evaluation Cognition  Overall Cognitive Status: History of cognitive impairments - at baseline Arousal/Alertness: Awake/alert Orientation Level: Oriented to person;Disoriented to place;Disoriented to time;Disoriented to situation Attention: Sustained;Selective Sustained Attention: Appears intact Selective Attention: Impaired Selective Attention Impairment: Verbal basic Memory: Impaired Memory Impairment: Decreased recall of new information;Decreased short term memory Decreased Short Term Memory: Verbal basic;Functional  basic Awareness: Impaired Safety/Judgment: Impaired  Comprehension  Auditory Comprehension Overall Auditory Comprehension: Impaired at baseline Yes/No Questions: Impaired Basic Biographical Questions: 26-50% accurate Basic Immediate Environment Questions: 25-49% accurate Commands: Impaired One Step Basic Commands: 75-100% accurate Two Step Basic Commands: 25-49% accurate Conversation: Simple Reading Comprehension Reading Status: Unable to assess (comment)    Expression Expression Primary Mode of Expression: Verbal Verbal Expression Overall Verbal Expression: Impaired at baseline Initiation: No impairment Automatic Speech: Name Level of Generative/Spontaneous Verbalization: Sentence Naming: Impairment Pragmatics: No impairment Non-Verbal Means of Communication: Not applicable   Oral / Motor  Motor Speech Overall Motor Speech: Appears within functional limits for tasks assessed   GO                    Kern Reap, MA, CCC-SLP 08/24/2017, 1:08 PM [phone]

## 2017-08-24 NOTE — Discharge Summary (Signed)
Physician Discharge Summary  Elex Mainwaring SJG:[id number] DOB: 1927/07/12 DOA: 08/22/2017  PCP: Lajean Manes, MD  Admit date: 08/22/2017 Discharge date: 08/24/2017  Admitted From: Home via Opthalmology Office Disposition:  Home with Home Health  Recommendations for Outpatient Follow-up:  1. Follow up with PCP in 1-2 weeks 2. Follow up with Neurology Cecille Rubin, FNP in 6 weeks 3. Follow up with Opthalmology Dr. Posey Pronto within 1 week 4. Follow up with Cardiology for 30-Day Event Monitoring 5. Please obtain CMP/CBC, Mag, Phos in one week  Home Health: Yes Equipment/Devices:  None  Discharge Condition: Stable CODE STATUS: DO NOT RESUSCITATE Diet recommendation: Heart Healthy Diet  Brief/Interim Summary: Jeffrey Tilleyis a 83 y.o.malewith history of Mantle cell lymphoma, chronic kidney disease, chronic thrombocytopenia, hypertension, dementia has been experiencing some blurred vision in the right eye over the last 1 week. Patient's symptoms started last Wednesday with blurriness of the vision on the right eye which continued for next 2 days and had gone to his PCP. At the PCPs office patient blood pressure was found to be low and his antihypertensives were stopped. Carotid Dopplers were ordered which was done 2 days ago which did not show any hemodynamically significant obstruction. Following the PCPs office visit patient patient became more worried and became completely blind. Patient had followed up with his Ophthalmologist and was referred to the ER for Central Retinal Artery Occlusion and concern for stroke. Patient had his left leg giveaway 2 days ago but otherwise he is not weak or any extremities. Denies any difficulty swallowing or speaking. Was admitted and Neurology followed as he was worked up and he was found to have a small CVA likely from [place]. Neurology recommended Daily ASA and Atorvastatin and outpatient 30 Day Cardiac Monitoring. PT evaluated and recommended Home  Health PT.Patient's BP was elevated and so low dose Amlodipine was added. Patient was deemed medically stable to D/C home and will need to follow up with PCP, Neurology, Ophthalmology, and Cardiology for outpatient 30-Day Cardiac Monitoring.    Discharge Diagnoses:  Principal Problem:   Acute CVA (cerebrovascular accident) ([place]) Active Problems:   Mantle cell lymphoma of intra-abdominal lymph nodes (HCC)   Thrombocytopenia (HCC)   Essential hypertension   Chronic kidney disease, stage III (moderate)   Stroke (cerebrum) (HCC)   CRAO (central retinal artery occlusion), right   Late onset Alzheimer's disease with behavioral disturbance  Right Central Retinal Artery Occulusion and Acute/Early Subacute Infarct left parietal cortical/subcortical region likely from a [place] over by Opthalmology -Neurology Consulted and appreciate Recc's -CT Head showed No acute intracranial findings and Mild chronic ischemic microvascular disease. -MRI Brain showed Punctate acute/early subacute small vessel infarct within left parietal subcortical white matter. No acute hemorrhage or mass effect. Moderate chronic microvascular ischemic changes and moderate parenchymal volume loss of the brain. Scattered foci of chronic microhemorrhage in a nonspecific distribution, probably related to hypertension, less likely amyloid vasculopathy. No abnormal enhancement of the brain.   -MRA Brain showed Patent circle of Willis. No large vessel occlusion, aneurysm, or significant stenosis is identified. Foci of mild stenosis and bilateral M1 and mild-to-moderate stenosis of bilateral P2/P3 segments -MRA Neck showed Patent carotid and vertebral arteries. No hemodynamically significant stenosis by NASCET criteria, dissection, occlusion, or aneurysm. Mild atherosclerosis of right carotid bifurcation with mild less than 50% distal common carotid artery stenosis -Recent U/S Carotid's showed Minimal to moderate  amount of bilateral atherosclerotic plaque, right greater than left, not resulting in a hemodynamically significant stenosis within  either internal carotid artery -ECHOCARDIOGRAM ordered and showed EF of 60-65% with Grade 1 DD  -Lipid Panel showed Cholesterol of 179, HDL of 36, LDL of 117, TG of 132, and VLDL of 26 -HbA1c was 4.7 -Neuro recommending Full Dose ASA 325 mg po Daily and Atorvastatin 20 mg po qHS -Will need 30 Day Cardiac Event Monitor as an out patient to r/o A Fib as per Neuro patient unlikely due to advanced Dementia -PT/OT recommending Home Health PT/OT -Checked ESR and CRP to r/o Temporal Arteritis as it was unilateral vision loss and ESR was 1 and CRP was <0.8 -Will D/C in AM to Home with Home Health -Follow up with Dr. Posey Pronto in Ophthalmology as an outpatient within 1 week  Essential Hypertension  -Patient recently had fluctuating blood pressures and antihypertensives were stopped. -Restarted Amlodipine 2.5 mg po Daily  -Per Neuro Avoid Hypertension due to the presence of Microhemorrhages on MRI -Follow up with PCP for Continued Maintenance  Mantle Cell Lymphoma -C/w Ibrutinib 280 mg po Daily   Chronic Kidney disease stage III  -Creatinine appears to be at baseline. -BUN/Cr went from 25/1.53 -> 18/1.16 and repeat Cr was 1.28 -Follow up as an outpatient with PCP  Dementia -C/w Memantine 28 mg po Daily  Hyperlipidemia  -Lipid Panel showed Cholesterol of 179, HDL of 36, LDL of 117, TG of 132, and VLDL of 26 -Started Patient on Atorvastatin 20 mg po qHS  Thrombocytopenia -Chronic -Platelet Count was 86 -Continue to Monitor and repeat CBC as an outpatient with PCP  Discharge Instructions  Discharge Instructions    Ambulatory referral to Neurology    Complete by:  As directed    Follow up with stroke clinic Cecille Rubin preferred, if not available, then consider Caesar Chestnut, [facility] or Jaynee Eagles whoever is available) at [facility] in about 6-8 weeks. Thanks.    Ambulatory referral to Neurology    Complete by:  As directed    An appointment is requested in approximately: Follow up with Cecille Rubin, FNP in 6 weeks for CVA   Call MD for:  difficulty breathing, headache or visual disturbances    Complete by:  As directed    Call MD for:  extreme fatigue    Complete by:  As directed    Call MD for:  hives    Complete by:  As directed    Call MD for:  persistant dizziness or light-headedness    Complete by:  As directed    Call MD for:  persistant nausea and vomiting    Complete by:  As directed    Call MD for:  redness, tenderness, or signs of infection (pain, swelling, redness, odor or green/yellow discharge around incision site)    Complete by:  As directed    Call MD for:  severe uncontrolled pain    Complete by:  As directed    Call MD for:  temperature >100.4    Complete by:  As directed    Diet - low sodium heart healthy    Complete by:  As directed    Discharge instructions    Complete by:  As directed    Follow up with PCP, Neurology, and Cardiology as an outpatient. Take all medications as prescribed. If symptoms change or worsen please return to the ED for Evaluation.   Increase activity slowly    Complete by:  As directed      Allergies as of 08/24/2017      Reactions   Aricept Reather Littler  Hcl] Other (See Comments)   Unknown   Zosyn [piperacillin Sod-tazobactam So] Rash   Pink macular rash on back, chest, shoulders      Medication List    TAKE these medications   amLODipine 2.5 MG tablet Commonly known as:  NORVASC Take 1 tablet (2.5 mg total) by mouth daily.   aspirin 325 MG tablet Take 1 tablet (325 mg total) by mouth daily. What changed:  medication strength  how much to take   atorvastatin 20 MG tablet Commonly known as:  LIPITOR Take 1 tablet (20 mg total) by mouth daily at 6 PM.   EYE-VITE PLUS LUTEIN Caps Take 1 capsule by mouth 2 (two) times daily.   ibrutinib 140 MG capsul Commonly known as:   IMBRUVICA Take 2 capsules (280 mg total) by mouth daily.   NAMENDA XR 28 MG Cp24 24 hr capsule Generic drug:  memantine Take 28 mg by mouth daily.   ranitidine 75 MG tablet Commonly known as:  ZANTAC Take 75 mg by mouth 2 (two) times daily. 2 tablets before am and evening meds            Discharge Care Instructions        Start     Ordered   08/25/17 0000  aspirin 325 MG tablet  Daily     08/24/17 1135   08/24/17 0000  atorvastatin (LIPITOR) 20 MG tablet  Daily-1800     08/24/17 1135   08/24/17 0000  Increase activity slowly     08/24/17 1135   08/24/17 0000  Diet - low sodium heart healthy     08/24/17 1135   08/24/17 0000  Discharge instructions    Comments:  Follow up with PCP, Neurology, and Cardiology as an outpatient. Take all medications as prescribed. If symptoms change or worsen please return to the ED for Evaluation.   08/24/17 1135   08/24/17 0000  Call MD for:  temperature >100.4     08/24/17 1135   08/24/17 0000  Call MD for:  persistant nausea and vomiting     08/24/17 1135   08/24/17 0000  Call MD for:  severe uncontrolled pain     08/24/17 1135   08/24/17 0000  Call MD for:  difficulty breathing, headache or visual disturbances     08/24/17 1135   08/24/17 0000  Call MD for:  persistant dizziness or light-headedness     08/24/17 1135   08/24/17 0000  Call MD for:  extreme fatigue     08/24/17 1135   08/24/17 0000  Call MD for:  hives     08/24/17 1135   08/24/17 0000  Call MD for:  redness, tenderness, or signs of infection (pain, swelling, redness, odor or green/yellow discharge around incision site)     08/24/17 1135   08/24/17 0000  Ambulatory referral to Neurology    Comments:  An appointment is requested in approximately: Follow up with Cecille Rubin, FNP in 6 weeks for CVA   08/24/17 1135   08/24/17 0000  amLODipine (NORVASC) 2.5 MG tablet  Daily     08/24/17 1147   08/23/17 0000  Ambulatory referral to Neurology    Comments:  Follow up  with stroke clinic Cecille Rubin preferred, if not available, then consider Caesar Chestnut, [facility] or Jaynee Eagles whoever is available) at [facility] in about 6-8 weeks. Thanks.   08/23/17 2036     Follow-up Information    Dennie Bible, NP. Schedule an appointment as soon as  possible for a visit in 6 week(s).   Specialty:  Family Medicine Contact information: [address] [place] [address] [phone]        Lajean Manes, MD. Call.   Specialty:  Internal Medicine Why:  Call to follow up within 1 week Contact information: [place] [employer] [address] [place] [address] [phone]        Jalene Mullet, MD. Call in 1 week(s).   Specialty:  Ophthalmology Why:  Follow up within 1 week Contact information: [address] [place] [address] [phone]          Allergies  Allergen Reactions  . Aricept [Donepezil Hcl] Other (See Comments)    Unknown   . Zosyn [Piperacillin Sod-Tazobactam So] Rash    Pink macular rash on back, chest, shoulders   Consultations:  Neurology Stroke Team  Procedures/Studies: Ct Head Wo Contrast  Result Date: 08/22/2017 CLINICAL DATA:  Fuzziness right eye 1 week.  Dementia. EXAM: CT HEAD WITHOUT CONTRAST TECHNIQUE: Contiguous axial images were obtained from the base of the skull through the vertex without intravenous contrast. COMPARISON:  03/18/2015 FINDINGS: Brain: Ventricles, cisterns and other CSF spaces are within normal. There is chronic ischemic microvascular disease present. No evidence of mass, mass effect, shift of midline structures or acute hemorrhage. No acute infarction. Vascular: No hyperdense vessel or unexpected calcification. Skull: Normal. Negative for fracture or focal lesion. Sinuses/Orbits: The orbits are normal. Small polyp versus mucous retention cyst over the floor the right maxillary sinus. Other: None. IMPRESSION: No acute intracranial findings. Mild chronic ischemic microvascular  disease. Electronically Signed   By: Marin Olp M.D.   On: 08/22/2017 20:24   Mr Jeffrey Simmons Head Wo Contrast  Result Date: 08/23/2017 CLINICAL DATA:  83 y/o M; vision changes in the right eye. History of mantle cell lymphoma and thrombocytopenia. EXAM: MRI HEAD WITHOUT AND WITH CONTRAST MRA HEAD WITHOUT CONTRAST MRA NECK WITHOUT AND WITH CONTRAST TECHNIQUE: Multiplanar, multiecho pulse sequences of the brain and surrounding structures were obtained without and with intravenous contrast. Angiographic images of the head were obtained using MRA technique without contrast. Angiographic images of the neck were obtained using MRA technique without and with contrast. CONTRAST:  59m MULTIHANCE GADOBENATE DIMEGLUMINE 529 MG/ML IV SOLN COMPARISON:  The 08/22/2017 CT of the head. FINDINGS: MRI HEAD FINDINGS Brain: Left parietal lobe punctate subcortical focus of reduced diffusion compatible with small vessel acute/early subacute infarction (series 4, image 28) no additional diffusion signal abnormality. Patchy nonspecific foci of T2 FLAIR hyperintense signal abnormality in subcortical and periventricular white matter are compatible with moderate chronic microvascular ischemic changes for age. Moderate brain parenchymal volume loss. No hydrocephalus, focal mass effect, or extra-axial collection. No effacement of basilar cisterns. There several scattered punctate foci of susceptibility hypointensity best appreciated in left posterior temporal lobe, right periatrial white matter, and the right frontal centrum semiovale which are compatible with hemosiderin deposition from prior microhemorrhage. After administration of intravenous contrast there is no abnormal enhancement of the brain. Vascular: As below. Skull and upper cervical spine: Normal marrow signal. Sinuses/Orbits: Mild diffuse ethmoid sinus mucosal thickening. No significant abnormal signal of mastoid air cells. Bilateral intra-ocular lens replacement. Other: None. MRA  HEAD FINDINGS Internal carotid arteries: Patent. 3 mm inferiorly directed outpouching of left paraclinoid ICA (series 5, image 120) which may represent a small aneurysm or infundibular origin of diminutive vessel. Anterior cerebral arteries:  Patent. Middle cerebral arteries: Patent. Mild right mid M1 and left distal M1  stenosis. Anterior communicating artery: Patent. Posterior communicating arteries: Fetal right PCA. Probable diminutive left posterior communicating artery. Posterior cerebral arteries: Patent. Fetal right PCA. Segments of mild-to-moderate stenosis and bilateral P2 and P3 distributions. Basilar artery:  Patent. Vertebral arteries:  Patent. No additional evidence of high-grade stenosis, large vessel occlusion, or aneurysm unless noted above. MRA NECK FINDINGS Aortic arch: Patent. Right common carotid artery: Patent. Plaque at the right carotid bifurcation with mild less than 50% distal common carotid artery stenosis just proximal to the bifurcation. Right internal carotid artery: Patent. Right vertebral artery: Patent. Left common carotid artery: Patent. Left Internal carotid artery: Patent. Left Vertebral artery: Patent. There is no evidence of hemodynamically significant stenosis by NASCET criteria, occlusion, or aneurysm. IMPRESSION: MRI head: 1. Punctate acute/early subacute small vessel infarct within left parietal subcortical white matter. No acute hemorrhage or mass effect. 2. Moderate chronic microvascular ischemic changes and moderate parenchymal volume loss of the brain. Scattered foci of chronic microhemorrhage in a nonspecific distribution, probably related to hypertension, less likely amyloid vasculopathy. 3. No abnormal enhancement of the brain. MRA head: 1. Patent circle of Willis. No large vessel occlusion, aneurysm, or significant stenosis is identified. 2. Foci of mild stenosis and bilateral M1 and mild-to-moderate stenosis of bilateral P2/P3 segments. MRA neck: 1. Patent carotid and  vertebral arteries. No hemodynamically significant stenosis by NASCET criteria, dissection, occlusion, or aneurysm. 2. Mild atherosclerosis of right carotid bifurcation with mild less than 50% distal common carotid artery stenosis. Electronically Signed   By: Kristine Garbe M.D.   On: 08/23/2017 05:19   Mr Angiogram Neck W Or Wo Contrast  Result Date: 08/23/2017 CLINICAL DATA:  83 y/o M; vision changes in the right eye. History of mantle cell lymphoma and thrombocytopenia. EXAM: MRI HEAD WITHOUT AND WITH CONTRAST MRA HEAD WITHOUT CONTRAST MRA NECK WITHOUT AND WITH CONTRAST TECHNIQUE: Multiplanar, multiecho pulse sequences of the brain and surrounding structures were obtained without and with intravenous contrast. Angiographic images of the head were obtained using MRA technique without contrast. Angiographic images of the neck were obtained using MRA technique without and with contrast. CONTRAST:  51m MULTIHANCE GADOBENATE DIMEGLUMINE 529 MG/ML IV SOLN COMPARISON:  The 08/22/2017 CT of the head. FINDINGS: MRI HEAD FINDINGS Brain: Left parietal lobe punctate subcortical focus of reduced diffusion compatible with small vessel acute/early subacute infarction (series 4, image 28) no additional diffusion signal abnormality. Patchy nonspecific foci of T2 FLAIR hyperintense signal abnormality in subcortical and periventricular white matter are compatible with moderate chronic microvascular ischemic changes for age. Moderate brain parenchymal volume loss. No hydrocephalus, focal mass effect, or extra-axial collection. No effacement of basilar cisterns. There several scattered punctate foci of susceptibility hypointensity best appreciated in left posterior temporal lobe, right periatrial white matter, and the right frontal centrum semiovale which are compatible with hemosiderin deposition from prior microhemorrhage. After administration of intravenous contrast there is no abnormal enhancement of the brain.  Vascular: As below. Skull and upper cervical spine: Normal marrow signal. Sinuses/Orbits: Mild diffuse ethmoid sinus mucosal thickening. No significant abnormal signal of mastoid air cells. Bilateral intra-ocular lens replacement. Other: None. MRA HEAD FINDINGS Internal carotid arteries: Patent. 3 mm inferiorly directed outpouching of left paraclinoid ICA (series 5, image 120) which may represent a small aneurysm or infundibular origin of diminutive vessel. Anterior cerebral arteries:  Patent. Middle cerebral arteries: Patent. Mild right mid M1 and left distal M1 stenosis. Anterior communicating artery: Patent. Posterior communicating arteries: Fetal right PCA. Probable diminutive left posterior communicating artery. Posterior cerebral  arteries: Patent. Fetal right PCA. Segments of mild-to-moderate stenosis and bilateral P2 and P3 distributions. Basilar artery:  Patent. Vertebral arteries:  Patent. No additional evidence of high-grade stenosis, large vessel occlusion, or aneurysm unless noted above. MRA NECK FINDINGS Aortic arch: Patent. Right common carotid artery: Patent. Plaque at the right carotid bifurcation with mild less than 50% distal common carotid artery stenosis just proximal to the bifurcation. Right internal carotid artery: Patent. Right vertebral artery: Patent. Left common carotid artery: Patent. Left Internal carotid artery: Patent. Left Vertebral artery: Patent. There is no evidence of hemodynamically significant stenosis by NASCET criteria, occlusion, or aneurysm. IMPRESSION: MRI head: 1. Punctate acute/early subacute small vessel infarct within left parietal subcortical white matter. No acute hemorrhage or mass effect. 2. Moderate chronic microvascular ischemic changes and moderate parenchymal volume loss of the brain. Scattered foci of chronic microhemorrhage in a nonspecific distribution, probably related to hypertension, less likely amyloid vasculopathy. 3. No abnormal enhancement of the  brain. MRA head: 1. Patent circle of Willis. No large vessel occlusion, aneurysm, or significant stenosis is identified. 2. Foci of mild stenosis and bilateral M1 and mild-to-moderate stenosis of bilateral P2/P3 segments. MRA neck: 1. Patent carotid and vertebral arteries. No hemodynamically significant stenosis by NASCET criteria, dissection, occlusion, or aneurysm. 2. Mild atherosclerosis of right carotid bifurcation with mild less than 50% distal common carotid artery stenosis. Electronically Signed   By: Kristine Garbe M.D.   On: 08/23/2017 05:19   Mr Jeri Cos And Wo Contrast  Result Date: 08/23/2017 CLINICAL DATA:  83 y/o M; vision changes in the right eye. History of mantle cell lymphoma and thrombocytopenia. EXAM: MRI HEAD WITHOUT AND WITH CONTRAST MRA HEAD WITHOUT CONTRAST MRA NECK WITHOUT AND WITH CONTRAST TECHNIQUE: Multiplanar, multiecho pulse sequences of the brain and surrounding structures were obtained without and with intravenous contrast. Angiographic images of the head were obtained using MRA technique without contrast. Angiographic images of the neck were obtained using MRA technique without and with contrast. CONTRAST:  36m MULTIHANCE GADOBENATE DIMEGLUMINE 529 MG/ML IV SOLN COMPARISON:  The 08/22/2017 CT of the head. FINDINGS: MRI HEAD FINDINGS Brain: Left parietal lobe punctate subcortical focus of reduced diffusion compatible with small vessel acute/early subacute infarction (series 4, image 28) no additional diffusion signal abnormality. Patchy nonspecific foci of T2 FLAIR hyperintense signal abnormality in subcortical and periventricular white matter are compatible with moderate chronic microvascular ischemic changes for age. Moderate brain parenchymal volume loss. No hydrocephalus, focal mass effect, or extra-axial collection. No effacement of basilar cisterns. There several scattered punctate foci of susceptibility hypointensity best appreciated in left posterior temporal  lobe, right periatrial white matter, and the right frontal centrum semiovale which are compatible with hemosiderin deposition from prior microhemorrhage. After administration of intravenous contrast there is no abnormal enhancement of the brain. Vascular: As below. Skull and upper cervical spine: Normal marrow signal. Sinuses/Orbits: Mild diffuse ethmoid sinus mucosal thickening. No significant abnormal signal of mastoid air cells. Bilateral intra-ocular lens replacement. Other: None. MRA HEAD FINDINGS Internal carotid arteries: Patent. 3 mm inferiorly directed outpouching of left paraclinoid ICA (series 5, image 120) which may represent a small aneurysm or infundibular origin of diminutive vessel. Anterior cerebral arteries:  Patent. Middle cerebral arteries: Patent. Mild right mid M1 and left distal M1 stenosis. Anterior communicating artery: Patent. Posterior communicating arteries: Fetal right PCA. Probable diminutive left posterior communicating artery. Posterior cerebral arteries: Patent. Fetal right PCA. Segments of mild-to-moderate stenosis and bilateral P2 and P3 distributions. Basilar artery:  Patent. Vertebral  arteries:  Patent. No additional evidence of high-grade stenosis, large vessel occlusion, or aneurysm unless noted above. MRA NECK FINDINGS Aortic arch: Patent. Right common carotid artery: Patent. Plaque at the right carotid bifurcation with mild less than 50% distal common carotid artery stenosis just proximal to the bifurcation. Right internal carotid artery: Patent. Right vertebral artery: Patent. Left common carotid artery: Patent. Left Internal carotid artery: Patent. Left Vertebral artery: Patent. There is no evidence of hemodynamically significant stenosis by NASCET criteria, occlusion, or aneurysm. IMPRESSION: MRI head: 1. Punctate acute/early subacute small vessel infarct within left parietal subcortical white matter. No acute hemorrhage or mass effect. 2. Moderate chronic microvascular  ischemic changes and moderate parenchymal volume loss of the brain. Scattered foci of chronic microhemorrhage in a nonspecific distribution, probably related to hypertension, less likely amyloid vasculopathy. 3. No abnormal enhancement of the brain. MRA head: 1. Patent circle of Willis. No large vessel occlusion, aneurysm, or significant stenosis is identified. 2. Foci of mild stenosis and bilateral M1 and mild-to-moderate stenosis of bilateral P2/P3 segments. MRA neck: 1. Patent carotid and vertebral arteries. No hemodynamically significant stenosis by NASCET criteria, dissection, occlusion, or aneurysm. 2. Mild atherosclerosis of right carotid bifurcation with mild less than 50% distal common carotid artery stenosis. Electronically Signed   By: Kristine Garbe M.D.   On: 08/23/2017 05:19   US Carotid Bilateral  Result Date: 08/20/2017 CLINICAL DATA:  Amaurosis fugax of the the right eye. History of hypertension. Former smoker. EXAM: BILATERAL CAROTID DUPLEX ULTRASOUND TECHNIQUE: Pearline Cables scale imaging, color Doppler and duplex ultrasound were performed of bilateral carotid and vertebral arteries in the neck. COMPARISON:  None. FINDINGS: Criteria: Quantification of carotid stenosis is based on velocity parameters that correlate the residual internal carotid diameter with NASCET-based stenosis levels, using the diameter of the distal internal carotid lumen as the denominator for stenosis measurement. The following velocity measurements were obtained: RIGHT ICA:  87/15 cm/sec CCA:  34/1 cm/sec SYSTOLIC ICA/CCA RATIO:  1.0 DIASTOLIC ICA/CCA RATIO:  2.0 ECA:  122 cm/sec LEFT ICA:  72/12 cm/sec CCA:  96/2 cm/sec SYSTOLIC ICA/CCA RATIO:  0.7 DIASTOLIC ICA/CCA RATIO:  2.4 ECA:  92 cm/sec RIGHT CAROTID ARTERY: There is a moderate amount of echogenic shadowing plaque within the right carotid bulb (Images 5 and 12), extending to involve the origin and proximal aspects of the right internal carotid artery (image 14),  not resulting in elevated peak systolic velocities within the interrogated course the right internal carotid artery to suggest a hemodynamically significant stenosis. RIGHT VERTEBRAL ARTERY:  Antegrade flow LEFT CAROTID ARTERY: There is a minimal amount of atherosclerotic plaque within the left carotid bulb (image 36), extending to involve the origin and proximal aspects of the left internal carotid artery (image 39, not resulting in elevated peak systolic velocities within the interrogated course the left internal carotid artery to suggest a hemodynamically significant stenosis. LEFT VERTEBRAL ARTERY:  Antegrade flow IMPRESSION: Minimal to moderate amount of bilateral atherosclerotic plaque, right greater than left, not resulting in a hemodynamically significant stenosis within either internal carotid artery. Electronically Signed   By: Sandi Mariscal M.D.   On: 08/20/2017 12:20   ECHOCARDIOGRAM Study Conclusions  - Left ventricle: The cavity size was normal. Systolic function was normal. The estimated ejection fraction was in the range of 60% to 65%. Wall motion was normal; there were no regional wall motion abnormalities. Doppler parameters are consistent with abnormal left ventricular relaxation (grade 1 diastolic dysfunction). - Aortic valve: There was mild to moderate regurgitation. -  Mitral valve: There was mild regurgitation. - Left atrium: The atrium was moderately to severely dilated. - Right atrium: The atrium was moderately dilated.  Subjective: Seen and examined at bedside and was confused and demented. Wife states he was agitated all night and did not rest. No new complaints and ready to go home.  Discharge Exam: Vitals:   08/23/17 2140 08/24/17 0537  BP: (!) 171/80 (!) 188/89  Pulse: 72 88  Resp: 20 (!) 21  Temp: 98.1 F (36.7 C) 98.2 F (36.8 C)  SpO2: 93% 98%   Vitals:   08/23/17 0416 08/23/17 1426 08/23/17 2140 08/24/17 0537  BP:  (!) 181/62 (!) 171/80 (!)  188/89  Pulse:  61 72 88  Resp:  20 20 (!) 21  Temp:  97.7 F (36.5 C) 98.1 F (36.7 C) 98.2 F (36.8 C)  TempSrc:  Oral Oral Axillary  SpO2:  100% 93% 98%  Weight: 70.2 kg (154 lb 11.2 oz)     Height: _0  (1.854 m)      General: Pt is pleasantly demented, awake, not in acute distress Cardiovascular: RRR, S1/S2 +, no rubs, no gallops Respiratory: CTA bilaterally, no wheezing, no rhonchi Abdominal: Soft, NT, ND, bowel sounds + Extremities: no edema, no cyanosis  The results of significant diagnostics from this hospitalization (including imaging, microbiology, ancillary and laboratory) are listed below for reference.    Microbiology: No results found for this or any previous visit (from the past 240 hour(s)).   Labs: BNP (last 3 results) No results for input(s): BNP in the last 8760 hours. Basic Metabolic Panel:  Recent Labs Lab 08/19/17 1030 08/22/17 1707 08/23/17 0427 08/23/17 1520  NA 140 137 139  --   K 4.6 4.7 3.9  --   CL  --  106 108  --   CO2 _1 --   GLUCOSE 78 89 94  --   BUN 20.9 25* 18  --   CREATININE 1.4* 1.53* 1.16 1.28*  CALCIUM 9.1 8.5* 8.5*  --    Liver Function Tests:  Recent Labs Lab 08/19/17 1030 08/22/17 1707 08/23/17 0427  AST _2 ALT 10 14* 13*  ALKPHOS 89 73 70  BILITOT 1.19 0.9 1.2  PROT 5.9* 5.2* 5.2*  ALBUMIN 3.6 3.4* 3.5   No results for input(s): LIPASE, AMYLASE in the last 168 hours. No results for input(s): AMMONIA in the last 168 hours. CBC:  Recent Labs Lab 08/19/17 1030 08/22/17 1707 08/23/17 0427  WBC 5.9 6.0 5.8  NEUTROABS 3.9 3.8  --   HGB 14.5 13.3 13.9  HCT 42.6 39.4 40.9  MCV 94.2 93.1 91.7  PLT 96* PLATELET CLUMPS NOTED ON SMEAR, UNABLE TO ESTIMATE 86*   Cardiac Enzymes: No results for input(s): CKTOTAL, CKMB, CKMBINDEX, TROPONINI in the last 168 hours. BNP: Invalid input(s): POCBNP CBG:  Recent Labs Lab 08/23/17 1641  GLUCAP 119*   D-Dimer No results for input(s): DDIMER in the  last 72 hours. Hgb A1c  Recent Labs  08/23/17 0427  HGBA1C 4.7*   Lipid Profile  Recent Labs  08/23/17 0427  CHOL 179  HDL 36*  LDLCALC 117*  TRIG 132  CHOLHDL 5.0   Thyroid function studies No results for input(s): TSH, T4TOTAL, T3FREE, THYROIDAB in the last 72 hours.  Invalid input(s): FREET3 Anemia work up No results for input(s): VITAMINB12, FOLATE, FERRITIN, TIBC, IRON, RETICCTPCT in the last 72 hours. Urinalysis    Component Value Date/Time   COLORURINE AMBER (  A) 03/18/2015 0412   APPEARANCEUR CLEAR 03/18/2015 0412   LABSPEC 1.018 03/18/2015 0412   LABSPEC 1.025 08/25/2014 1035   PHURINE 5.5 03/18/2015 0412   GLUCOSEU NEGATIVE 03/18/2015 0412   GLUCOSEU Negative 08/25/2014 1035   HGBUR MODERATE (A) 03/18/2015 0412   BILIRUBINUR NEGATIVE 03/18/2015 0412   BILIRUBINUR Negative 08/25/2014 1035   KETONESUR NEGATIVE 03/18/2015 0412   PROTEINUR 100 (A) 03/18/2015 0412   UROBILINOGEN 1.0 03/18/2015 0412   UROBILINOGEN 0.2 08/25/2014 1035   NITRITE NEGATIVE 03/18/2015 0412   LEUKOCYTESUR NEGATIVE 03/18/2015 0412   LEUKOCYTESUR Trace 08/25/2014 1035   Sepsis Labs Invalid input(s): PROCALCITONIN,  WBC,  LACTICIDVEN Microbiology No results found for this or any previous visit (from the past 240 hour(s)).  Time coordinating discharge: 35 minutes  SIGNED:  Kerney Elbe, DO [facility] 08/24/2017, 11:53 AM Pager [phone]  If 7PM-7AM, please contact night-coverage [email or web address] Password [email or web address]

## 2017-08-24 NOTE — Progress Notes (Signed)
Pt was still agitated and not able to relax. NP on call was notified. Orders for Haldol was put in. Will administer and continue to monitor.

## 2017-08-24 NOTE — Care Management Note (Signed)
Case Management Note  Patient Details  Name: Jeffrey Simmons MRN: [id number] Date of Birth: 01/28/1927  Subjective/Objective:                 [place] and [place] at [facility] at [employer] request. They state that the nursing care they could provide would be fee for service. Patient wife stated she did not have a preference for [facility] provider if ILF could not do it, referral placed to [facility].    Action/Plan:  Dc to home w HH.  Expected Discharge Date:  08/24/17               Expected Discharge Plan:  [place]  In-House Referral:     Discharge planning Services  CM Consult  Post Acute Care Choice:  Home Health Choice offered to:  Spouse  DME Arranged:    DME Agency:     HH Arranged:  RN, PT, OT, Nurse's Aide [place] Agency:  [place]  Status of Service:  Completed, signed off  If discussed at [place] of Stay Meetings, dates discussed:    Additional Comments:  Carles Collet, RN 08/24/2017, 12:12 PM

## 2017-08-24 NOTE — Progress Notes (Signed)
Jeffrey Simmons to be D/C'd Home per MD order.  Discussed with the patient and all questions fully answered.  VSS, IV catheter discontinued intact. Site without signs and symptoms of complications. Dressing and pressure applied.  An After Visit Summary was printed and given to the patient. Patient received prescription.  D/c education completed with patient/family including follow up instructions, medication list, d/c activities limitations if indicated, with other d/c instructions as indicated by MD - patient able to verbalize understanding, all questions fully answered.   Patient instructed to return to ED, call 911, or call MD for any changes in condition.   Patient escorted via [place], and D/C home via private auto.  [name] D/C packet was left in the room. Attempted to call pt, but pt did not answer.  Christoper Fabian Rashawd Laskaris 08/24/2017 2:58 PM

## 2017-08-24 NOTE — Progress Notes (Signed)
Pt had a run of SVT. Pt was asymptomatic. NP on file notified. No new orders. Will continue to monitor.

## 2017-08-27 DIAGNOSIS — F0280 Dementia in other diseases classified elsewhere without behavioral disturbance: Secondary | ICD-10-CM | POA: Diagnosis not present

## 2017-08-27 DIAGNOSIS — H5461 Unqualified visual loss, right eye, normal vision left eye: Secondary | ICD-10-CM | POA: Diagnosis not present

## 2017-08-27 DIAGNOSIS — G301 Alzheimer's disease with late onset: Secondary | ICD-10-CM | POA: Diagnosis not present

## 2017-08-27 DIAGNOSIS — N183 Chronic kidney disease, stage 3 (moderate): Secondary | ICD-10-CM | POA: Diagnosis not present

## 2017-08-27 DIAGNOSIS — C8313 Mantle cell lymphoma, intra-abdominal lymph nodes: Secondary | ICD-10-CM | POA: Diagnosis not present

## 2017-08-27 DIAGNOSIS — Z7189 Other specified counseling: Secondary | ICD-10-CM | POA: Diagnosis not present

## 2017-08-27 DIAGNOSIS — Z79899 Other long term (current) drug therapy: Secondary | ICD-10-CM | POA: Diagnosis not present

## 2017-08-27 DIAGNOSIS — Z23 Encounter for immunization: Secondary | ICD-10-CM | POA: Diagnosis not present

## 2017-08-27 DIAGNOSIS — I10 Essential (primary) hypertension: Secondary | ICD-10-CM | POA: Diagnosis not present

## 2017-08-27 DIAGNOSIS — I129 Hypertensive chronic kidney disease with stage 1 through stage 4 chronic kidney disease, or unspecified chronic kidney disease: Secondary | ICD-10-CM | POA: Diagnosis not present

## 2017-08-27 DIAGNOSIS — I69398 Other sequelae of cerebral infarction: Secondary | ICD-10-CM | POA: Diagnosis not present

## 2017-08-27 DIAGNOSIS — R350 Frequency of micturition: Secondary | ICD-10-CM | POA: Diagnosis not present

## 2017-08-27 DIAGNOSIS — H3411 Central retinal artery occlusion, right eye: Secondary | ICD-10-CM | POA: Diagnosis not present

## 2017-08-27 DIAGNOSIS — H538 Other visual disturbances: Secondary | ICD-10-CM | POA: Diagnosis not present

## 2017-08-28 DIAGNOSIS — H538 Other visual disturbances: Secondary | ICD-10-CM | POA: Diagnosis not present

## 2017-08-28 DIAGNOSIS — G301 Alzheimer's disease with late onset: Secondary | ICD-10-CM | POA: Diagnosis not present

## 2017-08-28 DIAGNOSIS — H5461 Unqualified visual loss, right eye, normal vision left eye: Secondary | ICD-10-CM | POA: Diagnosis not present

## 2017-08-28 DIAGNOSIS — N183 Chronic kidney disease, stage 3 (moderate): Secondary | ICD-10-CM | POA: Diagnosis not present

## 2017-08-28 DIAGNOSIS — I69398 Other sequelae of cerebral infarction: Secondary | ICD-10-CM | POA: Diagnosis not present

## 2017-08-28 DIAGNOSIS — I129 Hypertensive chronic kidney disease with stage 1 through stage 4 chronic kidney disease, or unspecified chronic kidney disease: Secondary | ICD-10-CM | POA: Diagnosis not present

## 2017-08-29 DIAGNOSIS — I69398 Other sequelae of cerebral infarction: Secondary | ICD-10-CM | POA: Diagnosis not present

## 2017-08-29 DIAGNOSIS — H5461 Unqualified visual loss, right eye, normal vision left eye: Secondary | ICD-10-CM | POA: Diagnosis not present

## 2017-08-29 DIAGNOSIS — G301 Alzheimer's disease with late onset: Secondary | ICD-10-CM | POA: Diagnosis not present

## 2017-08-29 DIAGNOSIS — H538 Other visual disturbances: Secondary | ICD-10-CM | POA: Diagnosis not present

## 2017-08-29 DIAGNOSIS — N183 Chronic kidney disease, stage 3 (moderate): Secondary | ICD-10-CM | POA: Diagnosis not present

## 2017-08-29 DIAGNOSIS — I129 Hypertensive chronic kidney disease with stage 1 through stage 4 chronic kidney disease, or unspecified chronic kidney disease: Secondary | ICD-10-CM | POA: Diagnosis not present

## 2017-09-04 DIAGNOSIS — N183 Chronic kidney disease, stage 3 (moderate): Secondary | ICD-10-CM | POA: Diagnosis not present

## 2017-09-04 DIAGNOSIS — I69398 Other sequelae of cerebral infarction: Secondary | ICD-10-CM | POA: Diagnosis not present

## 2017-09-04 DIAGNOSIS — G301 Alzheimer's disease with late onset: Secondary | ICD-10-CM | POA: Diagnosis not present

## 2017-09-04 DIAGNOSIS — H5461 Unqualified visual loss, right eye, normal vision left eye: Secondary | ICD-10-CM | POA: Diagnosis not present

## 2017-09-04 DIAGNOSIS — H538 Other visual disturbances: Secondary | ICD-10-CM | POA: Diagnosis not present

## 2017-09-04 DIAGNOSIS — I129 Hypertensive chronic kidney disease with stage 1 through stage 4 chronic kidney disease, or unspecified chronic kidney disease: Secondary | ICD-10-CM | POA: Diagnosis not present

## 2017-09-05 DIAGNOSIS — I129 Hypertensive chronic kidney disease with stage 1 through stage 4 chronic kidney disease, or unspecified chronic kidney disease: Secondary | ICD-10-CM | POA: Diagnosis not present

## 2017-09-05 DIAGNOSIS — H538 Other visual disturbances: Secondary | ICD-10-CM | POA: Diagnosis not present

## 2017-09-05 DIAGNOSIS — N183 Chronic kidney disease, stage 3 (moderate): Secondary | ICD-10-CM | POA: Diagnosis not present

## 2017-09-05 DIAGNOSIS — G301 Alzheimer's disease with late onset: Secondary | ICD-10-CM | POA: Diagnosis not present

## 2017-09-05 DIAGNOSIS — H5461 Unqualified visual loss, right eye, normal vision left eye: Secondary | ICD-10-CM | POA: Diagnosis not present

## 2017-09-05 DIAGNOSIS — I69398 Other sequelae of cerebral infarction: Secondary | ICD-10-CM | POA: Diagnosis not present

## 2017-09-06 DIAGNOSIS — H538 Other visual disturbances: Secondary | ICD-10-CM | POA: Diagnosis not present

## 2017-09-06 DIAGNOSIS — I69398 Other sequelae of cerebral infarction: Secondary | ICD-10-CM | POA: Diagnosis not present

## 2017-09-06 DIAGNOSIS — G301 Alzheimer's disease with late onset: Secondary | ICD-10-CM | POA: Diagnosis not present

## 2017-09-06 DIAGNOSIS — N183 Chronic kidney disease, stage 3 (moderate): Secondary | ICD-10-CM | POA: Diagnosis not present

## 2017-09-06 DIAGNOSIS — H5461 Unqualified visual loss, right eye, normal vision left eye: Secondary | ICD-10-CM | POA: Diagnosis not present

## 2017-09-06 DIAGNOSIS — I129 Hypertensive chronic kidney disease with stage 1 through stage 4 chronic kidney disease, or unspecified chronic kidney disease: Secondary | ICD-10-CM | POA: Diagnosis not present

## 2017-09-10 DIAGNOSIS — H35033 Hypertensive retinopathy, bilateral: Secondary | ICD-10-CM | POA: Diagnosis not present

## 2017-09-10 DIAGNOSIS — H35362 Drusen (degenerative) of macula, left eye: Secondary | ICD-10-CM | POA: Diagnosis not present

## 2017-09-10 DIAGNOSIS — H3411 Central retinal artery occlusion, right eye: Secondary | ICD-10-CM | POA: Diagnosis not present

## 2017-09-10 DIAGNOSIS — H3561 Retinal hemorrhage, right eye: Secondary | ICD-10-CM | POA: Diagnosis not present

## 2017-09-10 DIAGNOSIS — H353211 Exudative age-related macular degeneration, right eye, with active choroidal neovascularization: Secondary | ICD-10-CM | POA: Diagnosis not present

## 2017-09-11 DIAGNOSIS — H538 Other visual disturbances: Secondary | ICD-10-CM | POA: Diagnosis not present

## 2017-09-11 DIAGNOSIS — I129 Hypertensive chronic kidney disease with stage 1 through stage 4 chronic kidney disease, or unspecified chronic kidney disease: Secondary | ICD-10-CM | POA: Diagnosis not present

## 2017-09-11 DIAGNOSIS — I69398 Other sequelae of cerebral infarction: Secondary | ICD-10-CM | POA: Diagnosis not present

## 2017-09-11 DIAGNOSIS — N183 Chronic kidney disease, stage 3 (moderate): Secondary | ICD-10-CM | POA: Diagnosis not present

## 2017-09-11 DIAGNOSIS — G301 Alzheimer's disease with late onset: Secondary | ICD-10-CM | POA: Diagnosis not present

## 2017-09-11 DIAGNOSIS — H5461 Unqualified visual loss, right eye, normal vision left eye: Secondary | ICD-10-CM | POA: Diagnosis not present

## 2017-09-12 ENCOUNTER — Other Ambulatory Visit: Payer: Self-pay

## 2017-09-12 MED ORDER — IBRUTINIB 140 MG PO CAPS: 280.0000 mg | ORAL_CAPSULE | Freq: Every day | ORAL | 11 refills | Status: DC

## 2017-09-13 DIAGNOSIS — N183 Chronic kidney disease, stage 3 (moderate): Secondary | ICD-10-CM | POA: Diagnosis not present

## 2017-09-13 DIAGNOSIS — I129 Hypertensive chronic kidney disease with stage 1 through stage 4 chronic kidney disease, or unspecified chronic kidney disease: Secondary | ICD-10-CM | POA: Diagnosis not present

## 2017-09-13 DIAGNOSIS — H538 Other visual disturbances: Secondary | ICD-10-CM | POA: Diagnosis not present

## 2017-09-13 DIAGNOSIS — H5461 Unqualified visual loss, right eye, normal vision left eye: Secondary | ICD-10-CM | POA: Diagnosis not present

## 2017-09-13 DIAGNOSIS — I69398 Other sequelae of cerebral infarction: Secondary | ICD-10-CM | POA: Diagnosis not present

## 2017-09-13 DIAGNOSIS — G301 Alzheimer's disease with late onset: Secondary | ICD-10-CM | POA: Diagnosis not present

## 2017-09-18 DIAGNOSIS — I129 Hypertensive chronic kidney disease with stage 1 through stage 4 chronic kidney disease, or unspecified chronic kidney disease: Secondary | ICD-10-CM | POA: Diagnosis not present

## 2017-09-18 DIAGNOSIS — H5461 Unqualified visual loss, right eye, normal vision left eye: Secondary | ICD-10-CM | POA: Diagnosis not present

## 2017-09-18 DIAGNOSIS — G301 Alzheimer's disease with late onset: Secondary | ICD-10-CM | POA: Diagnosis not present

## 2017-09-18 DIAGNOSIS — H538 Other visual disturbances: Secondary | ICD-10-CM | POA: Diagnosis not present

## 2017-09-18 DIAGNOSIS — N183 Chronic kidney disease, stage 3 (moderate): Secondary | ICD-10-CM | POA: Diagnosis not present

## 2017-09-18 DIAGNOSIS — I69398 Other sequelae of cerebral infarction: Secondary | ICD-10-CM | POA: Diagnosis not present

## 2017-09-24 DIAGNOSIS — N183 Chronic kidney disease, stage 3 (moderate): Secondary | ICD-10-CM | POA: Diagnosis not present

## 2017-09-24 DIAGNOSIS — G301 Alzheimer's disease with late onset: Secondary | ICD-10-CM | POA: Diagnosis not present

## 2017-09-24 DIAGNOSIS — I129 Hypertensive chronic kidney disease with stage 1 through stage 4 chronic kidney disease, or unspecified chronic kidney disease: Secondary | ICD-10-CM | POA: Diagnosis not present

## 2017-09-24 DIAGNOSIS — H5461 Unqualified visual loss, right eye, normal vision left eye: Secondary | ICD-10-CM | POA: Diagnosis not present

## 2017-09-24 DIAGNOSIS — I69398 Other sequelae of cerebral infarction: Secondary | ICD-10-CM | POA: Diagnosis not present

## 2017-09-24 DIAGNOSIS — H538 Other visual disturbances: Secondary | ICD-10-CM | POA: Diagnosis not present

## 2017-10-02 DIAGNOSIS — N183 Chronic kidney disease, stage 3 (moderate): Secondary | ICD-10-CM | POA: Diagnosis not present

## 2017-10-02 DIAGNOSIS — D696 Thrombocytopenia, unspecified: Secondary | ICD-10-CM | POA: Diagnosis not present

## 2017-10-02 DIAGNOSIS — I700 Atherosclerosis of aorta: Secondary | ICD-10-CM | POA: Diagnosis not present

## 2017-10-02 DIAGNOSIS — I129 Hypertensive chronic kidney disease with stage 1 through stage 4 chronic kidney disease, or unspecified chronic kidney disease: Secondary | ICD-10-CM | POA: Diagnosis not present

## 2017-10-02 DIAGNOSIS — E7800 Pure hypercholesterolemia, unspecified: Secondary | ICD-10-CM | POA: Diagnosis not present

## 2017-10-02 DIAGNOSIS — Z1389 Encounter for screening for other disorder: Secondary | ICD-10-CM | POA: Diagnosis not present

## 2017-10-02 DIAGNOSIS — Z79899 Other long term (current) drug therapy: Secondary | ICD-10-CM | POA: Diagnosis not present

## 2017-10-02 DIAGNOSIS — Z0000 Encounter for general adult medical examination without abnormal findings: Secondary | ICD-10-CM | POA: Diagnosis not present

## 2017-10-02 DIAGNOSIS — G301 Alzheimer's disease with late onset: Secondary | ICD-10-CM | POA: Diagnosis not present

## 2017-10-08 DIAGNOSIS — N183 Chronic kidney disease, stage 3 (moderate): Secondary | ICD-10-CM | POA: Diagnosis not present

## 2017-10-08 DIAGNOSIS — G301 Alzheimer's disease with late onset: Secondary | ICD-10-CM | POA: Diagnosis not present

## 2017-10-08 DIAGNOSIS — I69398 Other sequelae of cerebral infarction: Secondary | ICD-10-CM | POA: Diagnosis not present

## 2017-10-08 DIAGNOSIS — H5461 Unqualified visual loss, right eye, normal vision left eye: Secondary | ICD-10-CM | POA: Diagnosis not present

## 2017-10-08 DIAGNOSIS — I129 Hypertensive chronic kidney disease with stage 1 through stage 4 chronic kidney disease, or unspecified chronic kidney disease: Secondary | ICD-10-CM | POA: Diagnosis not present

## 2017-10-08 DIAGNOSIS — H538 Other visual disturbances: Secondary | ICD-10-CM | POA: Diagnosis not present

## 2017-10-14 DIAGNOSIS — H353211 Exudative age-related macular degeneration, right eye, with active choroidal neovascularization: Secondary | ICD-10-CM | POA: Diagnosis not present

## 2017-10-22 ENCOUNTER — Ambulatory Visit: Payer: Medicare Other | Admitting: Neurology

## 2017-10-22 ENCOUNTER — Telehealth: Payer: Self-pay

## 2017-10-22 DIAGNOSIS — N183 Chronic kidney disease, stage 3 (moderate): Secondary | ICD-10-CM | POA: Diagnosis not present

## 2017-10-22 DIAGNOSIS — G301 Alzheimer's disease with late onset: Secondary | ICD-10-CM | POA: Diagnosis not present

## 2017-10-22 DIAGNOSIS — I129 Hypertensive chronic kidney disease with stage 1 through stage 4 chronic kidney disease, or unspecified chronic kidney disease: Secondary | ICD-10-CM | POA: Diagnosis not present

## 2017-10-22 DIAGNOSIS — H5461 Unqualified visual loss, right eye, normal vision left eye: Secondary | ICD-10-CM | POA: Diagnosis not present

## 2017-10-22 DIAGNOSIS — H538 Other visual disturbances: Secondary | ICD-10-CM | POA: Diagnosis not present

## 2017-10-22 DIAGNOSIS — I69398 Other sequelae of cerebral infarction: Secondary | ICD-10-CM | POA: Diagnosis not present

## 2017-10-22 NOTE — Telephone Encounter (Signed)
Patient's wife call the same day to cancel appt. She stated per phone staff he is doing better and does not need to be seen.

## 2017-10-23 ENCOUNTER — Encounter: Payer: Self-pay | Admitting: Neurology

## 2017-11-13 DIAGNOSIS — G301 Alzheimer's disease with late onset: Secondary | ICD-10-CM | POA: Diagnosis not present

## 2017-11-13 DIAGNOSIS — N183 Chronic kidney disease, stage 3 (moderate): Secondary | ICD-10-CM | POA: Diagnosis not present

## 2017-11-13 DIAGNOSIS — I129 Hypertensive chronic kidney disease with stage 1 through stage 4 chronic kidney disease, or unspecified chronic kidney disease: Secondary | ICD-10-CM | POA: Diagnosis not present

## 2017-11-22 DIAGNOSIS — H353211 Exudative age-related macular degeneration, right eye, with active choroidal neovascularization: Secondary | ICD-10-CM | POA: Diagnosis not present

## 2017-11-29 DIAGNOSIS — I6782 Cerebral ischemia: Secondary | ICD-10-CM | POA: Diagnosis not present

## 2017-11-29 DIAGNOSIS — I10 Essential (primary) hypertension: Secondary | ICD-10-CM | POA: Diagnosis not present

## 2017-11-29 DIAGNOSIS — G301 Alzheimer's disease with late onset: Secondary | ICD-10-CM | POA: Diagnosis not present

## 2017-11-29 DIAGNOSIS — R1312 Dysphagia, oropharyngeal phase: Secondary | ICD-10-CM | POA: Diagnosis not present

## 2017-12-02 DIAGNOSIS — G301 Alzheimer's disease with late onset: Secondary | ICD-10-CM | POA: Diagnosis not present

## 2017-12-02 DIAGNOSIS — K219 Gastro-esophageal reflux disease without esophagitis: Secondary | ICD-10-CM | POA: Diagnosis not present

## 2017-12-04 DIAGNOSIS — K219 Gastro-esophageal reflux disease without esophagitis: Secondary | ICD-10-CM | POA: Diagnosis not present

## 2017-12-04 DIAGNOSIS — G301 Alzheimer's disease with late onset: Secondary | ICD-10-CM | POA: Diagnosis not present

## 2017-12-06 DIAGNOSIS — K219 Gastro-esophageal reflux disease without esophagitis: Secondary | ICD-10-CM | POA: Diagnosis not present

## 2017-12-06 DIAGNOSIS — G301 Alzheimer's disease with late onset: Secondary | ICD-10-CM | POA: Diagnosis not present

## 2017-12-09 DIAGNOSIS — G301 Alzheimer's disease with late onset: Secondary | ICD-10-CM | POA: Diagnosis not present

## 2017-12-09 DIAGNOSIS — K219 Gastro-esophageal reflux disease without esophagitis: Secondary | ICD-10-CM | POA: Diagnosis not present

## 2017-12-11 DIAGNOSIS — K219 Gastro-esophageal reflux disease without esophagitis: Secondary | ICD-10-CM | POA: Diagnosis not present

## 2017-12-11 DIAGNOSIS — G301 Alzheimer's disease with late onset: Secondary | ICD-10-CM | POA: Diagnosis not present

## 2017-12-13 DIAGNOSIS — K219 Gastro-esophageal reflux disease without esophagitis: Secondary | ICD-10-CM | POA: Diagnosis not present

## 2017-12-13 DIAGNOSIS — G301 Alzheimer's disease with late onset: Secondary | ICD-10-CM | POA: Diagnosis not present

## 2017-12-16 DIAGNOSIS — K219 Gastro-esophageal reflux disease without esophagitis: Secondary | ICD-10-CM | POA: Diagnosis not present

## 2017-12-16 DIAGNOSIS — G301 Alzheimer's disease with late onset: Secondary | ICD-10-CM | POA: Diagnosis not present

## 2017-12-18 DIAGNOSIS — K219 Gastro-esophageal reflux disease without esophagitis: Secondary | ICD-10-CM | POA: Diagnosis not present

## 2017-12-18 DIAGNOSIS — H353211 Exudative age-related macular degeneration, right eye, with active choroidal neovascularization: Secondary | ICD-10-CM | POA: Diagnosis not present

## 2017-12-18 DIAGNOSIS — G301 Alzheimer's disease with late onset: Secondary | ICD-10-CM | POA: Diagnosis not present

## 2017-12-20 DIAGNOSIS — K219 Gastro-esophageal reflux disease without esophagitis: Secondary | ICD-10-CM | POA: Diagnosis not present

## 2017-12-20 DIAGNOSIS — G301 Alzheimer's disease with late onset: Secondary | ICD-10-CM | POA: Diagnosis not present

## 2017-12-23 DIAGNOSIS — K219 Gastro-esophageal reflux disease without esophagitis: Secondary | ICD-10-CM | POA: Diagnosis not present

## 2017-12-23 DIAGNOSIS — G301 Alzheimer's disease with late onset: Secondary | ICD-10-CM | POA: Diagnosis not present

## 2017-12-25 DIAGNOSIS — G301 Alzheimer's disease with late onset: Secondary | ICD-10-CM | POA: Diagnosis not present

## 2017-12-25 DIAGNOSIS — K219 Gastro-esophageal reflux disease without esophagitis: Secondary | ICD-10-CM | POA: Diagnosis not present

## 2017-12-27 DIAGNOSIS — G301 Alzheimer's disease with late onset: Secondary | ICD-10-CM | POA: Diagnosis not present

## 2017-12-27 DIAGNOSIS — K219 Gastro-esophageal reflux disease without esophagitis: Secondary | ICD-10-CM | POA: Diagnosis not present

## 2017-12-31 DIAGNOSIS — G301 Alzheimer's disease with late onset: Secondary | ICD-10-CM | POA: Diagnosis not present

## 2017-12-31 DIAGNOSIS — K219 Gastro-esophageal reflux disease without esophagitis: Secondary | ICD-10-CM | POA: Diagnosis not present

## 2018-01-01 DIAGNOSIS — K219 Gastro-esophageal reflux disease without esophagitis: Secondary | ICD-10-CM | POA: Diagnosis not present

## 2018-01-01 DIAGNOSIS — G301 Alzheimer's disease with late onset: Secondary | ICD-10-CM | POA: Diagnosis not present

## 2018-01-02 DIAGNOSIS — G301 Alzheimer's disease with late onset: Secondary | ICD-10-CM | POA: Diagnosis not present

## 2018-01-02 DIAGNOSIS — K219 Gastro-esophageal reflux disease without esophagitis: Secondary | ICD-10-CM | POA: Diagnosis not present

## 2018-01-06 DIAGNOSIS — K219 Gastro-esophageal reflux disease without esophagitis: Secondary | ICD-10-CM | POA: Diagnosis not present

## 2018-01-06 DIAGNOSIS — G301 Alzheimer's disease with late onset: Secondary | ICD-10-CM | POA: Diagnosis not present

## 2018-01-07 DIAGNOSIS — G301 Alzheimer's disease with late onset: Secondary | ICD-10-CM | POA: Diagnosis not present

## 2018-01-07 DIAGNOSIS — K219 Gastro-esophageal reflux disease without esophagitis: Secondary | ICD-10-CM | POA: Diagnosis not present

## 2018-01-10 DIAGNOSIS — G301 Alzheimer's disease with late onset: Secondary | ICD-10-CM | POA: Diagnosis not present

## 2018-01-10 DIAGNOSIS — K219 Gastro-esophageal reflux disease without esophagitis: Secondary | ICD-10-CM | POA: Diagnosis not present

## 2018-01-10 DIAGNOSIS — R1312 Dysphagia, oropharyngeal phase: Secondary | ICD-10-CM | POA: Diagnosis not present

## 2018-01-10 DIAGNOSIS — I10 Essential (primary) hypertension: Secondary | ICD-10-CM | POA: Diagnosis not present

## 2018-01-14 DIAGNOSIS — K219 Gastro-esophageal reflux disease without esophagitis: Secondary | ICD-10-CM | POA: Diagnosis not present

## 2018-01-14 DIAGNOSIS — G301 Alzheimer's disease with late onset: Secondary | ICD-10-CM | POA: Diagnosis not present

## 2018-01-16 DIAGNOSIS — G301 Alzheimer's disease with late onset: Secondary | ICD-10-CM | POA: Diagnosis not present

## 2018-01-16 DIAGNOSIS — K219 Gastro-esophageal reflux disease without esophagitis: Secondary | ICD-10-CM | POA: Diagnosis not present

## 2018-01-17 DIAGNOSIS — G301 Alzheimer's disease with late onset: Secondary | ICD-10-CM | POA: Diagnosis not present

## 2018-01-17 DIAGNOSIS — K219 Gastro-esophageal reflux disease without esophagitis: Secondary | ICD-10-CM | POA: Diagnosis not present

## 2018-01-20 DIAGNOSIS — G301 Alzheimer's disease with late onset: Secondary | ICD-10-CM | POA: Diagnosis not present

## 2018-01-20 DIAGNOSIS — K219 Gastro-esophageal reflux disease without esophagitis: Secondary | ICD-10-CM | POA: Diagnosis not present

## 2018-01-24 DIAGNOSIS — H353211 Exudative age-related macular degeneration, right eye, with active choroidal neovascularization: Secondary | ICD-10-CM | POA: Diagnosis not present

## 2018-02-04 ENCOUNTER — Telehealth: Payer: Self-pay | Admitting: *Deleted

## 2018-02-04 DIAGNOSIS — R079 Chest pain, unspecified: Secondary | ICD-10-CM | POA: Diagnosis not present

## 2018-02-04 NOTE — Telephone Encounter (Signed)
LM with note below. Scans cancelled

## 2018-02-04 NOTE — Telephone Encounter (Signed)
Radiology scheduling called- states wife is concerned that Jeffrey Simmons cannot swallow the contrast. Rad scheduling offered the water base. Jeffrey Simmons states he has declined since the last time he had a scan. He is not eating food, but is able to drink.

## 2018-02-04 NOTE — Telephone Encounter (Signed)
I recommend cancelling scan and just follow with blood tests only

## 2018-02-07 DIAGNOSIS — I498 Other specified cardiac arrhythmias: Secondary | ICD-10-CM | POA: Diagnosis not present

## 2018-02-07 DIAGNOSIS — G301 Alzheimer's disease with late onset: Secondary | ICD-10-CM | POA: Diagnosis not present

## 2018-02-07 DIAGNOSIS — I25119 Atherosclerotic heart disease of native coronary artery with unspecified angina pectoris: Secondary | ICD-10-CM | POA: Diagnosis not present

## 2018-02-07 DIAGNOSIS — C8310 Mantle cell lymphoma, unspecified site: Secondary | ICD-10-CM | POA: Diagnosis not present

## 2018-02-17 ENCOUNTER — Ambulatory Visit: Payer: Medicare Other | Admitting: Hematology and Oncology

## 2018-02-17 ENCOUNTER — Other Ambulatory Visit: Payer: Medicare Other

## 2018-02-17 ENCOUNTER — Telehealth: Payer: Self-pay | Admitting: *Deleted

## 2018-02-17 NOTE — Telephone Encounter (Signed)
-----   Message from Heath Lark, MD sent at 02/17/2018 10:17 AM EDT ----- Regarding: RE: appointment Contact: [phone] Hi [name],  The patient is frail and has dementia. We typically stop chemo and recommend hospice to follow. He last CT scan showed no disease. After stopping chemo, it might be months to years before his cancer might relapse. Or maybe it might never relapse. I think we need to focus on his needs right now which is palliative care. If family is not willing for him to stop, let [name] knows and we will reschedule an appt in a few weeks  ----- Message ----- From: Gilles Chiquito Sent: 02/17/2018   9:59 AM To: Patton Salles, RN, Heath Lark, MD, # Subject: appointment                                    I did speak with [name] regarding stopping the pill per your request Dr. Alvy Bimler.  However [name] had a question about what would happen afterwards. She did want the nurse to call her to explain and also possibly reschedule.     Thank you

## 2018-02-17 NOTE — Telephone Encounter (Signed)
Notified of message below. Verbalized understanding. Will stop imbruvica. Jeffrey Simmons is going to call the people she has chosen for Palliative Care.

## 2018-03-07 DIAGNOSIS — G301 Alzheimer's disease with late onset: Secondary | ICD-10-CM | POA: Diagnosis not present

## 2018-03-07 DIAGNOSIS — I2510 Atherosclerotic heart disease of native coronary artery without angina pectoris: Secondary | ICD-10-CM | POA: Diagnosis not present

## 2018-03-07 DIAGNOSIS — I10 Essential (primary) hypertension: Secondary | ICD-10-CM | POA: Diagnosis not present

## 2018-03-07 DIAGNOSIS — M7989 Other specified soft tissue disorders: Secondary | ICD-10-CM | POA: Diagnosis not present

## 2018-03-21 DIAGNOSIS — J060 Acute laryngopharyngitis: Secondary | ICD-10-CM | POA: Diagnosis not present

## 2018-03-21 DIAGNOSIS — R591 Generalized enlarged lymph nodes: Secondary | ICD-10-CM | POA: Diagnosis not present

## 2018-03-21 DIAGNOSIS — J028 Acute pharyngitis due to other specified organisms: Secondary | ICD-10-CM | POA: Diagnosis not present

## 2018-04-01 DIAGNOSIS — H353211 Exudative age-related macular degeneration, right eye, with active choroidal neovascularization: Secondary | ICD-10-CM | POA: Diagnosis not present

## 2018-04-20 IMAGING — MR MR MRA HEAD W/O CM
10 of 16 series · 24 of 48 positions shown · IV contrast (multihance)
Comparison: The 08/22/2017 CT of the head.

CLINICAL DATA: 89 y/o M; vision changes in the right eye. History
of mantle cell lymphoma and thrombocytopenia.

EXAM:
MRI HEAD WITHOUT AND WITH CONTRAST
MRA HEAD WITHOUT CONTRAST
MRA NECK WITHOUT AND WITH CONTRAST
TECHNIQUE: Multiplanar, multiecho pulse sequences of the brain and surrounding
structures were obtained without and with intravenous contrast.
Angiographic images of the head were obtained using MRA technique
without contrast. Angiographic images of the neck were obtained
using MRA technique without and with contrast.
CONTRAST:  15mL MULTIHANCE GADOBENATE DIMEGLUMINE 529 MG/ML IV SOLN

[Series 4: DWI · axial · 3.0mm · 0.94mm/px · z∈[+43,+186]mm · 5 of 100 slices shown (1 of 2)]
[im 1/100]
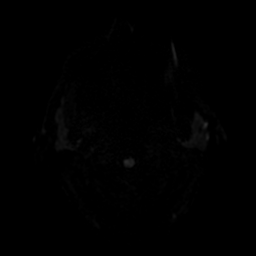
[im 25/100]
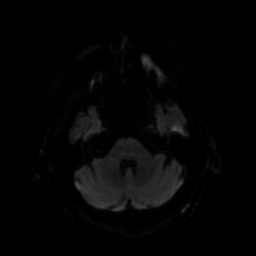
[im 50/100]
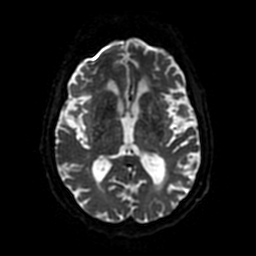
[im 75/100]
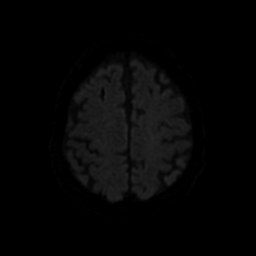
[im 100/100]
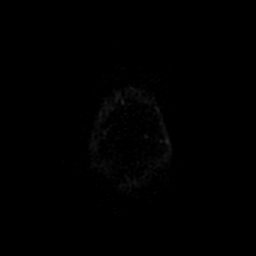

[Series 5: ax (id) 2 · axial · 1.0mm · 0.43mm/px · z∈[+54,+151]mm · 8 of 200 slices shown]
[im 1/200]
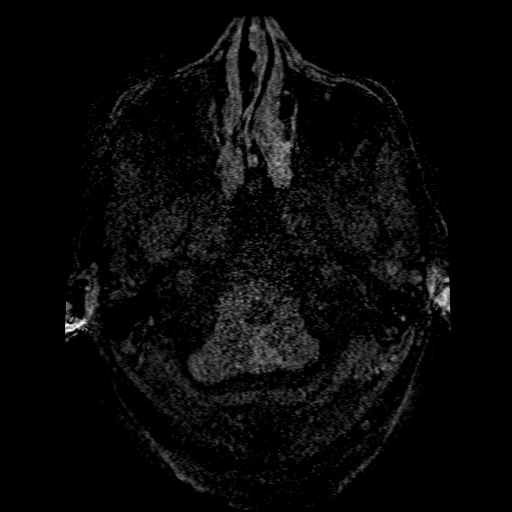
[im 29/200]
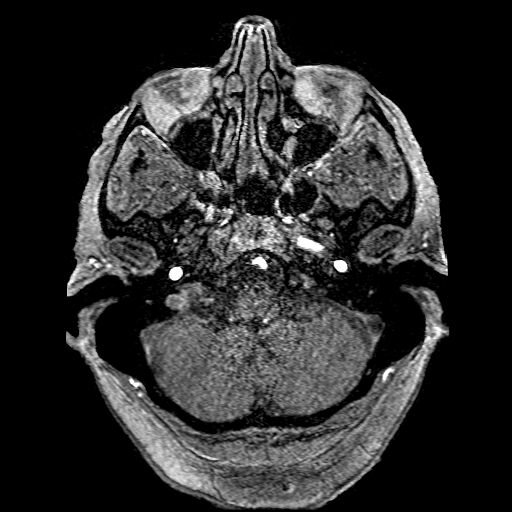
[im 57/200]
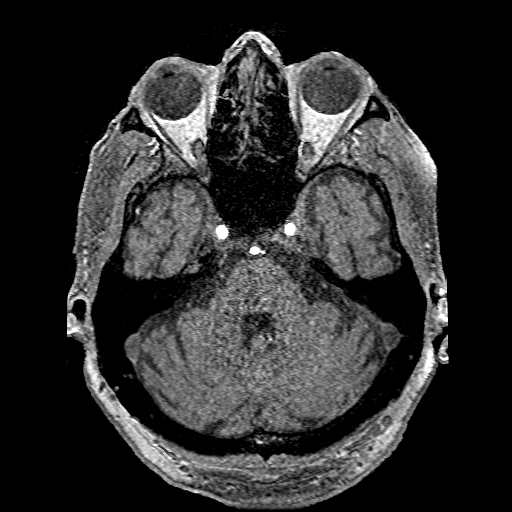
[im 86/200]
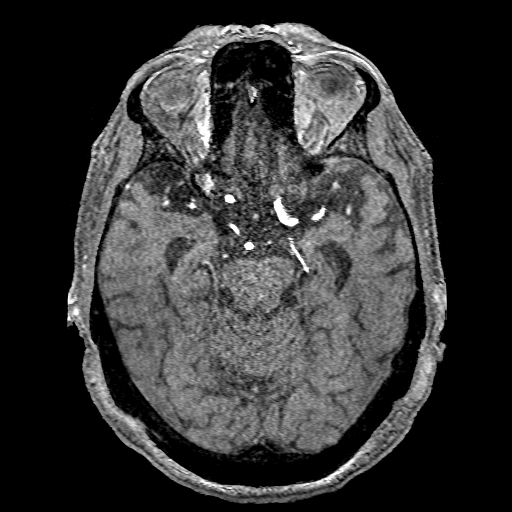
[im 114/200]
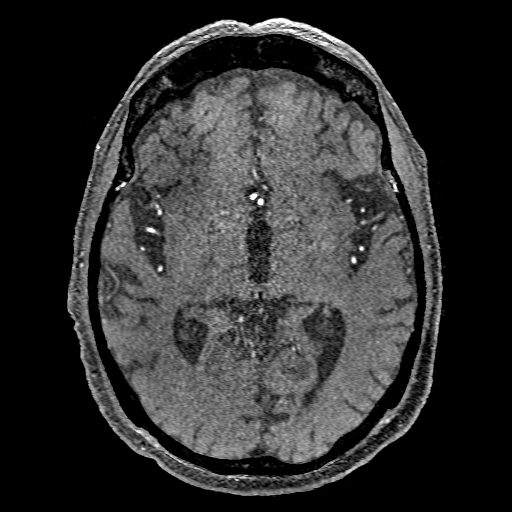
[im 143/200]
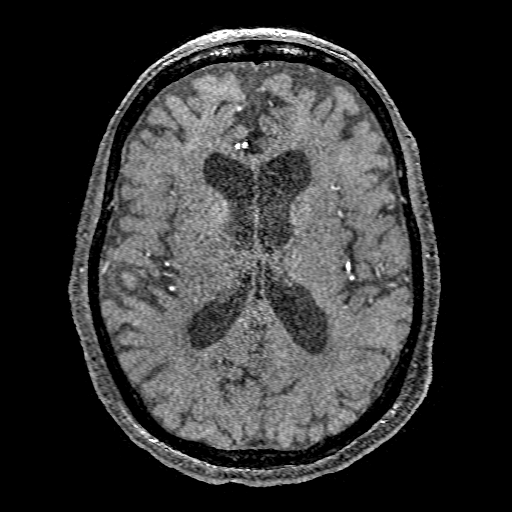
[im 171/200]
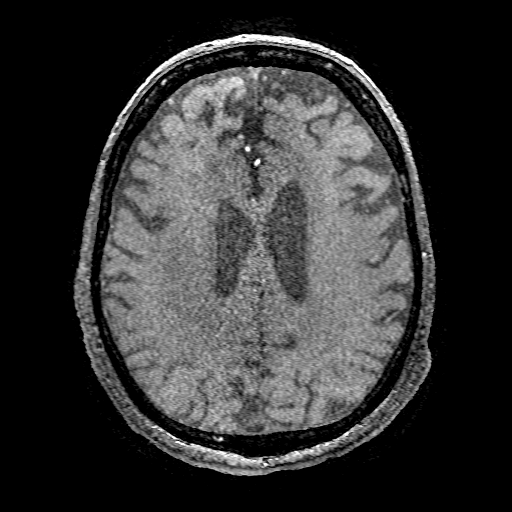
[im 200/200]
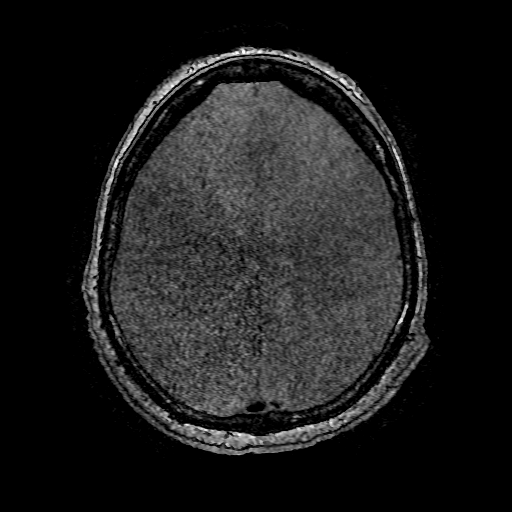

[Series 6: DWI · coronal · 4.0mm · 0.94mm/px · 3 of 70 slices shown (2 of 2)]
[im 1/70]
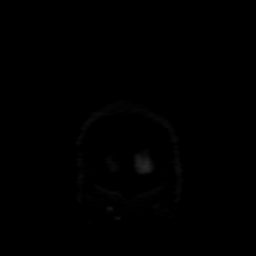
[im 35/70]
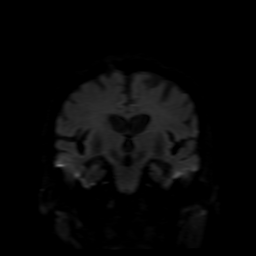
[im 70/70]
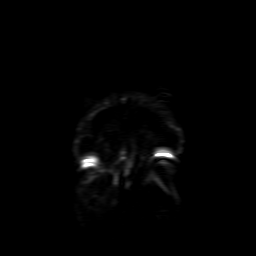

[Series 10: FLAIR · sagittal · 5.0mm · 0.47mm/px · 1 of 24 slices shown (1 of 2)]
[im 1/24]
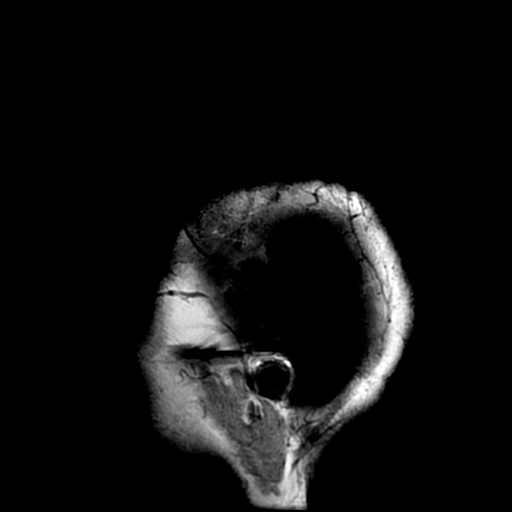

[Series 12: T2 · axial · 5.0mm · 0.43mm/px · 1 of 25 slices shown (1 of 2)]
[im 1/25]
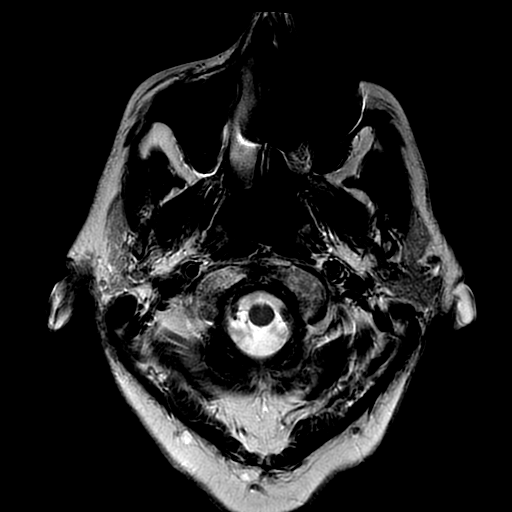

[Series 13: FLAIR · axial · 5.0mm · 0.43mm/px · 1 of 25 slices shown (2 of 2)]
[im 1/25]
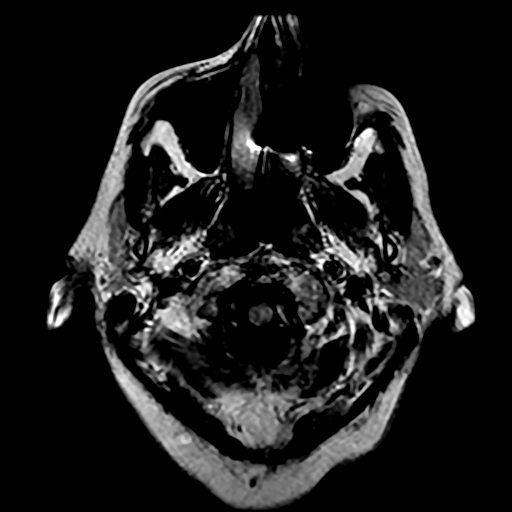

[Series 16: T2 · coronal · 5.0mm · 0.39mm/px · 1 of 30 slices shown (2 of 2)]
[im 1/30]
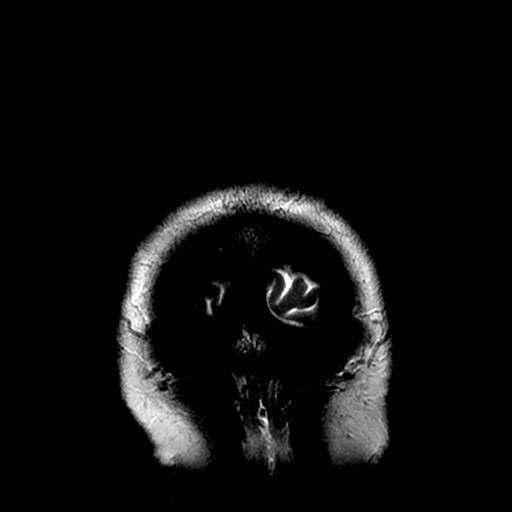

[Series 22: T1 · coronal · 5.0mm · 0.39mm/px · 1 of 30 slices shown]
[im 1/30]
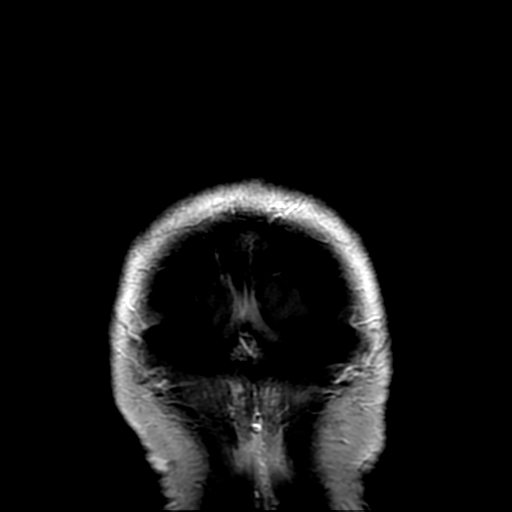

[Series 450: ADC · axial · 3.0mm · 0.94mm/px · z∈[+43,+186]mm · 2 of 50 slices shown (1 of 2)]
[im 1/50]
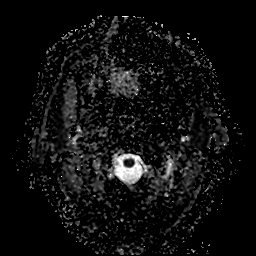
[im 50/50]
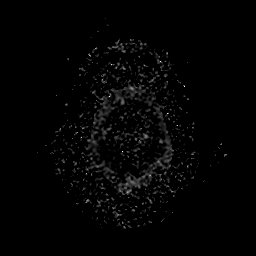

[Series 650: ADC · coronal · 4.0mm · 0.94mm/px · 1 of 35 slices shown (2 of 2)]
[im 1/35]
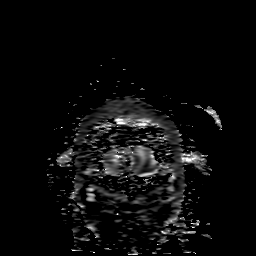

[24 of 48 positions shown; findings below may reference images not displayed]

FINDINGS: MRI HEAD FINDINGS

Brain: Left parietal lobe punctate subcortical focus of reduced
diffusion compatible with small vessel acute/early subacute
infarction (series 4, image 28) no additional diffusion signal
abnormality. Patchy nonspecific foci of T2 FLAIR hyperintense signal
abnormality in subcortical and periventricular white matter are
compatible with moderate chronic microvascular ischemic changes for
age. Moderate brain parenchymal volume loss. No hydrocephalus, focal
mass effect, or extra-axial collection. No effacement of basilar
cisterns. There several scattered punctate foci of susceptibility
hypointensity best appreciated in left posterior temporal lobe,
right periatrial white matter, and the right frontal centrum
semiovale which are compatible with hemosiderin deposition from
prior microhemorrhage. After administration of intravenous contrast
there is no abnormal enhancement of the brain.

Vascular: As below.

Skull and upper cervical spine: Normal marrow signal.

Sinuses/Orbits: Mild diffuse ethmoid sinus mucosal thickening. No
significant abnormal signal of mastoid air cells. Bilateral
intra-ocular lens replacement.

Other: None.

MRA HEAD FINDINGS

Internal carotid arteries: Patent. 3 mm inferiorly directed
outpouching of left paraclinoid ICA (series 5, image 120) which may
represent a small aneurysm or infundibular origin of diminutive
vessel.

Anterior cerebral arteries:  Patent.

Middle cerebral arteries: Patent. Mild right mid M1 and left distal
M1 stenosis.

Anterior communicating artery: Patent.

Posterior communicating arteries: Fetal right PCA. Probable
diminutive left posterior communicating artery.

Posterior cerebral arteries: Patent. Fetal right PCA. Segments of
mild-to-moderate stenosis and bilateral P2 and P3 distributions.

Basilar artery:  Patent.

Vertebral arteries:  Patent.

No additional evidence of high-grade stenosis, large vessel
occlusion, or aneurysm unless noted above.

MRA NECK FINDINGS

Aortic arch: Patent.

Right common carotid artery: Patent. Plaque at the right carotid
bifurcation with mild less than 50% distal common carotid artery
stenosis just proximal to the bifurcation.

Right internal carotid artery: Patent.

Right vertebral artery: Patent.

Left common carotid artery: Patent.

Left Internal carotid artery: Patent.

Left Vertebral artery: Patent.

There is no evidence of hemodynamically significant stenosis by
NASCET criteria, occlusion, or aneurysm.
IMPRESSION: MRI head:

1. Punctate acute/early subacute small vessel infarct within left
parietal subcortical white matter. No acute hemorrhage or mass
effect.
2. Moderate chronic microvascular ischemic changes and moderate
parenchymal volume loss of the brain. Scattered foci of chronic
microhemorrhage in a nonspecific distribution, probably related to
hypertension, less likely amyloid vasculopathy.
3. No abnormal enhancement of the brain.
MRA head:

1. Patent circle of Willis. No large vessel occlusion, aneurysm, or
significant stenosis is identified.
2. Foci of mild stenosis and bilateral M1 and mild-to-moderate
stenosis of bilateral P2/P3 segments.
MRA neck:

1. Patent carotid and vertebral arteries. No hemodynamically
significant stenosis by NASCET criteria, dissection, occlusion, or
aneurysm.
2. Mild atherosclerosis of right carotid bifurcation with mild less
than 50% distal common carotid artery stenosis.

By: Rtoyota Joshjax M.D.

## 2018-05-07 ENCOUNTER — Telehealth: Payer: Self-pay | Admitting: *Deleted

## 2018-05-07 NOTE — Telephone Encounter (Signed)
Wife reports that Jeffrey Simmons has developed lumps on his neck. Is wanting to see Dr Jeffrey Simmons and restart the imbruvica. States he is walking and alert. Has dementia, but carries on with his ADLs. He is doing much better after Speech therapy with eating. Would be able to swallow contrast if CT needed.

## 2018-05-08 ENCOUNTER — Telehealth: Payer: Self-pay | Admitting: Hematology and Oncology

## 2018-05-08 NOTE — Telephone Encounter (Signed)
Spoke to patient regarding upcoming June appointments per 6/13 sch message

## 2018-05-08 NOTE — Telephone Encounter (Signed)
Called wife and told someone will be calling to schedule appt. She verbalized understanding.

## 2018-05-08 NOTE — Telephone Encounter (Signed)
He had stroke last year since I saw him I am not sure resuming chemo is a good idea I will schedule appt to see him first and go from there Will schedule appt

## 2018-05-09 DIAGNOSIS — Z7189 Other specified counseling: Secondary | ICD-10-CM | POA: Diagnosis not present

## 2018-05-09 DIAGNOSIS — C8310 Mantle cell lymphoma, unspecified site: Secondary | ICD-10-CM | POA: Diagnosis not present

## 2018-05-09 DIAGNOSIS — Z515 Encounter for palliative care: Secondary | ICD-10-CM | POA: Diagnosis not present

## 2018-05-09 DIAGNOSIS — G301 Alzheimer's disease with late onset: Secondary | ICD-10-CM | POA: Diagnosis not present

## 2018-05-12 ENCOUNTER — Encounter: Payer: Self-pay | Admitting: Hematology and Oncology

## 2018-05-12 ENCOUNTER — Telehealth: Payer: Self-pay | Admitting: Hematology and Oncology

## 2018-05-12 ENCOUNTER — Inpatient Hospital Stay: Payer: Medicare Other | Attending: Hematology and Oncology | Admitting: Hematology and Oncology

## 2018-05-12 DIAGNOSIS — Z66 Do not resuscitate: Secondary | ICD-10-CM | POA: Diagnosis not present

## 2018-05-12 DIAGNOSIS — C8313 Mantle cell lymphoma, intra-abdominal lymph nodes: Secondary | ICD-10-CM | POA: Diagnosis not present

## 2018-05-12 DIAGNOSIS — Z7189 Other specified counseling: Secondary | ICD-10-CM | POA: Diagnosis not present

## 2018-05-12 DIAGNOSIS — Z8673 Personal history of transient ischemic attack (TIA), and cerebral infarction without residual deficits: Secondary | ICD-10-CM | POA: Diagnosis not present

## 2018-05-12 DIAGNOSIS — F0281 Dementia in other diseases classified elsewhere with behavioral disturbance: Secondary | ICD-10-CM | POA: Diagnosis not present

## 2018-05-12 DIAGNOSIS — G301 Alzheimer's disease with late onset: Secondary | ICD-10-CM | POA: Diagnosis not present

## 2018-05-12 DIAGNOSIS — F02818 Dementia in other diseases classified elsewhere, unspecified severity, with other behavioral disturbance: Secondary | ICD-10-CM

## 2018-05-12 NOTE — Assessment & Plan Note (Signed)
I have reviewed the plan of care with the patient's wife He has palpable lymphadenopathy in his neck, worrisome for lymphoma recurrence Lymphoma work-up with consistent with blood work, CT imaging and possibility of repeat lymph node biopsy Given his medical comorbidities including moderate to severe dementia, I do not believe this would be beneficial for the patient Treatment could potentially be harmful to the patient I have extensive discussion about goals of care with the wife She is in agreement not to pursue further testing I plan to see him back again in 2 months for assessment and to determine prognosis based on clinical examination, to assess disease progression

## 2018-05-12 NOTE — Telephone Encounter (Signed)
Gave patient avs and calendar of upcoming august appointments. °

## 2018-05-12 NOTE — Assessment & Plan Note (Signed)
We discussed importance of Advanced Directives and Living will. The patient has DNR order at home His wife does not want him to be readmitted to the hospital for any reasons I recommend enrollment to palliative care team

## 2018-05-12 NOTE — Assessment & Plan Note (Signed)
The patient has significant dementia He is dependent on caregivers for most activities of daily living I do not believe it is wise to pursue aggressive evaluation at this point and his wife agrees

## 2018-05-12 NOTE — Progress Notes (Signed)
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 ([place]) I have reviewed the plan of care with the patient's wife He has palpable lymphadenopathy in his neck, worrisome for lymphoma recurrence Lymphoma work-up with consistent with blood work, CT imaging and possibility of repeat lymph node biopsy Given his medical comorbidities including moderate to severe dementia, I do not believe this would be beneficial for the patient Treatment could potentially be harmful to the patient I have extensive discussion about goals of care with the wife She is in agreement not to pursue further testing I plan to see him back again in 2 months for assessment and to determine prognosis based on clinical examination, to assess disease progression  Late onset Alzheimer's disease with behavioral disturbance The patient has significant dementia He is dependent on caregivers for most activities of daily living I do not believe it is wise to pursue aggressive evaluation at this point and his wife agrees  Goals of care, counseling/discussion We discussed importance of Advanced Directives and Living will. The patient has DNR order at home His wife does not want him to be readmitted to the hospital for any reasons I recommend enrollment to palliative care team    No orders of the defined types were placed in this encounter.   INTERVAL HISTORY: Please see below for problem oriented charting. He returns with his wife History from the patient is not possible due to his dementia although he answers simple questions and is able to follow simple commands His wife noted new lymphadenopathy recently on his neck Ibrutinib was discontinued in March due to  swallowing difficulties and recent stroke There were no reported bleeding since discontinuation of ibrutinib At baseline, the patient is able to feed himself and shower He is dependent on family members and caregivers for all activities of daily living He wears adult diapers due to mild urinary leak There was no recent infection There were no reported falls No recent new neurological deficit from stroke He denies pain His wife noted mild weight loss  SUMMARY OF ONCOLOGIC HISTORY:   Mantle cell lymphoma of intra-abdominal lymph nodes ([place])   03/04/2012 Initial Diagnosis    Mantle cell lymphoma of intra-abdominal lymph nodes      03/18/2015 - 03/21/2015 Hospital Admission    He was admitted to the hospital after he was found to have a fall. Chemotherapy was put on hold.      06/08/2015 Imaging    Repeat CT scan showed complete remission      02/08/2016 Imaging    CT imaging showed complete response/remission      02/13/2017 Imaging    Stable CT appearance of the chest, abdomen and pelvis. No findings for recurrent lymphoma. Stable atherosclerotic calcifications involving the thoracic and abdominal aorta and branch vessels but no aneurysm or dissection. Advanced sigmoid diverticulosis but no findings for diverticulitis. Stable hepatic and left renal cysts       REVIEW OF SYSTEMS:  Unreliable, based on his wife as above Constitutional: Denies fevers, chills or abnormal weight loss Eyes: Denies blurriness of vision Ears, nose, mouth, throat, and face: Denies mucositis or sore throat Respiratory: Denies cough, dyspnea or wheezes Cardiovascular: Denies palpitation, chest discomfort or lower extremity swelling Gastrointestinal:  Denies nausea, heartburn or change in bowel  habits Skin: Denies abnormal skin rashes Neurological:Denies numbness, tingling or new weaknesses Behavioral/Psych: Mood is stable, no new changes  All other systems were reviewed with the patient's and are  negative.  I have reviewed the past medical history, past surgical history, social history and family history with the patient and they are unchanged from previous note.  ALLERGIES:  is allergic to aricept [donepezil hcl] and zosyn [piperacillin sod-tazobactam so].  MEDICATIONS:  Current Outpatient Medications  Medication Sig Dispense Refill  . amLODipine (NORVASC) 2.5 MG tablet Take 1 tablet (2.5 mg total) by mouth daily. 30 tablet 0  . aspirin 325 MG tablet Take 1 tablet (325 mg total) by mouth daily. 30 tablet 0  . isosorbide mononitrate (IMDUR) 30 MG 24 hr tablet Take 30 mg by mouth daily.  1  . Multiple Vitamins-Minerals (EYE-VITE PLUS LUTEIN) CAPS Take 1 capsule by mouth 2 (two) times daily.    Marland Kitchen NAMENDA XR 28 MG CP24 24 hr capsule Take 28 mg by mouth daily.     . ranitidine (ZANTAC) 75 MG tablet Take 75 mg by mouth 2 (two) times daily. 2 tablets before am and evening meds     No current facility-administered medications for this visit.     PHYSICAL EXAMINATION: ECOG PERFORMANCE STATUS: 2 - Symptomatic, <50% confined to bed  Vitals:   05/12/18 1007  BP: (!) 121/46  Pulse: (!) 58  Resp: 18  Temp: (!) 97.5 F (36.4 C)  SpO2: 99%   Filed Weights   05/12/18 1007  Weight: 160 lb 3.2 oz (72.7 kg)    GENERAL:alert, no distress and comfortable SKIN: skin color, texture, turgor are normal, no rashes or significant lesions EYES: normal, Conjunctiva are pink and non-injected, sclera clear OROPHARYNX:no exudate, no erythema and lips, buccal mucosa, and tongue normal  NECK: supple, thyroid normal size, non-tender, without nodularity LYMPH: Palpable lymphadenopathy on both sides of the neck LUNGS: clear to auscultation and percussion with normal breathing effort HEART: regular rate & rhythm and no murmurs and no lower extremity edema ABDOMEN:abdomen soft, non-tender and normal bowel sounds Musculoskeletal:no cyanosis of digits and no clubbing  NEURO: alert no focal motor/sensory  deficits  LABORATORY DATA:  I have reviewed the data as listed    Component Value Date/Time   NA 139 08/23/2017 0427   NA 140 08/19/2017 1030   K 3.9 08/23/2017 0427   K 4.6 08/19/2017 1030   CL 108 08/23/2017 0427   CL 103 02/24/2013 0923   CO2 23 08/23/2017 0427   CO2 27 08/19/2017 1030   GLUCOSE 94 08/23/2017 0427   GLUCOSE 78 08/19/2017 1030   GLUCOSE 98 02/24/2013 0923   BUN 18 08/23/2017 0427   BUN 20.9 08/19/2017 1030   CREATININE 1.28 (H) 08/23/2017 1520   CREATININE 1.4 (H) 08/19/2017 1030   CALCIUM 8.5 (L) 08/23/2017 0427   CALCIUM 9.1 08/19/2017 1030   PROT 5.2 (L) 08/23/2017 0427   PROT 5.9 (L) 08/19/2017 1030   ALBUMIN 3.5 08/23/2017 0427   ALBUMIN 3.6 08/19/2017 1030   AST 19 08/23/2017 0427   AST 16 08/19/2017 1030   ALT 13 (L) 08/23/2017 0427   ALT 10 08/19/2017 1030   ALKPHOS 70 08/23/2017 0427   ALKPHOS 89 08/19/2017 1030   BILITOT 1.2 08/23/2017 0427   BILITOT 1.19 08/19/2017 1030   GFRNONAA 48 (L) 08/23/2017 1520   GFRAA 56 (L) 08/23/2017 1520    No results found for: SPEP, UPEP  Lab Results  Component Value Date  WBC 5.8 08/23/2017   NEUTROABS 3.8 08/22/2017   HGB 13.9 08/23/2017   HCT 40.9 08/23/2017   MCV 91.7 08/23/2017   PLT 86 (L) 08/23/2017      Chemistry      Component Value Date/Time   NA 139 08/23/2017 0427   NA 140 08/19/2017 1030   K 3.9 08/23/2017 0427   K 4.6 08/19/2017 1030   CL 108 08/23/2017 0427   CL 103 02/24/2013 0923   CO2 23 08/23/2017 0427   CO2 27 08/19/2017 1030   BUN 18 08/23/2017 0427   BUN 20.9 08/19/2017 1030   CREATININE 1.28 (H) 08/23/2017 1520   CREATININE 1.4 (H) 08/19/2017 1030      Component Value Date/Time   CALCIUM 8.5 (L) 08/23/2017 0427   CALCIUM 9.1 08/19/2017 1030   ALKPHOS 70 08/23/2017 0427   ALKPHOS 89 08/19/2017 1030   AST 19 08/23/2017 0427   AST 16 08/19/2017 1030   ALT 13 (L) 08/23/2017 0427   ALT 10 08/19/2017 1030   BILITOT 1.2 08/23/2017 0427   BILITOT 1.19 08/19/2017  1030      All questions were answered. The patient knows to call the clinic with any problems, questions or concerns. No barriers to learning was detected.  I spent 25 minutes counseling the patient face to face. The total time spent in the appointment was 30 minutes and more than 50% was on counseling and review of test results  Heath Lark, MD 05/12/2018 11:21 AM

## 2018-05-23 DIAGNOSIS — H35372 Puckering of macula, left eye: Secondary | ICD-10-CM | POA: Diagnosis not present

## 2018-05-23 DIAGNOSIS — H35453 Secondary pigmentary degeneration, bilateral: Secondary | ICD-10-CM | POA: Diagnosis not present

## 2018-05-23 DIAGNOSIS — H31091 Other chorioretinal scars, right eye: Secondary | ICD-10-CM | POA: Diagnosis not present

## 2018-05-23 DIAGNOSIS — H353211 Exudative age-related macular degeneration, right eye, with active choroidal neovascularization: Secondary | ICD-10-CM | POA: Diagnosis not present

## 2018-05-23 DIAGNOSIS — H353122 Nonexudative age-related macular degeneration, left eye, intermediate dry stage: Secondary | ICD-10-CM | POA: Diagnosis not present

## 2018-07-04 DIAGNOSIS — G301 Alzheimer's disease with late onset: Secondary | ICD-10-CM | POA: Diagnosis not present

## 2018-07-04 DIAGNOSIS — R591 Generalized enlarged lymph nodes: Secondary | ICD-10-CM | POA: Diagnosis not present

## 2018-07-04 DIAGNOSIS — R110 Nausea: Secondary | ICD-10-CM | POA: Diagnosis not present

## 2018-07-14 ENCOUNTER — Inpatient Hospital Stay: Payer: Medicare Other | Attending: Hematology and Oncology | Admitting: Hematology and Oncology

## 2018-07-14 ENCOUNTER — Telehealth: Payer: Self-pay

## 2018-07-14 NOTE — Telephone Encounter (Signed)
Called and spoke with wife regarding pateint no show today. Wife states that from a previous visit with Dr. Alvy Bimler they talked about not needing to schedule appt. Wife states that she did not realize Jeffrey Simmons had appt today. She thought she had canceled the appt. They do not need to reschedule appt.

## 2018-08-22 DIAGNOSIS — I10 Essential (primary) hypertension: Secondary | ICD-10-CM | POA: Diagnosis not present

## 2018-08-22 DIAGNOSIS — S51011A Laceration without foreign body of right elbow, initial encounter: Secondary | ICD-10-CM | POA: Diagnosis not present

## 2018-08-22 DIAGNOSIS — R591 Generalized enlarged lymph nodes: Secondary | ICD-10-CM | POA: Diagnosis not present

## 2018-08-22 DIAGNOSIS — C8310 Mantle cell lymphoma, unspecified site: Secondary | ICD-10-CM | POA: Diagnosis not present

## 2018-09-03 DIAGNOSIS — Z23 Encounter for immunization: Secondary | ICD-10-CM | POA: Diagnosis not present

## 2018-10-07 DIAGNOSIS — G309 Alzheimer's disease, unspecified: Secondary | ICD-10-CM | POA: Diagnosis not present

## 2018-10-07 DIAGNOSIS — I10 Essential (primary) hypertension: Secondary | ICD-10-CM | POA: Diagnosis not present

## 2018-10-10 DIAGNOSIS — Z79899 Other long term (current) drug therapy: Secondary | ICD-10-CM | POA: Diagnosis not present

## 2018-10-14 DIAGNOSIS — G309 Alzheimer's disease, unspecified: Secondary | ICD-10-CM | POA: Diagnosis not present

## 2018-10-16 DIAGNOSIS — Z79899 Other long term (current) drug therapy: Secondary | ICD-10-CM | POA: Diagnosis not present

## 2018-10-20 DIAGNOSIS — M25512 Pain in left shoulder: Secondary | ICD-10-CM | POA: Diagnosis not present

## 2018-10-21 DIAGNOSIS — E039 Hypothyroidism, unspecified: Secondary | ICD-10-CM | POA: Diagnosis not present

## 2018-10-21 DIAGNOSIS — N390 Urinary tract infection, site not specified: Secondary | ICD-10-CM | POA: Diagnosis not present

## 2018-10-21 DIAGNOSIS — G309 Alzheimer's disease, unspecified: Secondary | ICD-10-CM | POA: Diagnosis not present

## 2018-10-28 DIAGNOSIS — G309 Alzheimer's disease, unspecified: Secondary | ICD-10-CM | POA: Diagnosis not present

## 2018-11-11 DIAGNOSIS — N390 Urinary tract infection, site not specified: Secondary | ICD-10-CM | POA: Diagnosis not present

## 2018-11-18 DIAGNOSIS — F0281 Dementia in other diseases classified elsewhere with behavioral disturbance: Secondary | ICD-10-CM | POA: Diagnosis not present

## 2018-11-25 ENCOUNTER — Telehealth: Payer: Self-pay | Admitting: *Deleted

## 2018-11-25 DIAGNOSIS — F0281 Dementia in other diseases classified elsewhere with behavioral disturbance: Secondary | ICD-10-CM | POA: Diagnosis not present

## 2018-11-25 DIAGNOSIS — R1312 Dysphagia, oropharyngeal phase: Secondary | ICD-10-CM | POA: Diagnosis not present

## 2018-11-25 NOTE — Telephone Encounter (Signed)
Telephone call returned to [facility], Juliann Pulse. She confirms appointments on 12/02/18 for 10:15 arrival, Lab and Dr. Alvy Bimler.

## 2018-11-25 NOTE — Telephone Encounter (Signed)
Jeffrey Simmons from [facility] has called requesting patient and family are seeking a work up. He has increased back pain, new "lumps" and he has stopped eating on a regular basis. They would like an appointment with Dr. Alvy Bimler for workup/evaluation.   [place] [phone]

## 2018-12-01 ENCOUNTER — Other Ambulatory Visit: Payer: Self-pay

## 2018-12-01 DIAGNOSIS — C8313 Mantle cell lymphoma, intra-abdominal lymph nodes: Secondary | ICD-10-CM

## 2018-12-02 ENCOUNTER — Inpatient Hospital Stay: Payer: Medicare Other

## 2018-12-02 ENCOUNTER — Inpatient Hospital Stay: Payer: Medicare Other | Attending: Hematology and Oncology | Admitting: Hematology and Oncology

## 2018-12-02 ENCOUNTER — Telehealth: Payer: Self-pay | Admitting: Hematology and Oncology

## 2018-12-02 ENCOUNTER — Telehealth: Payer: Self-pay

## 2018-12-02 ENCOUNTER — Encounter: Payer: Self-pay | Admitting: Hematology and Oncology

## 2018-12-02 DIAGNOSIS — D61818 Other pancytopenia: Secondary | ICD-10-CM | POA: Insufficient documentation

## 2018-12-02 DIAGNOSIS — R64 Cachexia: Secondary | ICD-10-CM | POA: Diagnosis not present

## 2018-12-02 DIAGNOSIS — Z66 Do not resuscitate: Secondary | ICD-10-CM | POA: Diagnosis not present

## 2018-12-02 DIAGNOSIS — G893 Neoplasm related pain (acute) (chronic): Secondary | ICD-10-CM | POA: Insufficient documentation

## 2018-12-02 DIAGNOSIS — C8313 Mantle cell lymphoma, intra-abdominal lymph nodes: Secondary | ICD-10-CM | POA: Diagnosis present

## 2018-12-02 DIAGNOSIS — G301 Alzheimer's disease with late onset: Secondary | ICD-10-CM | POA: Insufficient documentation

## 2018-12-02 DIAGNOSIS — N185 Chronic kidney disease, stage 5: Secondary | ICD-10-CM | POA: Diagnosis not present

## 2018-12-02 DIAGNOSIS — F0281 Dementia in other diseases classified elsewhere with behavioral disturbance: Secondary | ICD-10-CM

## 2018-12-02 DIAGNOSIS — Z7189 Other specified counseling: Secondary | ICD-10-CM

## 2018-12-02 LAB — CBC WITH DIFFERENTIAL (CANCER CENTER ONLY)
Abs Immature Granulocytes: 0.05 10*3/uL (ref 0.00–0.07)
BASOS PCT: 1 %
Basophils Absolute: 0 10*3/uL (ref 0.0–0.1)
Eosinophils Absolute: 0.2 10*3/uL (ref 0.0–0.5)
Eosinophils Relative: 3 %
HCT: 36.9 % — ABNORMAL LOW (ref 39.0–52.0)
HEMOGLOBIN: 11.7 g/dL — AB (ref 13.0–17.0)
Immature Granulocytes: 1 %
Lymphocytes Relative: 23 %
Lymphs Abs: 1.9 10*3/uL (ref 0.7–4.0)
MCH: 31 pg (ref 26.0–34.0)
MCHC: 31.7 g/dL (ref 30.0–36.0)
MCV: 97.6 fL (ref 80.0–100.0)
MONO ABS: 0.4 10*3/uL (ref 0.1–1.0)
Monocytes Relative: 5 %
Neutro Abs: 5.7 10*3/uL (ref 1.7–7.7)
Neutrophils Relative %: 67 %
Platelet Count: 74 10*3/uL — ABNORMAL LOW (ref 150–400)
RBC: 3.78 MIL/uL — AB (ref 4.22–5.81)
RDW: 15.9 % — ABNORMAL HIGH (ref 11.5–15.5)
WBC: 8.3 10*3/uL (ref 4.0–10.5)
nRBC: 0 % (ref 0.0–0.2)

## 2018-12-02 LAB — CMP (CANCER CENTER ONLY)
ALT: 17 U/L (ref 0–44)
AST: 20 U/L (ref 15–41)
Albumin: 3.4 g/dL — ABNORMAL LOW (ref 3.5–5.0)
Alkaline Phosphatase: 159 U/L — ABNORMAL HIGH (ref 38–126)
Anion gap: 12 (ref 5–15)
BILIRUBIN TOTAL: 0.8 mg/dL (ref 0.3–1.2)
BUN: 84 mg/dL — AB (ref 8–23)
CO2: 23 mmol/L (ref 22–32)
CREATININE: 4.95 mg/dL — AB (ref 0.61–1.24)
Calcium: 8.8 mg/dL — ABNORMAL LOW (ref 8.9–10.3)
Chloride: 114 mmol/L — ABNORMAL HIGH (ref 98–111)
GFR, EST NON AFRICAN AMERICAN: 10 mL/min — AB (ref >60)
GFR, Est AFR Am: 11 mL/min — ABNORMAL LOW (ref >60)
Glucose, Bld: 103 mg/dL — ABNORMAL HIGH (ref 70–99)
Potassium: 5.2 mmol/L — ABNORMAL HIGH (ref 3.5–5.1)
Sodium: 149 mmol/L — ABNORMAL HIGH (ref 135–145)
TOTAL PROTEIN: 5.8 g/dL — AB (ref 6.5–8.1)

## 2018-12-02 MED ORDER — MORPHINE SULFATE (CONCENTRATE) 10 MG/0.5ML PO SOLN: 10.0000 mg | ORAL | 0 refills | Status: AC | PRN

## 2018-12-02 MED ORDER — MIRTAZAPINE 15 MG PO TBDP: 15.0000 mg | ORAL_TABLET | Freq: Every day | ORAL | 11 refills | Status: AC

## 2018-12-02 NOTE — Assessment & Plan Note (Signed)
He has signs of worsening Alzheimer's disease with agitation He will continue medical management For that reason, I do not recommend treatment

## 2018-12-02 NOTE — Assessment & Plan Note (Signed)
He has progressive, acquired pancytopenia due to cancer relapse I recommend discontinuation of aspirin therapy due to risk of bleeding with progressive thrombocytopenia.

## 2018-12-02 NOTE — Assessment & Plan Note (Signed)
He has significant progressive weight loss due to his recurrence of cancer I recommend a trial of mirtazapine

## 2018-12-02 NOTE — Telephone Encounter (Signed)
No new orders

## 2018-12-02 NOTE — Assessment & Plan Note (Signed)
Based on my clinical findings, palpable lymphadenopathy and progressive pancytopenia, there is no doubt that the patient has recurrence of lymphoma However, given his advanced dementia and poor performance status, he is not a candidate for palliative chemotherapy I do not recommend staging imaging studies or biopsy of lymph node or bone marrow to assess We discussed the risk and benefits of prednisone therapy and at this point in time, I do not believe the prednisone will bring meaningful prolongation of life I recommend referral to palliative care/hospice only and his wife agreed

## 2018-12-02 NOTE — Assessment & Plan Note (Signed)
The patient show signs of pain when he tries to swallow It could be related to his disease I recommend a trial of low-dose morphine concentrate for pain control I recommend palliative care/hospice referral for chronic pain management

## 2018-12-02 NOTE — Assessment & Plan Note (Signed)
Repeat blood test showed signs of stage V kidney failure, progressed from his baseline of stage III kidney disease This is likely due to disease progression He has mild hyperkalemia I discussed this with his wife and she agreed not to pursue hemodialysis

## 2018-12-02 NOTE — Progress Notes (Signed)
[facility] OFFICE PROGRESS NOTE  Patient Care Team: Irine Seal, MD as Consulting Physician (Urology)  ASSESSMENT & PLAN:  Mantle cell lymphoma of intra-abdominal lymph nodes ([place]) Based on my clinical findings, palpable lymphadenopathy and progressive pancytopenia, there is no doubt that the patient has recurrence of lymphoma However, given his advanced dementia and poor performance status, he is not a candidate for palliative chemotherapy I do not recommend staging imaging studies or biopsy of lymph node or bone marrow to assess We discussed the risk and benefits of prednisone therapy and at this point in time, I do not believe the prednisone will bring meaningful prolongation of life I recommend referral to palliative care/hospice only and his wife agreed  Pancytopenia, acquired ([place]) He has progressive, acquired pancytopenia due to cancer relapse I recommend discontinuation of aspirin therapy due to risk of bleeding with progressive thrombocytopenia.  Chronic kidney disease, stage V (very severe) ([facility]) Repeat blood test showed signs of stage V kidney failure, progressed from his baseline of stage III kidney disease This is likely due to disease progression He has mild hyperkalemia I discussed this with his wife and she agreed not to pursue hemodialysis  Late onset Alzheimer's disease with behavioral disturbance He has signs of worsening Alzheimer's disease with agitation He will continue medical management For that reason, I do not recommend treatment  Cancer associated pain The patient show signs of pain when he tries to swallow It could be related to his disease I recommend a trial of low-dose morphine concentrate for pain control I recommend palliative care/hospice referral for chronic pain management  Malignant cachexia ([place]) He has significant progressive weight loss due to his recurrence of cancer I recommend a trial of mirtazapine  Goals of care,  counseling/discussion I have a long discussion with his wife as well as his case manager I recommend not to pursue further imaging study or biopsy or treatment for him We discussed prognosis In general, without treatment, his prognosis is less than 6 months In his situation, with significant signs of renal failure, estimated his prognosis to be less than 30 days The patient has advanced directive and his CODE STATUS is DNR His wife does not want him to pursue hemodialysis or feeding tube as life-sustaining measures   No orders of the defined types were placed in this encounter.   INTERVAL HISTORY: Please see below for problem oriented charting. He was seen back again for urgent evaluation due to progressive weight loss, lymphadenopathy, difficulties with swallowing, on background history of lymphoma His wife and a caseworker is present Since last time I saw him, he has progressive weight loss with signs or symptoms of pain The patient does not communicate well due to his background history of Alzheimer's disease His caregivers had noted evidence of progressive lymphadenopathy in his neck The patient appears to be weak with poor appetite He appears to have difficulties with swallowing  SUMMARY OF ONCOLOGIC HISTORY:   Mantle cell lymphoma of intra-abdominal lymph nodes ([place])   03/04/2012 Initial Diagnosis    Mantle cell lymphoma of intra-abdominal lymph nodes    03/18/2015 - 03/21/2015 Hospital Admission    He was admitted to the hospital after he was found to have a fall. Chemotherapy was put on hold.    06/08/2015 Imaging    Repeat CT scan showed complete remission    02/08/2016 Imaging    CT imaging showed complete response/remission    02/13/2017 Imaging    Stable CT appearance of the  chest, abdomen and pelvis. No findings for recurrent lymphoma. Stable atherosclerotic calcifications involving the thoracic and abdominal aorta and branch vessels but no aneurysm or dissection.  Advanced sigmoid diverticulosis but no findings for diverticulitis. Stable hepatic and left renal cysts     REVIEW OF SYSTEMS: Unable to assess due to his Alzheimer's disease  I have reviewed the past medical history, past surgical history, social history and family history with the patient and they are unchanged from previous note.  ALLERGIES:  is allergic to aricept [donepezil hcl] and zosyn [piperacillin sod-tazobactam so].  MEDICATIONS:  Current Outpatient Medications  Medication Sig Dispense Refill  . amLODipine (NORVASC) 2.5 MG tablet Take 1 tablet (2.5 mg total) by mouth daily. 30 tablet 0  . isosorbide mononitrate (IMDUR) 30 MG 24 hr tablet Take 30 mg by mouth daily.  1  . mirtazapine (REMERON SOL-TAB) 15 MG disintegrating tablet Take 1 tablet (15 mg total) by mouth at bedtime. 30 tablet 11  . Morphine Sulfate (MORPHINE CONCENTRATE) 10 MG/0.5ML SOLN concentrated solution Place 0.5 mLs (10 mg total) under the tongue every 2 (two) hours as needed. 240 mL 0  . Multiple Vitamins-Minerals (EYE-VITE PLUS LUTEIN) CAPS Take 1 capsule by mouth 2 (two) times daily.    Marland Kitchen NAMENDA XR 28 MG CP24 24 hr capsule Take 28 mg by mouth daily.     . ranitidine (ZANTAC) 75 MG tablet Take 75 mg by mouth 2 (two) times daily. 2 tablets before am and evening meds     No current facility-administered medications for this visit.     PHYSICAL EXAMINATION: ECOG PERFORMANCE STATUS: 2 - Symptomatic, <50% confined to bed  Vitals:   12/02/18 1040  BP: 109/81  Pulse: 70  Resp: 18  SpO2: 100%   Filed Weights   12/02/18 1040  Weight: 136 lb 3.2 oz (61.8 kg)    GENERAL:alert, he was examined on his wheelchair.  He looks thinner than his previous visit SKIN: skin color, texture, turgor are normal, no rashes or significant lesions EYES: normal, Conjunctiva are pink and non-injected, sclera clear OROPHARYNX:no exudate, no erythema and lips, buccal mucosa, and tongue normal  NECK: supple, thyroid normal  size, non-tender, without nodularity LYMPH: He has palpable lymphadenopathy in his neck LUNGS: clear to auscultation and percussion with normal breathing effort HEART: regular rate & rhythm and no murmurs and no lower extremity edema ABDOMEN:abdomen soft, non-tender and normal bowel sounds Musculoskeletal:no cyanosis of digits and no clubbing  NEURO: alert but not fluent speech  LABORATORY DATA:  I have reviewed the data as listed    Component Value Date/Time   NA 149 (H) 12/02/2018 1003   NA 140 08/19/2017 1030   K 5.2 (H) 12/02/2018 1003   K 4.6 08/19/2017 1030   CL 114 (H) 12/02/2018 1003   CL 103 02/24/2013 0923   CO2 23 12/02/2018 1003   CO2 27 08/19/2017 1030   GLUCOSE 103 (H) 12/02/2018 1003   GLUCOSE 78 08/19/2017 1030   GLUCOSE 98 02/24/2013 0923   BUN 84 (H) 12/02/2018 1003   BUN 20.9 08/19/2017 1030   CREATININE 4.95 (HH) 12/02/2018 1003   CREATININE 1.4 (H) 08/19/2017 1030   CALCIUM 8.8 (L) 12/02/2018 1003   CALCIUM 9.1 08/19/2017 1030   PROT 5.8 (L) 12/02/2018 1003   PROT 5.9 (L) 08/19/2017 1030   ALBUMIN 3.4 (L) 12/02/2018 1003   ALBUMIN 3.6 08/19/2017 1030   AST 20 12/02/2018 1003   AST 16 08/19/2017 1030   ALT 17 12/02/2018  1003   ALT 10 08/19/2017 1030   ALKPHOS 159 (H) 12/02/2018 1003   ALKPHOS 89 08/19/2017 1030   BILITOT 0.8 12/02/2018 1003   BILITOT 1.19 08/19/2017 1030   GFRNONAA 10 (L) 12/02/2018 1003   GFRAA 11 (L) 12/02/2018 1003    No results found for: SPEP, UPEP  Lab Results  Component Value Date   WBC 8.3 12/02/2018   NEUTROABS 5.7 12/02/2018   HGB 11.7 (L) 12/02/2018   HCT 36.9 (L) 12/02/2018   MCV 97.6 12/02/2018   PLT 74 (L) 12/02/2018      Chemistry      Component Value Date/Time   NA 149 (H) 12/02/2018 1003   NA 140 08/19/2017 1030   K 5.2 (H) 12/02/2018 1003   K 4.6 08/19/2017 1030   CL 114 (H) 12/02/2018 1003   CL 103 02/24/2013 0923   CO2 23 12/02/2018 1003   CO2 27 08/19/2017 1030   BUN 84 (H) 12/02/2018 1003    BUN 20.9 08/19/2017 1030   CREATININE 4.95 (HH) 12/02/2018 1003   CREATININE 1.4 (H) 08/19/2017 1030      Component Value Date/Time   CALCIUM 8.8 (L) 12/02/2018 1003   CALCIUM 9.1 08/19/2017 1030   ALKPHOS 159 (H) 12/02/2018 1003   ALKPHOS 89 08/19/2017 1030   AST 20 12/02/2018 1003   AST 16 08/19/2017 1030   ALT 17 12/02/2018 1003   ALT 10 08/19/2017 1030   BILITOT 0.8 12/02/2018 1003   BILITOT 1.19 08/19/2017 1030      All questions were answered. The patient knows to call the clinic with any problems, questions or concerns. No barriers to learning was detected.  I spent 40 minutes counseling the patient face to face. The total time spent in the appointment was 55 minutes and more than 50% was on counseling and review of test results  Heath Lark, MD 12/02/2018 11:40 AM

## 2018-12-02 NOTE — Telephone Encounter (Signed)
Called referral and faxed to [phone].

## 2018-12-02 NOTE — Assessment & Plan Note (Signed)
I have a long discussion with his wife as well as his case manager I recommend not to pursue further imaging study or biopsy or treatment for him We discussed prognosis In general, without treatment, his prognosis is less than 6 months In his situation, with significant signs of renal failure, estimated his prognosis to be less than 30 days The patient has advanced directive and his CODE STATUS is DNR His wife does not want him to pursue hemodialysis or feeding tube as life-sustaining measures

## 2018-12-03 ENCOUNTER — Telehealth: Payer: Self-pay

## 2018-12-03 NOTE — Telephone Encounter (Signed)
[facility] called to verify Morphine Rx. Asking if directions are correct and insurance is requiring prior authorization for 30 day supply. Insurance will give a 7 day supply without prior authorization.  Called back. Per Dr. Alvy Bimler, directions are correct and give a 7 day supply.

## 2018-12-27 DEATH — deceased
